# Patient Record
Sex: Female | Born: 1965 | Race: White | Hispanic: No | Smoking: Current every day smoker
Health system: Southern US, Community
[De-identification: ages and names within clinical notes are randomized; demographics above are authoritative.]

## PROBLEM LIST (undated history)

## (undated) DIAGNOSIS — K589 Irritable bowel syndrome without diarrhea: Secondary | ICD-10-CM

## (undated) DIAGNOSIS — J449 Chronic obstructive pulmonary disease, unspecified: Secondary | ICD-10-CM

## (undated) DIAGNOSIS — F32A Depression, unspecified: Secondary | ICD-10-CM

## (undated) DIAGNOSIS — F329 Major depressive disorder, single episode, unspecified: Secondary | ICD-10-CM

## (undated) DIAGNOSIS — B029 Zoster without complications: Secondary | ICD-10-CM

## (undated) DIAGNOSIS — R569 Unspecified convulsions: Secondary | ICD-10-CM

## (undated) DIAGNOSIS — F419 Anxiety disorder, unspecified: Secondary | ICD-10-CM

## (undated) DIAGNOSIS — IMO0002 Reserved for concepts with insufficient information to code with codable children: Secondary | ICD-10-CM

## (undated) DIAGNOSIS — B977 Papillomavirus as the cause of diseases classified elsewhere: Secondary | ICD-10-CM

## (undated) HISTORY — DX: Zoster without complications: B02.9

## (undated) HISTORY — PX: ABDOMINAL HYSTERECTOMY: SHX81

## (undated) HISTORY — DX: Papillomavirus as the cause of diseases classified elsewhere: B97.7

## (undated) HISTORY — DX: Reserved for concepts with insufficient information to code with codable children: IMO0002

## (undated) HISTORY — DX: Unspecified convulsions: R56.9

## (undated) HISTORY — DX: Irritable bowel syndrome, unspecified: K58.9

---

## 1986-04-11 HISTORY — PX: TUBAL LIGATION: SHX77

## 2000-08-20 ENCOUNTER — Encounter: Payer: Self-pay | Admitting: *Deleted

## 2000-08-20 ENCOUNTER — Emergency Department (HOSPITAL_COMMUNITY): Admission: EM | Admit: 2000-08-20 | Discharge: 2000-08-20 | Payer: Self-pay | Admitting: *Deleted

## 2001-03-20 ENCOUNTER — Encounter: Payer: Self-pay | Admitting: Family Medicine

## 2001-03-20 ENCOUNTER — Ambulatory Visit (HOSPITAL_COMMUNITY): Admission: RE | Admit: 2001-03-20 | Discharge: 2001-03-20 | Payer: Self-pay | Admitting: Family Medicine

## 2004-02-10 DIAGNOSIS — B977 Papillomavirus as the cause of diseases classified elsewhere: Secondary | ICD-10-CM

## 2004-02-10 HISTORY — DX: Papillomavirus as the cause of diseases classified elsewhere: B97.7

## 2004-03-10 ENCOUNTER — Ambulatory Visit (HOSPITAL_COMMUNITY): Admission: RE | Admit: 2004-03-10 | Discharge: 2004-03-10 | Payer: Self-pay | Admitting: Family Medicine

## 2004-04-11 HISTORY — PX: PARTIAL HYSTERECTOMY: SHX80

## 2004-06-16 ENCOUNTER — Ambulatory Visit (HOSPITAL_COMMUNITY): Admission: RE | Admit: 2004-06-16 | Discharge: 2004-06-16 | Payer: Self-pay | Admitting: Family Medicine

## 2004-06-30 ENCOUNTER — Ambulatory Visit: Payer: Self-pay | Admitting: Psychology

## 2004-07-06 ENCOUNTER — Other Ambulatory Visit: Admission: RE | Admit: 2004-07-06 | Discharge: 2004-07-06 | Payer: Self-pay | Admitting: Obstetrics and Gynecology

## 2004-08-19 ENCOUNTER — Ambulatory Visit: Payer: Self-pay | Admitting: Psychology

## 2004-08-27 ENCOUNTER — Other Ambulatory Visit: Admission: RE | Admit: 2004-08-27 | Discharge: 2004-08-27 | Payer: Self-pay | Admitting: Obstetrics and Gynecology

## 2004-10-25 ENCOUNTER — Ambulatory Visit: Payer: Self-pay | Admitting: Psychology

## 2004-12-01 ENCOUNTER — Ambulatory Visit: Payer: Self-pay | Admitting: Psychology

## 2004-12-23 ENCOUNTER — Inpatient Hospital Stay (HOSPITAL_COMMUNITY): Admission: RE | Admit: 2004-12-23 | Discharge: 2004-12-24 | Payer: Self-pay | Admitting: Obstetrics and Gynecology

## 2004-12-23 ENCOUNTER — Encounter: Payer: Self-pay | Admitting: Obstetrics and Gynecology

## 2005-02-11 ENCOUNTER — Ambulatory Visit: Payer: Self-pay | Admitting: Psychology

## 2005-03-14 ENCOUNTER — Ambulatory Visit: Payer: Self-pay | Admitting: Psychology

## 2005-05-03 ENCOUNTER — Ambulatory Visit: Payer: Self-pay | Admitting: Psychology

## 2006-03-15 ENCOUNTER — Emergency Department (HOSPITAL_COMMUNITY): Admission: EM | Admit: 2006-03-15 | Discharge: 2006-03-15 | Payer: Self-pay | Admitting: Emergency Medicine

## 2009-08-25 ENCOUNTER — Ambulatory Visit (HOSPITAL_COMMUNITY): Admission: RE | Admit: 2009-08-25 | Discharge: 2009-08-25 | Payer: Self-pay | Admitting: Family Medicine

## 2010-08-02 ENCOUNTER — Other Ambulatory Visit (HOSPITAL_COMMUNITY): Payer: Self-pay | Admitting: Family Medicine

## 2010-08-02 DIAGNOSIS — Z139 Encounter for screening, unspecified: Secondary | ICD-10-CM

## 2010-08-09 ENCOUNTER — Ambulatory Visit (HOSPITAL_COMMUNITY)
Admission: RE | Admit: 2010-08-09 | Discharge: 2010-08-09 | Disposition: A | Discharge status: Home / Self Care | Payer: Self-pay | Source: Ambulatory Visit | Attending: Family Medicine | Admitting: Family Medicine

## 2010-08-09 DIAGNOSIS — Z139 Encounter for screening, unspecified: Secondary | ICD-10-CM

## 2010-08-27 NOTE — Discharge Summary (Signed)
[name], [name]              ACCOUNT NO.:  [id number]   MEDICAL RECORD NO.:  [id number]          PATIENT TYPE:  INP   LOCATION:  A428                          FACILITY:  [facility]   PHYSICIAN:  Tilda Burrow, M.D. DATE OF BIRTH:  [birth year]   DATE OF ADMISSION:  12/23/2004  DATE OF DISCHARGE:  09/15/2006LH                                 DISCHARGE SUMMARY   PREOPERATIVE DIAGNOSIS:  Residual cervical dysplasia, endocervical  involvement.   DISCHARGE DIAGNOSES:  Residual cervical dysplasia, endocervical involvement.   PROCEDURES:  Vaginal hysterectomy.   DISCHARGE MEDICATIONS:  1.  Tylox 1 p.o. q.4 h p.r.n. pain.  2.  Colace 1 p.o. b.i.d. x30 days.   HOSPITAL SUMMARY:  This 45 year old female was admitted for vaginal  hysterectomy due to persistent endocervical dysplasia after LEEP conization  of the cervix.  The patient underwent vaginal hysterectomy with 75 cc blood  loss as described in the operative not of December 23, 2004.  Pathology  report showed low grade dysplasia only of the endocervical sample.  The  patient was afebrile, tolerated regular diet with adequate pain relief to go  home on postoperative day #1.  Routine post surgical instructions included  no driving x2 weeks, no lifting over 15 pounds and follow up period, fever,  temperature, deterioration of condition.      Tilda Burrow, M.D.  Electronically Signed     JVF/MEDQ  D:  12/30/2004  T:  12/31/2004  Job:  [id number]   cc:   Lorin Picket A. Gerda Diss, MD  Fax: [phone]

## 2010-08-27 NOTE — H&P (Signed)
[name], [name]              ACCOUNT NO.:  [id number]   MEDICAL RECORD NO.:  [id number]          PATIENT TYPE:  AMB   LOCATION:  DAY                           FACILITY:  [facility]   PHYSICIAN:  Tilda Burrow, M.D. DATE OF BIRTH:  19-Aug-1965   DATE OF ADMISSION:  DATE OF DISCHARGE:  LH                                HISTORY & PHYSICAL   ADMITTING DIAGNOSIS:  Recurrent cervical dysplasia, endocervical  involvement.   HISTORY OF PRESENT ILLNESS:  This 45 year old female is admitted at this  time for a vaginal hysterectomy due to persistent cervical dysplasia.  The  patient has had a prior conization with evaluation in our office showing  persistent abnormality at this time.  The dysplasia persists and goes up the  endocervical canal, and wound necessitate a deeper excision of the cervix.  She had originally undergone a LEEP conization in the office earlier this  year, but despite a specimen measured at 2 cm x 1.7 cm in diameter, the  endocervical involvement was noted on the specimen.  The patient has been  seen back and confirmed that this dysplasia still exists and was moderate  dysplasia on prior evaluation.  Discussion of options of proceeding to  repeat conization versus hysterectomy with the patient strongly desirous of  hysterectomy as her solution.  She complains of cramps with her periods, not  sufficient to her require missing work.  She has a high apprehension of  cancer due to family history.  Significant other family history is that the  mother died at [facility] during back surgery in 2003 due to an  intraoperative accident with an artificial disk was described as being  inserted too deep and tearing the vena cava.  The patient is naturally  apprehensive about the anesthetic aspects of what we are doing.  I have  drawn diagrams of the anatomy to help her understand the surgery and the  risks and benefits.   PAST MEDICAL HISTORY:  Benign.   SURGICAL HISTORY:   Tubal ligation in 1988.   ALLERGIES:  AMPICILLIN.   MEDICATIONS:  1.  Celexa daily.  2.  Xanax p.r.n.   PHYSICAL EXAMINATION:  VITAL SIGNS:  Height 5 feet, 5 inches, weight 103,  blood pressure 102/60.  GENERAL:  A slim Caucasian female, alert and oriented x3.  Pupils equal,  round and reactive to light.  Extraocular movements intact.  NECK:  Supple.  CHEST:  Clear to auscultation.  ABDOMEN:  Nontender.  PELVIC:  External genitalia multiparous.  Vaginal exam - short vaginal  length.  Cervix easily accessible.  Felt amenable to hysterectomy per  vagina, and the uterus is mobile, normal size, shape, and contour.  Adnexa  negative for masses.   PLAN:  Vaginal hysterectomy without removal of ovaries to be performed  December 23, 2004.   Risks of proceed including infection, bleeding, potential injury to adjacent  organs were all discussed at length with the patient using visual guiders by  [employer].  The patient understands and acknowledges understanding.      Tilda Burrow, M.D.  Electronically Signed  JVF/MEDQ  D:  12/22/2004  T:  12/22/2004  Job:  [id number]   cc:   Lorin Picket A. Gerda Diss, MD  [address], [address]  [place]  [place] [address]  Fax: [phone]

## 2010-08-27 NOTE — Op Note (Signed)
[name], [name]              ACCOUNT NO.:  [id number]   MEDICAL RECORD NO.:  [id number]          PATIENT TYPE:  INP   LOCATION:  A428                          FACILITY:  [facility]   PHYSICIAN:  Tilda Burrow, M.D. DATE OF BIRTH:  Apr 02, 1966   DATE OF PROCEDURE:  12/23/2004  DATE OF DISCHARGE:                                 OPERATIVE REPORT   PREOPERATIVE DIAGNOSIS:  Residual cervical dysplasia, endocervical  involvement.   POSTOPERATIVE DIAGNOSIS:  1.  Residual cervical dysplasia, endocervical involvement.  2.  Left ovarian hemorrhagic cyst, considered benign.   PROCEDURE:  Vaginal hysterectomy.   SURGEON:  Tilda Burrow, M.D.   ASSISTANTMarlinda Mike, RN. ___________, CST.  [place]well, CST.   ANESTHESIA:  General.   SPECIMENS:  Uterus to pathology.   ESTIMATED BLOOD LOSS:  75 cc.   COMPLICATIONS:  None.   FINDINGS:  Excellent hemostasis. Hemorrhagic cyst, left ovary.   DETAILS OF PROCEDURE:  The patient was taken to the operating room, prepped  and draped for vaginal work. Cervix was grasped with a Lahey thyroid  tenaculum. The 3-inch vaginal weighted speculum placed in the vagina and the  cervix circumscribed with Bovie cautery using cutting settings and then the  bladder elevated anteriorly. Cervix was rather elongate, so we were not able  to actually reach the peritoneum anteriorly. Posterior colpotomy incision  was made, again making note of the very long cervix. Entry to the peritoneal  cavity was well placed, and then a curved Heaney clamp placed over the left  uterosacral ligaments with cutting and suture ligating performed and tagging  the specimen for future identification. The lower cardinal ligaments on  either side, we were able to readdress the bladder flap and identify the  lower most portion of the vesicouterine narrowing. We were able to march up  the upper cardinal ligament and broad ligament, taking small bites with  straight Zeppelin clamp, Mayo  scissors transection, and 0 chromic suture  ligature. Once we were into the broad ligament, the cervix was amputated off  and the specimen tagged at 12 o'clock.   The upper body of the uterus was serially clamped, cut, and suture ligated  using 0 chromic after Mayo scissor transection. Once we reached the cornual  area, the ligament pedicle could be clamped, cut and suture ligated on  either side. The specimen was passed off. The pedicles were inspected and  found to be hemostatic except for a small bit of oozing in the area of the  lower cardinal ligament on the left side which we had sutured.   The pelvic structures were visualized through the opening, and no  endometriosis could be identified. There was a small, barely attached,  formally free floating body that had blood in it such as epithelized old  clot.   We then proceeded to inspect the ovaries visually. The right ovary was well  retracted; large, hemorrhagic cyst on its surface approximately 3 cm in  diameter which was opened sufficiently to expel which was old, dark in  character and vac suctioned out. Ovary was decompressed to normal size.  The  possibility that this represents an endometrioma exists, there was no clear  way to know. The absence of uterosacral ligament endometriosis was noted.   The peritoneum was reapproximated front to back on either side, closing the  peritoneal cavity with sponge and needle counts correct. Vaginal cuff was  then closed using running locking 2-0 chromic with good tissue edge  approximation, and patient then was allowed to go to recovery room in good  condition. Foley catheter was inserted prior to leaving the area. A vaginal  pack was not necessary. The patient tolerated the procedure well with  minimal blood loss of 75 cc.      Tilda Burrow, M.D.  Electronically Signed     JVF/MEDQ  D:  12/23/2004  T:  12/23/2004  Job:  [id number]   cc:   [facility]   Scott A. Gerda Diss,  MD  [address], [address]  [place]  [place] [address]  Fax: [phone]

## 2011-01-21 ENCOUNTER — Other Ambulatory Visit: Payer: Self-pay

## 2011-01-21 ENCOUNTER — Encounter: Payer: Self-pay | Admitting: Oncology

## 2011-01-21 ENCOUNTER — Emergency Department (HOSPITAL_COMMUNITY)
Admission: EM | Admit: 2011-01-21 | Discharge: 2011-01-21 | Disposition: A | Discharge status: Home / Self Care | Payer: Self-pay | Attending: Emergency Medicine | Admitting: Emergency Medicine

## 2011-01-21 DIAGNOSIS — W868XXA Exposure to other electric current, initial encounter: Secondary | ICD-10-CM | POA: Insufficient documentation

## 2011-01-21 DIAGNOSIS — T2036XA Burn of third degree of forehead and cheek, initial encounter: Secondary | ICD-10-CM | POA: Insufficient documentation

## 2011-01-21 DIAGNOSIS — T23329A Burn of third degree of unspecified single finger (nail) except thumb, initial encounter: Secondary | ICD-10-CM | POA: Insufficient documentation

## 2011-01-21 DIAGNOSIS — IMO0002 Reserved for concepts with insufficient information to code with codable children: Secondary | ICD-10-CM

## 2011-01-21 DIAGNOSIS — Y92009 Unspecified place in unspecified non-institutional (private) residence as the place of occurrence of the external cause: Secondary | ICD-10-CM | POA: Insufficient documentation

## 2011-01-21 HISTORY — DX: Chronic obstructive pulmonary disease, unspecified: J44.9

## 2011-01-21 HISTORY — DX: Major depressive disorder, single episode, unspecified: F32.9

## 2011-01-21 HISTORY — DX: Depression, unspecified: F32.A

## 2011-01-21 HISTORY — DX: Anxiety disorder, unspecified: F41.9

## 2011-01-21 LAB — DIFFERENTIAL
Basophils Relative: 0 % (ref 0–1)
Eosinophils Absolute: 0.1 10*3/uL (ref 0.0–0.7)
Lymphocytes Relative: 15 % (ref 12–46)
Lymphs Abs: 1.8 10*3/uL (ref 0.7–4.0)
Monocytes Absolute: 1.1 10*3/uL — ABNORMAL HIGH (ref 0.1–1.0)
Monocytes Relative: 9 % (ref 3–12)
Neutro Abs: 9.4 10*3/uL — ABNORMAL HIGH (ref 1.7–7.7)
Neutrophils Relative %: 76 % (ref 43–77)

## 2011-01-21 LAB — CBC
HCT: 35.7 % — ABNORMAL LOW (ref 36.0–46.0)
Hemoglobin: 12.2 g/dL (ref 12.0–15.0)
MCH: 33.5 pg (ref 26.0–34.0)
MCV: 98.1 fL (ref 78.0–100.0)
RBC: 3.64 MIL/uL — ABNORMAL LOW (ref 3.87–5.11)
RDW: 12.3 % (ref 11.5–15.5)
WBC: 12.4 10*3/uL — ABNORMAL HIGH (ref 4.0–10.5)

## 2011-01-21 LAB — BASIC METABOLIC PANEL
BUN: 14 mg/dL (ref 6–23)
Creatinine, Ser: 0.69 mg/dL (ref 0.50–1.10)
Potassium: 3.4 mEq/L — ABNORMAL LOW (ref 3.5–5.1)

## 2011-01-21 MED ORDER — SODIUM CHLORIDE 0.9 % IJ SOLN: 3.0000 mL | INTRAMUSCULAR | Status: DC | PRN

## 2011-01-21 MED ORDER — SODIUM CHLORIDE 0.9 % IV SOLN: Freq: Once | INTRAVENOUS | Status: DC
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
## 2012-01-23 ENCOUNTER — Other Ambulatory Visit: Payer: Self-pay | Admitting: Family Medicine

## 2012-01-23 DIAGNOSIS — R200 Anesthesia of skin: Secondary | ICD-10-CM

## 2012-01-30 ENCOUNTER — Ambulatory Visit (HOSPITAL_COMMUNITY)
Admission: RE | Admit: 2012-01-30 | Discharge: 2012-01-30 | Disposition: A | Discharge status: Home / Self Care | Payer: BC Managed Care – PPO | Source: Ambulatory Visit | Attending: Family Medicine | Admitting: Family Medicine

## 2012-01-30 DIAGNOSIS — R200 Anesthesia of skin: Secondary | ICD-10-CM

## 2012-01-30 DIAGNOSIS — R209 Unspecified disturbances of skin sensation: Secondary | ICD-10-CM | POA: Insufficient documentation

## 2012-05-02 ENCOUNTER — Ambulatory Visit (INDEPENDENT_AMBULATORY_CARE_PROVIDER_SITE_OTHER): Payer: BC Managed Care – PPO | Admitting: Psychology

## 2012-05-02 DIAGNOSIS — F332 Major depressive disorder, recurrent severe without psychotic features: Secondary | ICD-10-CM

## 2012-05-22 ENCOUNTER — Encounter (HOSPITAL_COMMUNITY): Payer: Self-pay | Admitting: Psychology
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
## 2012-05-23 ENCOUNTER — Ambulatory Visit (HOSPITAL_COMMUNITY): Payer: Self-pay | Admitting: Psychology

## 2012-05-30 ENCOUNTER — Other Ambulatory Visit (HOSPITAL_COMMUNITY): Payer: Self-pay

## 2012-05-30 DIAGNOSIS — R0689 Other abnormalities of breathing: Secondary | ICD-10-CM

## 2012-05-31 ENCOUNTER — Ambulatory Visit (HOSPITAL_COMMUNITY): Payer: Self-pay | Admitting: Psychology

## 2012-06-05 ENCOUNTER — Encounter (HOSPITAL_COMMUNITY): Payer: Self-pay | Admitting: Psychology

## 2012-06-05 ENCOUNTER — Ambulatory Visit (INDEPENDENT_AMBULATORY_CARE_PROVIDER_SITE_OTHER): Payer: BC Managed Care – PPO | Admitting: Psychology

## 2012-06-05 DIAGNOSIS — F332 Major depressive disorder, recurrent severe without psychotic features: Secondary | ICD-10-CM
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
## 2012-06-12 ENCOUNTER — Ambulatory Visit (HOSPITAL_COMMUNITY)
Admission: RE | Admit: 2012-06-12 | Discharge: 2012-06-12 | Disposition: A | Discharge status: Home / Self Care | Payer: BC Managed Care – PPO | Source: Ambulatory Visit | Attending: Family Medicine | Admitting: Family Medicine

## 2012-06-12 DIAGNOSIS — R0602 Shortness of breath: Secondary | ICD-10-CM | POA: Insufficient documentation

## 2012-06-12 MED ORDER — ALBUTEROL SULFATE (5 MG/ML) 0.5% IN NEBU
2.5000 mg | INHALATION_SOLUTION | Freq: Once | RESPIRATORY_TRACT | Status: AC
  Administered 2012-06-12: 2.5 mg via RESPIRATORY_TRACT

## 2012-06-19 ENCOUNTER — Ambulatory Visit (HOSPITAL_COMMUNITY): Payer: Self-pay | Admitting: Psychology

## 2012-06-19 LAB — PULMONARY FUNCTION TEST

## 2012-06-20 ENCOUNTER — Ambulatory Visit (INDEPENDENT_AMBULATORY_CARE_PROVIDER_SITE_OTHER): Payer: BC Managed Care – PPO | Admitting: Psychology

## 2012-06-20 DIAGNOSIS — F332 Major depressive disorder, recurrent severe without psychotic features: Secondary | ICD-10-CM
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
## 2012-06-26 ENCOUNTER — Encounter: Payer: Self-pay | Admitting: Family Medicine

## 2012-06-26 ENCOUNTER — Ambulatory Visit (INDEPENDENT_AMBULATORY_CARE_PROVIDER_SITE_OTHER): Payer: BC Managed Care – PPO | Admitting: Family Medicine

## 2012-06-26 VITALS — BP 130/86 | Temp 98.4°F | Wt 99.6 lb

## 2012-06-26 DIAGNOSIS — R42 Dizziness and giddiness: Secondary | ICD-10-CM

## 2012-06-26 DIAGNOSIS — R634 Abnormal weight loss: Secondary | ICD-10-CM

## 2012-06-26 NOTE — Progress Notes (Signed)
  Subjective:    Patient ID: [name], female    DOB: 1965/10/23, 47 y.o.   MRN: [id number]  HPI This patient presents today stating that she's been having some spells of dizziness. She feels like it to wave that comes across to her. Makes her feel uncertain and unsteady. She states her last a few seconds then gets better. A couple times it happened when she was driving she pulled over. She does not lose consciousness no headache with it no unilateral numbness or weakness. No sweats chills fever vomiting. No headaches. Patient is also been under severe stress. She's been very anxious and feeling very depressed upon and worried. She takes care of her dadand I are in a sterile is no a long he remained in it all and on a regular basis. Patient is under the care of Dr. Minda Meo for depression we have her on Cymbalta she only uses the Xanax at nighttime. Patient does have a history of depression does not have any history of stroke. 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   Review of Systems     Objective:   Physical Exam  Neck: Normal range of motion. No thyromegaly present.  Cardiovascular: Normal rate and regular rhythm.   Pulmonary/Chest: Effort normal and breath sounds normal. No respiratory distress. She has no wheezes.  Abdominal: Soft. There is no tenderness.   Finger to nose is normal EOMI. Negative Romberg. No unilateral numbness or weakness. Patient very tearful but she states she's not suicidal.       Assessment & Plan:  Loss of weight - Plan: T4, free, TSH  Dizziness and giddiness  I believe this patient is suffering mainly with severe symptoms related to her depression and her anxiety. I told her that it's very important that she figure out a way for her day had to be more independent or to have home health or a sitter come in and take care of her dad. If she wants her marriage to work she needs to be with her husband and not being her dad's caretaker when it sounds like  her dad could take care of himself better. Let's see the patient back in 4 months. She may followup sooner problems.

## 2012-06-26 NOTE — Patient Instructions (Signed)
Take your medications Do your lab work

## 2012-06-28 LAB — TSH: TSH: 0.742 u[IU]/mL (ref 0.350–4.500)

## 2012-06-28 NOTE — Progress Notes (Signed)
Quick Note:  Discussed with patient. Patient verbalized understanding. ______

## 2012-07-04 ENCOUNTER — Ambulatory Visit (INDEPENDENT_AMBULATORY_CARE_PROVIDER_SITE_OTHER): Payer: BC Managed Care – PPO | Admitting: Psychology

## 2012-07-04 DIAGNOSIS — F332 Major depressive disorder, recurrent severe without psychotic features: Secondary | ICD-10-CM

## 2012-07-09 ENCOUNTER — Encounter (HOSPITAL_COMMUNITY): Payer: Self-pay | Admitting: Psychology
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
## 2012-07-18 ENCOUNTER — Encounter (HOSPITAL_COMMUNITY): Payer: Self-pay | Admitting: Psychology
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
## 2012-07-31 ENCOUNTER — Encounter: Payer: Self-pay | Admitting: *Deleted

## 2012-08-01 ENCOUNTER — Ambulatory Visit: Payer: BC Managed Care – PPO | Admitting: Family Medicine

## 2012-08-20 ENCOUNTER — Ambulatory Visit (INDEPENDENT_AMBULATORY_CARE_PROVIDER_SITE_OTHER): Payer: BC Managed Care – PPO | Admitting: Psychology

## 2012-08-20 DIAGNOSIS — F332 Major depressive disorder, recurrent severe without psychotic features: Secondary | ICD-10-CM

## 2012-09-07 ENCOUNTER — Encounter (HOSPITAL_COMMUNITY): Payer: Self-pay | Admitting: Psychology
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
## 2012-09-17 ENCOUNTER — Ambulatory Visit (HOSPITAL_COMMUNITY): Payer: Self-pay | Admitting: Psychology

## 2012-09-21 ENCOUNTER — Ambulatory Visit (INDEPENDENT_AMBULATORY_CARE_PROVIDER_SITE_OTHER): Payer: BC Managed Care – PPO | Admitting: Psychology

## 2012-09-21 DIAGNOSIS — F332 Major depressive disorder, recurrent severe without psychotic features: Secondary | ICD-10-CM

## 2012-10-15 ENCOUNTER — Other Ambulatory Visit: Payer: Self-pay | Admitting: Family Medicine

## 2012-10-15 NOTE — Telephone Encounter (Signed)
Refill times one, will need further follow up before more

## 2012-10-22 ENCOUNTER — Ambulatory Visit (INDEPENDENT_AMBULATORY_CARE_PROVIDER_SITE_OTHER): Payer: BC Managed Care – PPO | Admitting: Family Medicine

## 2012-10-22 ENCOUNTER — Ambulatory Visit (HOSPITAL_COMMUNITY): Payer: Self-pay | Admitting: Psychology

## 2012-10-22 ENCOUNTER — Encounter: Payer: Self-pay | Admitting: Family Medicine

## 2012-10-22 VITALS — BP 110/70 | HR 80 | Ht 67.0 in | Wt 101.2 lb

## 2012-10-22 DIAGNOSIS — F411 Generalized anxiety disorder: Secondary | ICD-10-CM

## 2012-10-22 DIAGNOSIS — F419 Anxiety disorder, unspecified: Secondary | ICD-10-CM | POA: Insufficient documentation

## 2012-10-22 DIAGNOSIS — R42 Dizziness and giddiness: Secondary | ICD-10-CM | POA: Insufficient documentation

## 2012-10-22 MED ORDER — ALPRAZOLAM 0.5 MG PO TABS: ORAL_TABLET | ORAL | Status: DC

## 2012-10-22 NOTE — Progress Notes (Signed)
  Subjective:    Patient ID: [name], female    DOB: [birth year], 47 y.o.   MRN: [id number]  Anxiety Presents for follow-up visit. Symptoms include dizziness. Patient reports no chest pain or shortness of breath. Symptoms occur occasionally. The severity of symptoms is moderate. The quality of sleep is fair.   Compliance with medications is 76-100%.   patient's intermittent dizziness comes and goes she feels off-balance she denies any unilateral numbness or weakness denies any muscle weakness denies any headaches with it. Does not eat properly. Encouraged eating Still smokes- trying to quit. Gets dizzy at times. She doesn't eat well. Very busy with work and taking care of her dad. She lives with him.  Review of Systems  Constitutional: Negative for fever, chills and fatigue.  HENT: Negative for congestion.   Respiratory: Negative for choking and shortness of breath.   Cardiovascular: Negative for chest pain.  Neurological: Positive for dizziness.       Objective:   Physical Exam  Vitals reviewed. Constitutional: She appears well-developed.  thin  HENT:  Head: Normocephalic and atraumatic.  Neck: No thyromegaly present.  Cardiovascular: Normal rate, regular rhythm and normal heart sounds.   Pulmonary/Chest: Effort normal and breath sounds normal.  Lymphadenopathy:    She has no cervical adenopathy.          Assessment & Plan:  Anxiety- continue meds as is f/u in 5 months Dietary poor, eat better Quit smoking Dizziness-I. think this is a byproduct of not eating properly could be related to her anxiety. There is no findings of any unilateral aspect consistent with a stroke

## 2012-10-22 NOTE — Patient Instructions (Signed)
Smoking Cessation Quitting smoking is important to your health and has many advantages. However, it is not always easy to quit since nicotine is a very addictive drug. Often times, people try 3 times or more before being able to quit. This document explains the best ways for you to prepare to quit smoking. Quitting takes hard work and a lot of effort, but you can do it. ADVANTAGES OF QUITTING SMOKING  You will live longer, feel better, and live better.  Your body will feel the impact of quitting smoking almost immediately.  Within 20 minutes, blood pressure decreases. Your pulse returns to its normal level.  After 8 hours, carbon monoxide levels in the blood return to normal. Your oxygen level increases.  After 24 hours, the chance of having a heart attack starts to decrease. Your breath, hair, and body stop smelling like smoke.  After 48 hours, damaged nerve endings begin to recover. Your sense of taste and smell improve.  After 72 hours, the body is virtually free of nicotine. Your bronchial tubes relax and breathing becomes easier.  After 2 to 12 weeks, lungs can hold more air. Exercise becomes easier and circulation improves.  The risk of having a heart attack, stroke, cancer, or lung disease is greatly reduced.  After 1 year, the risk of coronary heart disease is cut in half.  After 5 years, the risk of stroke falls to the same as a nonsmoker.  After 10 years, the risk of lung cancer is cut in half and the risk of other cancers decreases significantly.  After 15 years, the risk of coronary heart disease drops, usually to the level of a nonsmoker.  If you are pregnant, quitting smoking will improve your chances of having a healthy baby.  The people you live with, especially any children, will be healthier.  You will have extra money to spend on things other than cigarettes. QUESTIONS TO THINK ABOUT BEFORE ATTEMPTING TO QUIT You may want to talk about your answers with your  caregiver.  Why do you want to quit?  If you tried to quit in the past, what helped and what did not?  What will be the most difficult situations for you after you quit? How will you plan to handle them?  Who can help you through the tough times? Your family? Friends? A caregiver?  What pleasures do you get from smoking? What ways can you still get pleasure if you quit? Here are some questions to ask your caregiver:  How can you help me to be successful at quitting?  What medicine do you think would be best for me and how should I take it?  What should I do if I need more help?  What is smoking withdrawal like? How can I get information on withdrawal? GET READY  Set a quit date.  Change your environment by getting rid of all cigarettes, ashtrays, matches, and lighters in your home, car, or work. Do not let people smoke in your home.  Review your past attempts to quit. Think about what worked and what did not. GET SUPPORT AND ENCOURAGEMENT You have a better chance of being successful if you have help. You can get support in many ways.  Tell your family, friends, and co-workers that you are going to quit and need their support. Ask them not to smoke around you.  Get individual, group, or telephone counseling and support. Programs are available at local hospitals and health centers. Call your local health department for   information about programs in your area.  Spiritual beliefs and practices may help some smokers quit.  Download a "quit meter" on your computer to keep track of quit statistics, such as how long you have gone without smoking, cigarettes not smoked, and money saved.  Get a self-help book about quitting smoking and staying off of tobacco. LEARN NEW SKILLS AND BEHAVIORS  Distract yourself from urges to smoke. Talk to someone, go for a walk, or occupy your time with a task.  Change your normal routine. Take a different route to work. Drink tea instead of coffee.  Eat breakfast in a different place.  Reduce your stress. Take a hot bath, exercise, or read a book.  Plan something enjoyable to do every day. Reward yourself for not smoking.  Explore interactive web-based programs that specialize in helping you quit. GET MEDICINE AND USE IT CORRECTLY Medicines can help you stop smoking and decrease the urge to smoke. Combining medicine with the above behavioral methods and support can greatly increase your chances of successfully quitting smoking.  Nicotine replacement therapy helps deliver nicotine to your body without the negative effects and risks of smoking. Nicotine replacement therapy includes nicotine gum, lozenges, inhalers, nasal sprays, and skin patches. Some may be available over-the-counter and others require a prescription.  Antidepressant medicine helps people abstain from smoking, but how this works is unknown. This medicine is available by prescription.  Nicotinic receptor partial agonist medicine simulates the effect of nicotine in your brain. This medicine is available by prescription. Ask your caregiver for advice about which medicines to use and how to use them based on your health history. Your caregiver will tell you what side effects to look out for if you choose to be on a medicine or therapy. Carefully read the information on the package. Do not use any other product containing nicotine while using a nicotine replacement product.  RELAPSE OR DIFFICULT SITUATIONS Most relapses occur within the first 3 months after quitting. Do not be discouraged if you start smoking again. Remember, most people try several times before finally quitting. You may have symptoms of withdrawal because your body is used to nicotine. You may crave cigarettes, be irritable, feel very hungry, cough often, get headaches, or have difficulty concentrating. The withdrawal symptoms are only temporary. They are strongest when you first quit, but they will go away within  10 14 days. To reduce the chances of relapse, try to:  Avoid drinking alcohol. Drinking lowers your chances of successfully quitting.  Reduce the amount of caffeine you consume. Once you quit smoking, the amount of caffeine in your body increases and can give you symptoms, such as a rapid heartbeat, sweating, and anxiety.  Avoid smokers because they can make you want to smoke.  Do not let weight gain distract you. Many smokers will gain weight when they quit, usually less than 10 pounds. Eat a healthy diet and stay active. You can always lose the weight gained after you quit.  Find ways to improve your mood other than smoking. FOR MORE INFORMATION  www.smokefree.gov  Document Released: 03/22/2001 Document Revised: 09/27/2011 Document Reviewed: 07/07/2011 [employer] Patient Information 2014 [employer].  

## 2012-10-29 ENCOUNTER — Other Ambulatory Visit: Payer: Self-pay

## 2012-10-29 ENCOUNTER — Telehealth: Payer: Self-pay | Admitting: Family Medicine

## 2012-10-29 DIAGNOSIS — R42 Dizziness and giddiness: Secondary | ICD-10-CM

## 2012-10-29 NOTE — Telephone Encounter (Signed)
Pt calling stating she is still having dizzy spells, what do you recommend?

## 2012-10-29 NOTE — Telephone Encounter (Signed)
Notified patient the next step in this process would be considering neurology consultation. Patient agreed. Initiated referral in system.

## 2012-10-29 NOTE — Telephone Encounter (Signed)
The next step in this process would be considering neurology consultation. See if the patient is interested if so we can help set this up.

## 2012-11-02 ENCOUNTER — Ambulatory Visit (INDEPENDENT_AMBULATORY_CARE_PROVIDER_SITE_OTHER): Payer: BC Managed Care – PPO | Admitting: Psychology

## 2012-11-02 DIAGNOSIS — F332 Major depressive disorder, recurrent severe without psychotic features: Secondary | ICD-10-CM

## 2012-11-05 ENCOUNTER — Encounter (HOSPITAL_COMMUNITY): Payer: Self-pay | Admitting: Psychology
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
## 2012-11-28 ENCOUNTER — Ambulatory Visit (HOSPITAL_COMMUNITY): Payer: Self-pay | Admitting: Psychology

## 2012-12-19 ENCOUNTER — Encounter: Payer: Self-pay | Admitting: Nurse Practitioner

## 2012-12-19 ENCOUNTER — Encounter (HOSPITAL_COMMUNITY): Payer: Self-pay | Admitting: Psychology

## 2012-12-19 ENCOUNTER — Ambulatory Visit (INDEPENDENT_AMBULATORY_CARE_PROVIDER_SITE_OTHER): Payer: BC Managed Care – PPO | Admitting: Nurse Practitioner

## 2012-12-19 VITALS — BP 112/68 | Ht 67.0 in | Wt 101.6 lb

## 2012-12-19 DIAGNOSIS — Z0000 Encounter for general adult medical examination without abnormal findings: Secondary | ICD-10-CM

## 2012-12-19 DIAGNOSIS — J449 Chronic obstructive pulmonary disease, unspecified: Secondary | ICD-10-CM

## 2012-12-19 DIAGNOSIS — Z79899 Other long term (current) drug therapy: Secondary | ICD-10-CM

## 2012-12-19 DIAGNOSIS — R5381 Other malaise: Secondary | ICD-10-CM

## 2012-12-19 DIAGNOSIS — Z01419 Encounter for gynecological examination (general) (routine) without abnormal findings: Secondary | ICD-10-CM
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
## 2012-12-19 NOTE — Progress Notes (Signed)
  Subjective:    Patient ID: [name], female    DOB: [birth year], 47 y.o.   MRN: [id number]  HPI presents for wellness checkup. Married, same sexual partner. No vaginal discharge. No pelvic pain. Has been diagnosed with COPD. Does not feel she can stop smoking at this time due to her level of anxiety. Regular dental care. Regular vision care. Has had increased shortness of breath over time. No chest pain or ischemic type pain. Her father has had an MI, several paternal uncles with history of stroke.    Review of Systems  Constitutional: Positive for fatigue. Negative for fever, activity change and appetite change.  HENT: Negative for hearing loss, ear pain, congestion, sore throat, rhinorrhea and dental problem.   Respiratory: Positive for cough and shortness of breath. Negative for chest tightness and wheezing.   Cardiovascular: Negative for chest pain.  Gastrointestinal: Positive for diarrhea and constipation. Negative for nausea, vomiting, abdominal pain and blood in stool.  Genitourinary: Negative for dysuria, urgency, frequency, vaginal discharge, difficulty urinating and pelvic pain.  Neurological: Negative for headaches.  Psychiatric/Behavioral: Positive for dysphoric mood. Negative for sleep disturbance. The patient is nervous/anxious.        Objective:   Physical Exam  Vitals reviewed. Constitutional: She is oriented to person, place, and time. She appears well-developed. No distress.  HENT:  Right Ear: External ear normal.  Left Ear: External ear normal.  Mouth/Throat: Oropharynx is clear and moist.  Neck: Normal range of motion. Neck supple. No tracheal deviation present. No thyromegaly present.  Cardiovascular: Normal rate, regular rhythm and normal heart sounds.  Exam reveals no gallop.   No murmur heard. Pulmonary/Chest: Effort normal and breath sounds normal. No respiratory distress. She has no wheezes.  Abdominal: Soft. She exhibits no distension and no mass. There  is no tenderness.  Genitourinary: Vagina normal. No vaginal discharge found.  Musculoskeletal: She exhibits no edema.  Lymphadenopathy:    She has no cervical adenopathy.  Neurological: She is alert and oriented to person, place, and time.  Skin: Skin is warm and dry. No rash noted.  Psychiatric: She has a normal mood and affect. Her behavior is normal.   Breast exam dense tissue, no dominant masses. Axilla no adenopathy. Lungs sounds clear but mildly diminished. Normal color. No tachypnea. External GU normal, vagina no discharge. Ovaries no masses or tenderness noted.        Assessment & Plan:  Well woman exam  Encounter for long-term (current) use of other medications - Plan: Hepatic function panel, Basic metabolic panel  Routine general medical examination at a health care facility - Plan: MM Digital Screening, Lipid panel  Other malaise and fatigue - Plan: TSH  COPD (chronic obstructive pulmonary disease)  Meds ordered this encounter  Medications  . tiotropium (SPIRIVA) 18 MCG inhalation capsule    Sig: Place 1 capsule (18 mcg total) into inhaler and inhale daily.    Dispense:  30 capsule    Refill:  12    Order Specific Question:  Supervising Provider    Answer:  Merlyn Albert [[id number]]   Discussed importance of smoking cessation although patient will have a difficult time quitting at this point. Recommend vitamin D and calcium daily. Next physical in one year.

## 2012-12-21 ENCOUNTER — Telehealth: Payer: Self-pay | Admitting: Family Medicine

## 2012-12-21 NOTE — Telephone Encounter (Signed)
Patient says that she was supposed to have some medication called in for her from her visit on Wednesday with Eber Jones, but the pharmacy has not yet received anything. She would like someone to call her when this is complete.   [facility]

## 2012-12-23 ENCOUNTER — Encounter: Payer: Self-pay | Admitting: Nurse Practitioner

## 2012-12-23 DIAGNOSIS — J449 Chronic obstructive pulmonary disease, unspecified: Secondary | ICD-10-CM | POA: Insufficient documentation

## 2012-12-23 MED ORDER — TIOTROPIUM BROMIDE MONOHYDRATE 18 MCG IN CAPS: 18.0000 ug | ORAL_CAPSULE | Freq: Every day | RESPIRATORY_TRACT | Status: DC

## 2012-12-23 NOTE — Assessment & Plan Note (Signed)
Discussed importance of smoking cessation, patient feels she cannot quit at this time due to her level of anxiety. Started on the Spiriva daily as directed.

## 2012-12-23 NOTE — Telephone Encounter (Signed)
Spiriva sent in to [facility].  Use daily as directed.  Call back in a few weeks if no improvement in her breathing.  Otherwise, follow-up in 6 months.

## 2012-12-24 NOTE — Telephone Encounter (Signed)
Patient notified

## 2012-12-25 LAB — HEPATIC FUNCTION PANEL
ALT: 8 U/L (ref 0–35)
AST: 14 U/L (ref 0–37)
Alkaline Phosphatase: 36 U/L — ABNORMAL LOW (ref 39–117)
Total Protein: 6.5 g/dL (ref 6.0–8.3)

## 2012-12-25 LAB — BASIC METABOLIC PANEL
CO2: 30 mEq/L (ref 19–32)
Calcium: 9.3 mg/dL (ref 8.4–10.5)
Chloride: 103 mEq/L (ref 96–112)
Potassium: 4.2 mEq/L (ref 3.5–5.3)

## 2012-12-25 LAB — LIPID PANEL
Triglycerides: 109 mg/dL (ref <150)
VLDL: 22 mg/dL (ref 0–40)

## 2012-12-26 ENCOUNTER — Encounter: Payer: Self-pay | Admitting: Nurse Practitioner

## 2012-12-26 LAB — TSH: TSH: 1.758 u[IU]/mL (ref 0.350–4.500)

## 2013-01-07 ENCOUNTER — Ambulatory Visit (HOSPITAL_COMMUNITY): Payer: Self-pay

## 2013-01-15 ENCOUNTER — Ambulatory Visit (HOSPITAL_COMMUNITY): Payer: Self-pay | Admitting: Psychology

## 2013-01-17 ENCOUNTER — Encounter (HOSPITAL_COMMUNITY): Payer: Self-pay | Admitting: Psychology

## 2013-01-21 ENCOUNTER — Ambulatory Visit (INDEPENDENT_AMBULATORY_CARE_PROVIDER_SITE_OTHER): Payer: BC Managed Care – PPO | Admitting: Nurse Practitioner

## 2013-01-21 ENCOUNTER — Encounter: Payer: Self-pay | Admitting: Nurse Practitioner

## 2013-01-21 VITALS — BP 112/78 | HR 80 | Ht 67.0 in | Wt 100.4 lb

## 2013-01-21 DIAGNOSIS — N949 Unspecified condition associated with female genital organs and menstrual cycle: Secondary | ICD-10-CM

## 2013-01-21 DIAGNOSIS — R102 Pelvic and perineal pain: Secondary | ICD-10-CM

## 2013-01-21 DIAGNOSIS — R300 Dysuria: Secondary | ICD-10-CM

## 2013-01-21 LAB — POCT URINALYSIS DIPSTICK
Spec Grav, UA: 1.015
Urobilinogen, UA: 0.2

## 2013-01-22 LAB — GC/CHLAMYDIA PROBE AMP
CT Probe RNA: NEGATIVE
GC Probe RNA: NEGATIVE

## 2013-01-23 ENCOUNTER — Encounter: Payer: Self-pay | Admitting: Nurse Practitioner

## 2013-01-23 LAB — POCT WET PREP WITH KOH
Epithelial Wet Prep HPF POC: NORMAL
KOH Prep POC: NEGATIVE
RBC Wet Prep HPF POC: NEGATIVE
Yeast Wet Prep HPF POC: NEGATIVE
pH: 4.5

## 2013-01-23 LAB — POCT UA - MICROSCOPIC ONLY
Bacteria, U Microscopic: 0
WBC, Ur, HPF, POC: 0

## 2013-01-23 LAB — URINE CULTURE: Organism ID, Bacteria: NO GROWTH

## 2013-01-23 NOTE — Progress Notes (Signed)
Subjective:  Presents complaints of irritation and burning in the vaginal area for the past few days. No fever. Some left lower abdominal pain. Some constipation, no diarrhea. No dysuria. No urgency or frequency. 2 days ago had some slight discharge, describes it as "orange in color". Has had one encounter with a new partner, unprotected sex. Has had a hysterectomy. No nausea vomiting. Mild low back pain at times.  Objective:   BP 112/78  Pulse 80  Ht 5\' 7"  (1.702 m)  Wt 100 lb 6.4 oz (45.541 kg)  BMI 15.72 kg/m2 NAD. Alert, oriented. Lungs clear. No CVA area tenderness. Heart regular rate rhythm. Abdomen soft nondistended with mild tenderness in the left lower quadrant. No tenderness noted in the area of the left ovary. External GU mild irritation, no lesions noted. Vagina a small amount of white mucoid discharge. Vagina no irritation. Ovaries nontender, no masses. Again tenderness more towards the left lower quadrant near the sigmoid colon. Wet prep pH 4.5 with epithelial cells noted, otherwise normal. KOH test negative. Urine dipstick trace blood with moderate leukocytes.  Assessment:Pelvic pain - Plan: POCT urinalysis dipstick, GC/Chlamydia Probe Amp, Urine culture, POCT UA - Microscopic Only, POCT Wet Prep with KOH  Dysuria - Plan: Urine culture, POCT UA - Microscopic Only  Plan: OTC vaginal anti-yeast yeast cream as directed. Further followup based on test results, patient to call back sooner if symptoms worsen.

## 2013-02-20 ENCOUNTER — Other Ambulatory Visit: Payer: Self-pay | Admitting: *Deleted

## 2013-02-20 ENCOUNTER — Telehealth: Payer: Self-pay | Admitting: Family Medicine

## 2013-02-20 MED ORDER — FLUCONAZOLE 150 MG PO TABS: 150.0000 mg | ORAL_TABLET | Freq: Once | ORAL | Status: DC

## 2013-02-20 NOTE — Telephone Encounter (Signed)
Patient wants something called in for yeast infection to [facility].

## 2013-02-20 NOTE — Telephone Encounter (Signed)
Left message stating I put in the order for Diflucan 150 mg , 1 po times one, 1 refill, and to f/u if ongoing.

## 2013-02-20 NOTE — Telephone Encounter (Signed)
Diflucan 150 mg , 1 po times one, 1 refill , f/u if ongoing

## 2013-02-25 ENCOUNTER — Ambulatory Visit (HOSPITAL_COMMUNITY): Payer: Self-pay | Admitting: Psychology

## 2013-03-18 ENCOUNTER — Ambulatory Visit (INDEPENDENT_AMBULATORY_CARE_PROVIDER_SITE_OTHER): Payer: BC Managed Care – PPO | Admitting: Psychology

## 2013-03-18 DIAGNOSIS — F332 Major depressive disorder, recurrent severe without psychotic features: Secondary | ICD-10-CM

## 2013-03-20 ENCOUNTER — Ambulatory Visit: Payer: BC Managed Care – PPO | Admitting: Obstetrics and Gynecology

## 2013-03-21 ENCOUNTER — Encounter (HOSPITAL_COMMUNITY): Payer: Self-pay | Admitting: Psychiatry

## 2013-03-21 ENCOUNTER — Ambulatory Visit (INDEPENDENT_AMBULATORY_CARE_PROVIDER_SITE_OTHER): Payer: BC Managed Care – PPO | Admitting: Psychiatry

## 2013-03-21 VITALS — BP 108/72 | Ht 67.0 in | Wt 98.0 lb

## 2013-03-21 DIAGNOSIS — F329 Major depressive disorder, single episode, unspecified: Secondary | ICD-10-CM

## 2013-03-21 DIAGNOSIS — F332 Major depressive disorder, recurrent severe without psychotic features: Secondary | ICD-10-CM

## 2013-03-21 DIAGNOSIS — F411 Generalized anxiety disorder: Secondary | ICD-10-CM

## 2013-03-21 MED ORDER — MIRTAZAPINE 15 MG PO TABS: 15.0000 mg | ORAL_TABLET | Freq: Every day | ORAL | Status: DC
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
## 2013-03-22 ENCOUNTER — Telehealth (HOSPITAL_COMMUNITY): Payer: Self-pay | Admitting: Psychiatry

## 2013-03-22 NOTE — Telephone Encounter (Signed)
She meant mirtazapine and it is ok to take with other meds

## 2013-03-25 ENCOUNTER — Encounter: Payer: Self-pay | Admitting: Obstetrics and Gynecology

## 2013-03-25 ENCOUNTER — Ambulatory Visit (INDEPENDENT_AMBULATORY_CARE_PROVIDER_SITE_OTHER): Payer: BC Managed Care – PPO | Admitting: Obstetrics and Gynecology

## 2013-03-25 VITALS — BP 110/68 | Ht 67.0 in | Wt 97.8 lb

## 2013-03-25 DIAGNOSIS — N760 Acute vaginitis: Secondary | ICD-10-CM

## 2013-03-25 DIAGNOSIS — Z113 Encounter for screening for infections with a predominantly sexual mode of transmission: Secondary | ICD-10-CM

## 2013-03-25 DIAGNOSIS — A5901 Trichomonal vulvovaginitis: Secondary | ICD-10-CM

## 2013-03-25 DIAGNOSIS — B9689 Other specified bacterial agents as the cause of diseases classified elsewhere: Secondary | ICD-10-CM

## 2013-03-25 DIAGNOSIS — A499 Bacterial infection, unspecified: Secondary | ICD-10-CM

## 2013-03-25 LAB — POCT WET PREP WITH KOH

## 2013-03-25 MED ORDER — METRONIDAZOLE 500 MG PO TABS: 500.0000 mg | ORAL_TABLET | Freq: Two times a day (BID) | ORAL | Status: DC

## 2013-03-25 NOTE — Progress Notes (Signed)
Subjective:     Patient ID: [name], female   DOB: [birth year], 47 y.o.   MRN: [id number]  Vaginal Discharge The patient's primary symptoms include a vaginal discharge.   47 yr female s/p hyst with 2 mo hx of heavy dischg and malodor.   Review of Systems  Genitourinary: Positive for vaginal discharge.   seen at [facility] for same     Objective:   Physical Exam  Genitourinary: Vaginal discharge found.   koh + whiff test Wet prep + trich +clue cells    Assessment:     BV Trichomonas     Plan:     Rx metronidazole 500 bid x 7 d pt and partner

## 2013-03-25 NOTE — Patient Instructions (Signed)
Trichomoniasis Trichomoniasis is an infection, caused by the Trichomonas organism, that affects both women and men. In women, the outer female genitalia and the vagina are affected. In men, the penis is mainly affected, but the prostate and other reproductive organs can also be involved. Trichomoniasis is a sexually transmitted disease (STD) and is most often passed to another person through sexual contact. The majority of people who get trichomoniasis do so from a sexual encounter and are also at risk for other STDs. CAUSES   Sexual intercourse with an infected partner.  It can be present in swimming pools or hot tubs. SYMPTOMS   Abnormal gray-green frothy vaginal discharge in women.  Vaginal itching and irritation in women.  Itching and irritation of the area outside the vagina in women.  Penile discharge with or without pain in males.  Inflammation of the urethra (urethritis), causing painful urination.  Bleeding after sexual intercourse. RELATED COMPLICATIONS  Pelvic inflammatory disease.  Infection of the uterus (endometritis).  Infertility.  Tubal (ectopic) pregnancy.  It can be associated with other STDs, including gonorrhea and chlamydia, hepatitis B, and HIV. COMPLICATIONS DURING PREGNANCY  Early (premature) delivery.  Premature rupture of the membranes (PROM).  Low birth weight. DIAGNOSIS   Visualization of Trichomonas under the microscope from the vagina discharge.  Ph of the vagina greater than 4.5, tested with a test tape.  Trich Rapid Test.  Culture of the organism, but this is not usually needed.  It may be found on a Pap test.  Having a "strawberry cervix,"which means the cervix looks very red like a strawberry. TREATMENT   You may be given medication to fight the infection. Inform your caregiver if you could be or are pregnant. Some medications used to treat the infection should not be taken during pregnancy.  Over-the-counter medications or  creams to decrease itching or irritation may be recommended.  Your sexual partner will need to be treated if infected. HOME CARE INSTRUCTIONS   Take all medication prescribed by your caregiver.  Take over-the-counter medication for itching or irritation as directed by your caregiver.  Do not have sexual intercourse while you have the infection.  Do not douche or wear tampons.  Discuss your infection with your partner, as your partner may have acquired the infection from you. Or, your partner may have been the person who transmitted the infection to you.  Have your sex partner examined and treated if necessary.  Practice safe, informed, and protected sex.  See your caregiver for other STD testing. SEEK MEDICAL CARE IF:   You still have symptoms after you finish the medication.  You have an oral temperature above 102 F (38.9 C).  You develop belly (abdominal) pain.  You have pain when you urinate.  You have bleeding after sexual intercourse.  You develop a rash.  The medication makes you sick or makes you throw up (vomit). Document Released: 09/21/2000 Document Revised: 06/20/2011 Document Reviewed: 10/17/2008 [facility] Patient Information 2014 [place], [place].    Bacterial Vaginosis Bacterial vaginosis (BV) is a vaginal infection where the normal balance of bacteria in the vagina is disrupted. The normal balance is then replaced by an overgrowth of certain bacteria. There are several different kinds of bacteria that can cause BV. BV is the most common vaginal infection in women of childbearing age. CAUSES   The cause of BV is not fully understood. BV develops when there is an increase or imbalance of harmful bacteria.  Some activities or behaviors can upset the normal balance  of bacteria in the vagina and put women at increased risk including:  Having a new sex partner or multiple sex partners.  Douching.  Using an intrauterine device (IUD) for contraception.  It  is not clear what role sexual activity plays in the development of BV. However, women that have never had sexual intercourse are rarely infected with BV. Women do not get BV from toilet seats, bedding, swimming pools or from touching objects around them.  SYMPTOMS   Grey vaginal discharge.  A fish-like odor with discharge, especially after sexual intercourse.  Itching or burning of the vagina and vulva.  Burning or pain with urination.  Some women have no signs or symptoms at all. DIAGNOSIS  Your caregiver must examine the vagina for signs of BV. Your caregiver will perform lab tests and look at the sample of vaginal fluid through a microscope. They will look for bacteria and abnormal cells (clue cells), a pH test higher than 4.5, and a positive amine test all associated with BV.  RISKS AND COMPLICATIONS   Pelvic inflammatory disease (PID).  Infections following gynecology surgery.  Developing HIV.  Developing herpes virus. TREATMENT  Sometimes BV will clear up without treatment. However, all women with symptoms of BV should be treated to avoid complications, especially if gynecology surgery is planned. Female partners generally do not need to be treated. However, BV may spread between female sex partners so treatment is helpful in preventing a recurrence of BV.   BV may be treated with antibiotics. The antibiotics come in either pill or vaginal cream forms. Either can be used with nonpregnant or pregnant women, but the recommended dosages differ. These antibiotics are not harmful to the baby.  BV can recur after treatment. If this happens, a second round of antibiotics will often be prescribed.  Treatment is important for pregnant women. If not treated, BV can cause a premature delivery, especially for a pregnant woman who had a premature birth in the past. All pregnant women who have symptoms of BV should be checked and treated.  For chronic reoccurrence of BV, treatment with a type  of prescribed gel vaginally twice a week is helpful. HOME CARE INSTRUCTIONS   Finish all medication as directed by your caregiver.  Do not have sex until treatment is completed.  Tell your sexual partner that you have a vaginal infection. They should see their caregiver and be treated if they have problems, such as a mild rash or itching.  Practice safe sex. Use condoms. Only have 1 sex partner. PREVENTION  Basic prevention steps can help reduce the risk of upsetting the natural balance of bacteria in the vagina and developing BV:  Do not have sexual intercourse (be abstinent).  Do not douche.  Use all of the medicine prescribed for treatment of BV, even if the signs and symptoms go away.  Tell your sex partner if you have BV. That way, they can be treated, if needed, to prevent reoccurrence. SEEK MEDICAL CARE IF:   Your symptoms are not improving after 3 days of treatment.  You have increased discharge, pain, or fever. MAKE SURE YOU:   Understand these instructions.  Will watch your condition.  Will get help right away if you are not doing well or get worse. FOR MORE INFORMATION  Division of STD Prevention (DSTDP), Centers for Disease Control and Prevention: [employer] American Social Health Association (ASHA): www.ashastd.org  Document Released: 03/28/2005 Document Revised: 06/20/2011 Document Reviewed: 11/07/2012 [facility] Patient Information 2014 [place], [place].

## 2013-03-26 LAB — GC/CHLAMYDIA PROBE AMP
CT Probe RNA: NEGATIVE
GC Probe RNA: NEGATIVE

## 2013-04-18 ENCOUNTER — Ambulatory Visit (HOSPITAL_COMMUNITY): Payer: Self-pay | Admitting: Psychology

## 2013-04-19 ENCOUNTER — Encounter (HOSPITAL_COMMUNITY): Payer: Self-pay | Admitting: Psychology
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
## 2013-04-22 ENCOUNTER — Ambulatory Visit (HOSPITAL_COMMUNITY): Payer: Self-pay | Admitting: Psychiatry

## 2013-04-23 ENCOUNTER — Other Ambulatory Visit: Payer: Self-pay | Admitting: Family Medicine

## 2013-04-23 NOTE — Telephone Encounter (Signed)
Last office visit 01/21/13 (sick)

## 2013-04-23 NOTE — Telephone Encounter (Signed)
May have this refilled plus one additional refill. Will need an office visit in approximately 30-60 days for a followup regarding anxiety

## 2013-04-24 ENCOUNTER — Ambulatory Visit (INDEPENDENT_AMBULATORY_CARE_PROVIDER_SITE_OTHER): Payer: BC Managed Care – PPO | Admitting: Psychiatry

## 2013-04-24 ENCOUNTER — Encounter (HOSPITAL_COMMUNITY): Payer: Self-pay | Admitting: Psychiatry

## 2013-04-24 ENCOUNTER — Telehealth: Payer: Self-pay | Admitting: Family Medicine

## 2013-04-24 ENCOUNTER — Other Ambulatory Visit: Payer: BC Managed Care – PPO | Admitting: Obstetrics and Gynecology

## 2013-04-24 ENCOUNTER — Other Ambulatory Visit: Payer: Self-pay | Admitting: *Deleted

## 2013-04-24 VITALS — BP 110/78 | Ht 67.0 in | Wt 98.8 lb

## 2013-04-24 DIAGNOSIS — F411 Generalized anxiety disorder: Secondary | ICD-10-CM

## 2013-04-24 DIAGNOSIS — F329 Major depressive disorder, single episode, unspecified: Secondary | ICD-10-CM

## 2013-04-24 DIAGNOSIS — F332 Major depressive disorder, recurrent severe without psychotic features: Secondary | ICD-10-CM

## 2013-04-24 MED ORDER — QUETIAPINE FUMARATE 25 MG PO TABS: ORAL_TABLET | ORAL | Status: DC
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
## 2013-04-24 NOTE — Telephone Encounter (Signed)
Med faxed [facility]. Pt notified.

## 2013-04-24 NOTE — Telephone Encounter (Signed)
Patient needs [facility] sent to [facility]. Pharmacy says they have not received it.

## 2013-04-25 ENCOUNTER — Other Ambulatory Visit: Payer: Self-pay | Admitting: Obstetrics and Gynecology

## 2013-04-25 NOTE — Telephone Encounter (Signed)
retreatment for Trich and BV called in

## 2013-05-03 ENCOUNTER — Ambulatory Visit (HOSPITAL_COMMUNITY): Payer: Self-pay | Admitting: Psychology

## 2013-05-08 ENCOUNTER — Other Ambulatory Visit: Payer: BC Managed Care – PPO | Admitting: Obstetrics and Gynecology

## 2013-05-20 ENCOUNTER — Ambulatory Visit (HOSPITAL_COMMUNITY): Payer: Self-pay | Admitting: Psychiatry

## 2013-06-03 ENCOUNTER — Encounter: Payer: Self-pay | Admitting: Family Medicine

## 2013-06-03 ENCOUNTER — Ambulatory Visit (INDEPENDENT_AMBULATORY_CARE_PROVIDER_SITE_OTHER): Payer: BC Managed Care – PPO | Admitting: Family Medicine

## 2013-06-03 VITALS — BP 100/78 | Ht 67.0 in | Wt 102.6 lb

## 2013-06-03 DIAGNOSIS — R634 Abnormal weight loss: Secondary | ICD-10-CM

## 2013-06-03 DIAGNOSIS — R32 Unspecified urinary incontinence: Secondary | ICD-10-CM

## 2013-06-03 LAB — CBC WITH DIFFERENTIAL/PLATELET
BASOS ABS: 0 10*3/uL (ref 0.0–0.1)
Basophils Relative: 0 % (ref 0–1)
Eosinophils Absolute: 0.1 10*3/uL (ref 0.0–0.7)
Eosinophils Relative: 2 % (ref 0–5)
HEMATOCRIT: 38 % (ref 36.0–46.0)
Hemoglobin: 13 g/dL (ref 12.0–15.0)
LYMPHS PCT: 29 % (ref 12–46)
Lymphs Abs: 2.1 10*3/uL (ref 0.7–4.0)
MCH: 33.4 pg (ref 26.0–34.0)
MCHC: 34.2 g/dL (ref 30.0–36.0)
MCV: 97.7 fL (ref 78.0–100.0)
MONO ABS: 1 10*3/uL (ref 0.1–1.0)
Monocytes Relative: 13 % — ABNORMAL HIGH (ref 3–12)
NEUTROS ABS: 4.1 10*3/uL (ref 1.7–7.7)
NEUTROS PCT: 56 % (ref 43–77)
Platelets: 216 10*3/uL (ref 150–400)
RBC: 3.89 MIL/uL (ref 3.87–5.11)
RDW: 12.7 % (ref 11.5–15.5)
WBC: 7.4 10*3/uL (ref 4.0–10.5)

## 2013-06-03 LAB — BASIC METABOLIC PANEL
BUN: 14 mg/dL (ref 6–23)
CALCIUM: 9.4 mg/dL (ref 8.4–10.5)
CO2: 30 meq/L (ref 19–32)
Chloride: 100 mEq/L (ref 96–112)
Creat: 0.76 mg/dL (ref 0.50–1.10)
GLUCOSE: 94 mg/dL (ref 70–99)
Potassium: 4.1 mEq/L (ref 3.5–5.3)
SODIUM: 134 meq/L — AB (ref 135–145)

## 2013-06-03 LAB — HEPATIC FUNCTION PANEL
ALT: 15 U/L (ref 0–35)
AST: 17 U/L (ref 0–37)
Albumin: 4.6 g/dL (ref 3.5–5.2)
Alkaline Phosphatase: 36 U/L — ABNORMAL LOW (ref 39–117)
BILIRUBIN DIRECT: 0.1 mg/dL (ref 0.0–0.3)
BILIRUBIN INDIRECT: 0.2 mg/dL (ref 0.2–1.2)
BILIRUBIN TOTAL: 0.3 mg/dL (ref 0.2–1.2)
Total Protein: 6.6 g/dL (ref 6.0–8.3)

## 2013-06-03 LAB — T4, FREE: Free T4: 0.88 ng/dL (ref 0.80–1.80)

## 2013-06-03 LAB — TSH: TSH: 1.341 u[IU]/mL (ref 0.350–4.500)

## 2013-06-03 NOTE — Patient Instructions (Signed)
Food diary- turn into us

## 2013-06-03 NOTE — Progress Notes (Addendum)
Subjective:    Patient ID: [name], female    DOB: [birth year], 48 y.o.   MRN: [id number]  HPI  Patient arrives to discuss problems with continued weight loss. Patient relates that she's been losing weight when I look back over the chart her weight fluctuates between 99 and 103. She states that our scales are wrong. She states that her weight at home is 99. I tried to tell her that our scales are calibrated and that the important part is we monitor the weight on the same scales to see if there is a trend. I also told her that going back all the way to in 1999 her weight has fluctuated anywhere but to wean 100 pounds in the 108 pounds but the vast majority at times it was between 99 and 103. She does state that she has family members that are slender. She does not feel her weight issue has a genetic issue. She feels that there's something wrong.  Patient also states that she was here in October of this year seen by her nurse practitioner diagnosed at that time is having a normal exam she stated 2 months later she was having vaginal discharge soft gynecologist and they diagnosed her with trichomoniasis. She feels that the diagnosis was missed in October and caused her to have this longer. She was treated successfully with medicine and suffers no ongoing consequences. Overview of the neck in October since that urinalysis was negative urine testing for GC and Chlamydia was negative. I sympathize with her point of view but also told her that if in the future if she has a problem that doesn't get better she ought to consider following up here or seen the specialist sooner.  Incontinence- stool noted with wiping, used witch hazel she states that she will have involuntary small amounts of incontinence multiple times a day she denies being constipated denies diarrhea denies hematochezia she denies abdominal pain  Dizzy spells in Sept but these have resolved  Seeing psy/psychiatry regarding her anxiety  issues. They have tried several different medicines in hopes that it would help her anxiety and stimulate her appetite but she states they haven't worked she hopes to go back there soon as her finances improve  Hot flashes past 6 months she states she will followup with gynecology regarding these  Skin cancer concerns-she has 2 spots on her left arm and a spot on her back that concerned her. She states none of vomiting itching or bleeding or draining. She states there is one spot on her back the dermatologist told her it could be removed she deferred on at that time   Review of Systems  Constitutional: Negative for activity change, appetite change and fatigue.  HENT: Negative for congestion, ear discharge and rhinorrhea.   Eyes: Negative for discharge.  Respiratory: Negative for cough, chest tightness and wheezing.   Cardiovascular: Negative for chest pain.  Gastrointestinal: Negative for vomiting and abdominal pain.  Genitourinary: Negative for frequency and difficulty urinating.  Musculoskeletal: Negative for neck pain.  Allergic/Immunologic: Negative for environmental allergies and food allergies.  Neurological: Negative for weakness and headaches.  Psychiatric/Behavioral: Negative for behavioral problems and agitation.       Objective:   Physical Exam  Constitutional: She is oriented to person, place, and time. She appears well-developed and well-nourished.  HENT:  Head: Normocephalic.  Eyes: Pupils are equal, round, and reactive to light.  Neck: No thyromegaly present.  Cardiovascular: Normal rate, regular rhythm, normal heart sounds  and intact distal pulses.   No murmur heard. Pulmonary/Chest: Effort normal and breath sounds normal. No respiratory distress. She has no wheezes.  Abdominal: Soft. Bowel sounds are normal. She exhibits no distension and no mass. There is no tenderness.  Musculoskeletal: Normal range of motion. She exhibits no edema and no tenderness.    Lymphadenopathy:    She has no cervical adenopathy.  Neurological: She is alert and oriented to person, place, and time. She exhibits normal muscle tone.  Skin: Skin is warm and dry.  Psychiatric: She has a normal mood and affect. Her behavior is normal.   On visual exam she has 2 normal moles on the left forearm she has a darkened mole on her upper back I told her we could set her up with dermatology to have this removed she does not want to have it removed in defers on the referral. I told her that this area could turn into melanoma which is a bad knees diagnosis that could kill her she stated she did not want to see dermatologist at this time         Assessment & Plan:  Weight issue-I. encourage her to eat richer foods higher calories she is to bring a food diary for us to look over we will be doing some lab testing to rule out any other metabolic problems. But I feel that this patient is genetically thin and this contributes to her weight being as it  incontinence-involuntary referral to GI is recommended hopefully they can help get things under better control. They may end up having her do a colonoscopy since she is so close to being 50 No need for other intervention currently  Skin problem - pt defers dermatology at this time, she was warned that these moles could turn into skin cancer  25-30 minutes spent with patient

## 2013-06-04 ENCOUNTER — Encounter: Payer: Self-pay | Admitting: Family Medicine

## 2013-06-04 LAB — TISSUE TRANSGLUTAMINASE, IGA: Tissue Transglutaminase Ab, IgA: 3.4 U/mL (ref <20)

## 2013-06-05 ENCOUNTER — Encounter: Payer: Self-pay | Admitting: Gastroenterology

## 2013-06-07 ENCOUNTER — Other Ambulatory Visit: Payer: Self-pay | Admitting: Nurse Practitioner

## 2013-06-26 ENCOUNTER — Encounter: Payer: Self-pay | Admitting: Gastroenterology

## 2013-06-26 ENCOUNTER — Ambulatory Visit (INDEPENDENT_AMBULATORY_CARE_PROVIDER_SITE_OTHER): Payer: BC Managed Care – PPO | Admitting: Gastroenterology

## 2013-06-26 ENCOUNTER — Encounter (HOSPITAL_COMMUNITY): Payer: Self-pay | Admitting: Pharmacy Technician

## 2013-06-26 VITALS — BP 100/66 | HR 53 | Temp 97.2°F | Wt 104.4 lb

## 2013-06-26 DIAGNOSIS — R159 Full incontinence of feces: Secondary | ICD-10-CM | POA: Insufficient documentation

## 2013-06-26 NOTE — Progress Notes (Signed)
cc'd to pcp 

## 2013-06-26 NOTE — Patient Instructions (Signed)
Start taking supplemental fiber daily such as metamucil or benefiber.   Avoid straining.   We have scheduled you for a colonoscopy with Dr. Darrick PennaFields in the near future!

## 2013-06-26 NOTE — Progress Notes (Signed)
Referring Provider: Babs Sciara, MD Primary Care Physician:  Lilyan Punt, MD Primary Gastroenterologist:  Dr. Darrick Penna   Chief Complaint  Patient presents with  . Encopresis    HPI:   [name] presents today at the request of Dr. Gerda Diss secondary to intermittent fecal seepage. Been stressed lately, not eating right. Doesn't feel like food is digesting all the way from looking at her stool. Sees undigested food in stool. Notes intermittent fecal seepage. Will have a BM then a few hours later notes burning/itching, then notes seepage. Denies significant constipation, diarrhea. Symptoms 6-8 months worsening and has noted over the past year. Has noted paper hematochezia with wiping after fecal seepage. Sometimes lower abdomen sharp pain that lasts 15 seconds but very rare. No associated symptoms. States since eating better has had less leakage. No N/V. No prior colonoscopy. No FH of colon cancer.   Past Medical History  Diagnosis Date  . Depression   . Anxiety   . COPD (chronic obstructive pulmonary disease)   . 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 Tubal ligation    . Partial hysterectomy      Current Outpatient Prescriptions  Medication Sig Dispense Refill  . albuterol (PROVENTIL HFA;VENTOLIN HFA) 108 (90 BASE) MCG/ACT inhaler Inhale 2 puffs into the lungs every 4 (four) hours as needed for wheezing.      Marland Kitchen ALPRAZolam (XANAX) 0.5 MG tablet TAKE (1) TABLET BY MOUTH THREE TIMES DAILY AS NEEDED FOR ANXIETY. MONTHLY REFILL.  75 tablet  1  . DULoxetine (CYMBALTA) 60 MG capsule TAKE 1 CAPSULE BY MOUTH ONCE DAILY  30 capsule  0  . tiotropium (SPIRIVA) 18 MCG inhalation capsule Place 1 capsule (18 mcg total) into inhaler and inhale daily.  30 capsule  12   No current facility-administered medications for this visit.    Allergies as of 06/26/2013 - Review Complete 06/26/2013  Allergen Reaction Noted  . Ampicillin   01/21/2011  . Effexor [venlafaxine hcl]  07/31/2012  . Penicillins  07/31/2012  . Neomycin Rash 06/26/2012    Family History  Problem Relation Age of Onset  . Hypertension Sister   . Depression Sister   . Drug abuse Sister   . Hypertension Mother   . Osteoporosis Mother   . Diabetes Mother   . Alcohol abuse Mother   . Depression Mother   . Heart disease Father     MI  . Depression Father   . Depression Sister   . Colon cancer Neg Hx     History   Social History  . Marital Status: Married    Spouse Name: N/A    Number of Children: N/A  . Years of Education: N/A   Occupational History  . Not on file.   Social History Main Topics  . Smoking status: Current Every Day Smoker -- 1.00 packs/day for 30 years    Types: Cigarettes  . Smokeless tobacco: Never Used  . Alcohol Use: Yes     Comment: occasionally  . Drug Use: No  . Sexual Activity: Not Currently    Birth Control/ Protection: Surgical   Other Topics Concern  . Not on file   Social History Narrative  . No narrative on file    Review of Systems: Gen: see HPI CV: Dizzy spells Resp: occasional SOB, DOE GI: see HPI GU : Denies urinary burning, urinary frequency, urinary incontinence.  MS: Denies joint pain, muscle  weakness, cramps, limited movement Derm: Denies rash, itching, dry skin Psych: baseline for anxiety/depression Heme: Denies bruising, bleeding, and enlarged lymph nodes.  Physical Exam: BP 100/66  Pulse 53  Temp(Src) 97.2 F (36.2 C) (Oral)  Wt 104 lb 6.4 oz (47.356 kg) General:   Alert and oriented. Well-developed, well-nourished, pleasant and cooperative. Head:  Normocephalic and atraumatic. Eyes:  Conjunctiva pink, sclera clear, no icterus.   Conjunctiva pink. Ears:  Normal auditory acuity. Nose:  No deformity, discharge,  or lesions. Mouth:  No deformity or lesions, mucosa pink and moist.  Lungs:  Clear to auscultation bilaterally, without wheezing, rales, or rhonchi.  Heart:  S1,  S2 present without murmurs noted.  Abdomen:  +BS, soft, non-tender and non-distended. Without mass or HSM. No rebound or guarding. No hernias noted. Rectal:  Deferred until colonoscopy Msk:  Symmetrical without gross deformities. Normal posture. Pulses:  Normal pulses noted. Extremities:  Without clubbing or edema. Neurologic:  Alert and  oriented x4;  grossly normal neurologically. Skin:  Intact, warm and dry without significant lesions or rashes Psych:  Alert and cooperative. Normal mood and affect.

## 2013-06-26 NOTE — Assessment & Plan Note (Signed)
48 year old female with intermittent fecal seepage, occasional rectal discomfort, low-volume hematochezia noted over past year but more noticeable past 6-8 months; with better dietary habits, she has noted less fecal seepage. No FH of colon cancer. I have asked her to add supplemental fiber daily, and we will proceed with a colonoscopy in near future due to change in bowel habits. Query internal hemorrhoids, lax sphincter tone as possible culprit.   Proceed with colonoscopy with Dr. Darrick PennaFields in the near future. The risks, benefits, and alternatives have been discussed in detail with the patient. They state understanding and desire to proceed.

## 2013-06-27 ENCOUNTER — Telehealth: Payer: Self-pay | Admitting: Gastroenterology

## 2013-06-27 NOTE — Telephone Encounter (Signed)
Patient called this morning and has cancelled her TCS and wants to postpone it for about a month

## 2013-06-27 NOTE — Telephone Encounter (Signed)
Noted  

## 2013-06-28 ENCOUNTER — Ambulatory Visit (HOSPITAL_COMMUNITY)
Admission: RE | Admit: 2013-06-28 | Payer: BC Managed Care – PPO | Source: Ambulatory Visit | Admitting: Gastroenterology

## 2013-06-28 ENCOUNTER — Encounter (HOSPITAL_COMMUNITY): Admission: RE | Payer: Self-pay | Source: Ambulatory Visit

## 2013-06-28 SURGERY — COLONOSCOPY
Anesthesia: Moderate Sedation

## 2013-07-01 ENCOUNTER — Other Ambulatory Visit: Payer: Self-pay | Admitting: Family Medicine

## 2013-07-02 NOTE — Telephone Encounter (Signed)
May refill x4 

## 2013-07-07 ENCOUNTER — Other Ambulatory Visit: Payer: Self-pay | Admitting: Family Medicine

## 2013-07-08 ENCOUNTER — Other Ambulatory Visit: Payer: Self-pay | Admitting: Family Medicine

## 2013-08-08 ENCOUNTER — Other Ambulatory Visit: Payer: Self-pay | Admitting: Nurse Practitioner

## 2013-12-30 ENCOUNTER — Other Ambulatory Visit: Payer: Self-pay | Admitting: Family Medicine

## 2013-12-30 NOTE — Telephone Encounter (Signed)
May have a prescription for 25 tablets. Needs office visit before further medication

## 2014-01-08 ENCOUNTER — Ambulatory Visit (INDEPENDENT_AMBULATORY_CARE_PROVIDER_SITE_OTHER): Payer: BC Managed Care – PPO | Admitting: Nurse Practitioner

## 2014-01-08 ENCOUNTER — Encounter: Payer: Self-pay | Admitting: Nurse Practitioner

## 2014-01-08 VITALS — BP 108/78 | Ht 67.0 in | Wt 99.0 lb

## 2014-01-08 DIAGNOSIS — F338 Other recurrent depressive disorders: Secondary | ICD-10-CM

## 2014-01-08 DIAGNOSIS — B3731 Acute candidiasis of vulva and vagina: Secondary | ICD-10-CM

## 2014-01-08 DIAGNOSIS — F39 Unspecified mood [affective] disorder: Secondary | ICD-10-CM

## 2014-01-08 DIAGNOSIS — N949 Unspecified condition associated with female genital organs and menstrual cycle: Secondary | ICD-10-CM

## 2014-01-08 DIAGNOSIS — B373 Candidiasis of vulva and vagina: Secondary | ICD-10-CM

## 2014-01-08 DIAGNOSIS — R102 Pelvic and perineal pain: Secondary | ICD-10-CM

## 2014-01-08 MED ORDER — ALPRAZOLAM 0.5 MG PO TABS: ORAL_TABLET | ORAL | Status: DC

## 2014-01-08 MED ORDER — BUPROPION HCL ER (XL) 150 MG PO TB24: 150.0000 mg | ORAL_TABLET | Freq: Every day | ORAL | Status: DC

## 2014-01-08 MED ORDER — FLUCONAZOLE 150 MG PO TABS: ORAL_TABLET | ORAL | Status: DC

## 2014-01-08 MED ORDER — DULOXETINE HCL 60 MG PO CPEP: ORAL_CAPSULE | ORAL | Status: DC

## 2014-01-08 NOTE — Patient Instructions (Signed)
B vitamin complex with selenium Exposure to sunlight or bright lights

## 2014-01-09 LAB — GC/CHLAMYDIA PROBE AMP
CT Probe RNA: NEGATIVE
GC Probe RNA: NEGATIVE

## 2014-01-10 ENCOUNTER — Encounter: Payer: Self-pay | Admitting: Nurse Practitioner

## 2014-01-10 NOTE — Progress Notes (Signed)
Subjective:  Presents complaints of vaginal odor that began last week. Minimal vaginal discharge, no color. Is separated from her husband for a while, she has not had any new partners unsure about her husband. No fever. Some mild left pelvic pain. Has had a hysterectomy but still has her ovaries. Has noticed this discomfort will come about once a month for few days. Resolves on its own. No nausea vomiting. No urinary symptoms. Also has increase in her depression symptoms lately, has noticed an exacerbation particularly during the winter months.   Objective:   BP 108/78  Ht 5\' 7"  (1.702 m)  Wt 99 lb (44.906 kg)  BMI 15.50 kg/m2 NAD. Alert, oriented. Dressed appropriate. Thoughts logical coherent and relevant. Lungs clear. Heart regular rhythm. Abdomen soft nondistended with minimal left pelvic area tenderness. Vagina a small amount of clumpy white discharge noted, no odor. No irritation. Bimanual exam mild tenderness on the left side, no obvious masses. No rebound or guarding. Wet prep pH 4.5 with some yeast noted.  Assessment:  Problem List Items Addressed This Visit     Other   Seasonal affective disorder   Relevant Medications      buPROPion (WELLBUTRIN XL) 24 hr tablet      DULoxetine (CYMBALTA) DR capsule      ALPRAZolam Prudy Feeler(XANAX) tablet    Other Visit Diagnoses   Pelvic pain in female    -  Primary    Relevant Orders       GC/Chlamydia Probe Amp (Completed)    Vaginal candidiasis        Relevant Medications       fluconazole (DIFLUCAN) tablet 150 mg        Plan:  Meds ordered this encounter  Medications  . buPROPion (WELLBUTRIN XL) 150 MG 24 hr tablet    Sig: Take 1 tablet (150 mg total) by mouth daily.    Dispense:  30 tablet    Refill:  5    Order Specific Question:  Supervising Provider    Answer:  Merlyn AlbertLUKING, WILLIAM S [[id number]]  . DULoxetine (CYMBALTA) 60 MG capsule    Sig: TAKE 1 CAPSULE BY MOUTH EVERY DAY    Dispense:  90 capsule    Refill:  1    Order Specific Question:   Supervising Provider    Answer:  Merlyn AlbertLUKING, WILLIAM S [[id number]]  . ALPRAZolam (XANAX) 0.5 MG tablet    Sig: TAKE (1) TABLET BY MOUTH THREE TIMES DAILY AS NEEDED FOR ANXIETY. MONTHLY REFILL.    Dispense:  75 tablet    Refill:  5    Order Specific Question:  Supervising Provider    Answer:  Merlyn AlbertLUKING, WILLIAM S [[id number]]  . fluconazole (DIFLUCAN) 150 MG tablet    Sig: One po qd prn yeast infection; may repeat in 3-4 days if needed    Dispense:  2 tablet    Refill:  0    Order Specific Question:  Supervising Provider    Answer:  Merlyn AlbertLUKING, WILLIAM S [[id number]]   Trial of Wellbutrin added to regimen to help with energy and depression. DC med and call if any adverse effects. Patient states she's taken this in the past without difficulty. Discussed safe sex issues. Reminded patient about yearly preventive health physical. Call back if any further problems. Return in about 6 months (around 07/09/2014).

## 2014-07-17 ENCOUNTER — Emergency Department (HOSPITAL_COMMUNITY): Payer: BLUE CROSS/BLUE SHIELD | Admitting: Anesthesiology

## 2014-07-17 ENCOUNTER — Ambulatory Visit (HOSPITAL_COMMUNITY)
Admission: EM | Admit: 2014-07-17 | Discharge: 2014-07-18 | Disposition: A | Discharge status: Home / Self Care | Payer: BLUE CROSS/BLUE SHIELD | Attending: Emergency Medicine | Admitting: Emergency Medicine

## 2014-07-17 ENCOUNTER — Encounter (HOSPITAL_COMMUNITY)
Admission: EM | Disposition: A | Discharge status: Home / Self Care | Payer: Self-pay | Source: Home / Self Care | Attending: Emergency Medicine

## 2014-07-17 ENCOUNTER — Emergency Department (HOSPITAL_COMMUNITY): Payer: BLUE CROSS/BLUE SHIELD

## 2014-07-17 ENCOUNTER — Encounter (HOSPITAL_COMMUNITY): Payer: Self-pay

## 2014-07-17 DIAGNOSIS — W458XXA Other foreign body or object entering through skin, initial encounter: Secondary | ICD-10-CM | POA: Insufficient documentation

## 2014-07-17 DIAGNOSIS — W278XXA Contact with other nonpowered hand tool, initial encounter: Secondary | ICD-10-CM | POA: Diagnosis not present

## 2014-07-17 DIAGNOSIS — J449 Chronic obstructive pulmonary disease, unspecified: Secondary | ICD-10-CM | POA: Diagnosis not present

## 2014-07-17 DIAGNOSIS — S61219A Laceration without foreign body of unspecified finger without damage to nail, initial encounter: Secondary | ICD-10-CM | POA: Diagnosis present

## 2014-07-17 DIAGNOSIS — S66121A Laceration of flexor muscle, fascia and tendon of left index finger at wrist and hand level, initial encounter: Secondary | ICD-10-CM | POA: Diagnosis not present

## 2014-07-17 DIAGNOSIS — F329 Major depressive disorder, single episode, unspecified: Secondary | ICD-10-CM | POA: Insufficient documentation

## 2014-07-17 DIAGNOSIS — F17210 Nicotine dependence, cigarettes, uncomplicated: Secondary | ICD-10-CM | POA: Diagnosis not present

## 2014-07-17 DIAGNOSIS — S65511A Laceration of blood vessel of left index finger, initial encounter: Secondary | ICD-10-CM | POA: Insufficient documentation

## 2014-07-17 DIAGNOSIS — S62621B Displaced fracture of medial phalanx of left index finger, initial encounter for open fracture: Secondary | ICD-10-CM | POA: Diagnosis not present

## 2014-07-17 DIAGNOSIS — S64491A Injury of digital nerve of left index finger, initial encounter: Secondary | ICD-10-CM | POA: Diagnosis not present

## 2014-07-17 DIAGNOSIS — F419 Anxiety disorder, unspecified: Secondary | ICD-10-CM | POA: Diagnosis not present

## 2014-07-17 HISTORY — PX: NERVE, TENDON AND ARTERY REPAIR: SHX5695

## 2014-07-17 HISTORY — PX: I & D EXTREMITY: SHX5045

## 2014-07-17 HISTORY — PX: I&D EXTREMITY: SHX5045

## 2014-07-17 LAB — BASIC METABOLIC PANEL
ANION GAP: 11 (ref 5–15)
BUN: 9 mg/dL (ref 6–23)
CO2: 22 mmol/L (ref 19–32)
Calcium: 9.3 mg/dL (ref 8.4–10.5)
Chloride: 106 mmol/L (ref 96–112)
Creatinine, Ser: 0.75 mg/dL (ref 0.50–1.10)
Glucose, Bld: 96 mg/dL (ref 70–99)
POTASSIUM: 4.1 mmol/L (ref 3.5–5.1)
Sodium: 139 mmol/L (ref 135–145)

## 2014-07-17 LAB — CBC
HEMATOCRIT: 35.7 % — AB (ref 36.0–46.0)
Hemoglobin: 11.8 g/dL — ABNORMAL LOW (ref 12.0–15.0)
MCH: 32.6 pg (ref 26.0–34.0)
MCHC: 33.1 g/dL (ref 30.0–36.0)
MCV: 98.6 fL (ref 78.0–100.0)
PLATELETS: 211 10*3/uL (ref 150–400)
RBC: 3.62 MIL/uL — AB (ref 3.87–5.11)
RDW: 12.4 % (ref 11.5–15.5)
WBC: 11.2 10*3/uL — ABNORMAL HIGH (ref 4.0–10.5)

## 2014-07-17 SURGERY — IRRIGATION AND DEBRIDEMENT EXTREMITY
Anesthesia: General | Site: Finger | Laterality: Left

## 2014-07-17 MED ORDER — LIDOCAINE HCL (CARDIAC) 20 MG/ML IV SOLN
INTRAVENOUS | Status: AC
  Filled 2014-07-17: qty 5

## 2014-07-17 MED ORDER — LIDOCAINE HCL (PF) 1 % IJ SOLN
INTRAMUSCULAR | Status: AC
  Filled 2014-07-17: qty 30

## 2014-07-17 MED ORDER — FENTANYL CITRATE 0.05 MG/ML IJ SOLN
INTRAMUSCULAR | Status: DC | PRN
  Administered 2014-07-17: 75 ug via INTRAVENOUS
  Administered 2014-07-17 – 2014-07-18 (×3): 25 ug via INTRAVENOUS

## 2014-07-17 MED ORDER — TETANUS-DIPHTH-ACELL PERTUSSIS 5-2.5-18.5 LF-MCG/0.5 IM SUSP
0.5000 mL | Freq: Once | INTRAMUSCULAR | Status: AC
  Administered 2014-07-17: 0.5 mL via INTRAMUSCULAR
  Filled 2014-07-17: qty 0.5

## 2014-07-17 MED ORDER — BUPIVACAINE HCL (PF) 0.5 % IJ SOLN
10.0000 mL | Freq: Once | INTRAMUSCULAR | Status: AC
  Administered 2014-07-17: 10 mL
  Filled 2014-07-17: qty 30

## 2014-07-17 MED ORDER — LACTATED RINGERS IV SOLN
INTRAVENOUS | Status: DC | PRN
  Administered 2014-07-17: 23:00:00 via INTRAVENOUS

## 2014-07-17 MED ORDER — PROPOFOL 10 MG/ML IV BOLUS
INTRAVENOUS | Status: AC
  Filled 2014-07-17: qty 20

## 2014-07-17 MED ORDER — PROPOFOL 10 MG/ML IV BOLUS
INTRAVENOUS | Status: DC | PRN
  Administered 2014-07-17: 170 mg via INTRAVENOUS

## 2014-07-17 MED ORDER — CEFAZOLIN SODIUM 1-5 GM-% IV SOLN
1.0000 g | Freq: Once | INTRAVENOUS | Status: AC
  Administered 2014-07-17: 1 g via INTRAVENOUS
  Filled 2014-07-17: qty 50

## 2014-07-17 MED ORDER — SODIUM CHLORIDE 0.9 % IJ SOLN
INTRAMUSCULAR | Status: AC
  Filled 2014-07-17: qty 10

## 2014-07-17 MED ORDER — EPHEDRINE SULFATE 50 MG/ML IJ SOLN
INTRAMUSCULAR | Status: AC
  Filled 2014-07-17: qty 1

## 2014-07-17 MED ORDER — LIDOCAINE HCL (CARDIAC) 20 MG/ML IV SOLN
INTRAVENOUS | Status: DC | PRN
  Administered 2014-07-17: 80 mg via INTRAVENOUS

## 2014-07-17 MED ORDER — SODIUM CHLORIDE 0.9 % IV SOLN
INTRAVENOUS | Status: DC
  Administered 2014-07-17: 19:00:00 via INTRAVENOUS

## 2014-07-17 MED ORDER — EPHEDRINE SULFATE 50 MG/ML IJ SOLN
INTRAMUSCULAR | Status: DC | PRN
  Administered 2014-07-17 (×3): 10 mg via INTRAVENOUS

## 2014-07-17 MED ORDER — SODIUM CHLORIDE 0.9 % IR SOLN
Status: DC | PRN
  Administered 2014-07-17: 1000 mL

## 2014-07-17 MED ORDER — CEFAZOLIN SODIUM-DEXTROSE 2-3 GM-% IV SOLR
INTRAVENOUS | Status: DC | PRN
  Administered 2014-07-17: 2 g via INTRAVENOUS

## 2014-07-17 MED ORDER — FENTANYL CITRATE 0.05 MG/ML IJ SOLN
100.0000 ug | Freq: Once | INTRAMUSCULAR | Status: AC
  Administered 2014-07-17: 100 ug via INTRAVENOUS
  Filled 2014-07-17: qty 2

## 2014-07-17 MED ORDER — MIDAZOLAM HCL 2 MG/2ML IJ SOLN
INTRAMUSCULAR | Status: AC
  Filled 2014-07-17: qty 2

## 2014-07-17 MED ORDER — BUPIVACAINE HCL (PF) 0.25 % IJ SOLN
INTRAMUSCULAR | Status: AC
  Filled 2014-07-17: qty 30

## 2014-07-17 MED ORDER — MIDAZOLAM HCL 2 MG/2ML IJ SOLN
INTRAMUSCULAR | Status: DC | PRN
  Administered 2014-07-17: 2 mg via INTRAVENOUS

## 2014-07-17 MED ORDER — ONDANSETRON HCL 4 MG/2ML IJ SOLN
4.0000 mg | Freq: Once | INTRAMUSCULAR | Status: AC
  Administered 2014-07-17: 4 mg via INTRAVENOUS
  Filled 2014-07-17: qty 2

## 2014-07-17 MED ORDER — FENTANYL CITRATE 0.05 MG/ML IJ SOLN
INTRAMUSCULAR | Status: AC
  Filled 2014-07-17: qty 5

## 2014-07-17 SURGICAL SUPPLY — 87 items
BAG DECANTER FOR FLEXI CONT (MISCELLANEOUS) IMPLANT
BANDAGE COBAN STERILE 2 (GAUZE/BANDAGES/DRESSINGS) IMPLANT
BANDAGE ELASTIC 3 VELCRO ST LF (GAUZE/BANDAGES/DRESSINGS) ×2 IMPLANT
BANDAGE ELASTIC 4 VELCRO ST LF (GAUZE/BANDAGES/DRESSINGS) ×2 IMPLANT
BLADE MINI RND TIP GREEN BEAV (BLADE) IMPLANT
BLADE SURG 15 STRL LF DISP TIS (BLADE) ×2 IMPLANT
BLADE SURG 15 STRL SS (BLADE) ×4
BNDG CMPR 9X4 STRL LF SNTH (GAUZE/BANDAGES/DRESSINGS) ×1
BNDG COHESIVE 1X5 TAN STRL LF (GAUZE/BANDAGES/DRESSINGS) IMPLANT
BNDG CONFORM 2 STRL LF (GAUZE/BANDAGES/DRESSINGS) IMPLANT
BNDG ESMARK 4X9 LF (GAUZE/BANDAGES/DRESSINGS) ×1 IMPLANT
BNDG GAUZE ELAST 4 BULKY (GAUZE/BANDAGES/DRESSINGS) ×2 IMPLANT
CHLORAPREP W/TINT 26ML (MISCELLANEOUS) ×2 IMPLANT
CORDS BIPOLAR (ELECTRODE) ×2 IMPLANT
COVER MAYO STAND STRL (DRAPES) ×2 IMPLANT
COVER SURGICAL LIGHT HANDLE (MISCELLANEOUS) ×2 IMPLANT
COVER TABLE BACK 60X90 (DRAPES) ×2 IMPLANT
CUFF TOURNIQUET SINGLE 18IN (TOURNIQUET CUFF) IMPLANT
DECANTER SPIKE VIAL GLASS SM (MISCELLANEOUS) ×2 IMPLANT
DRAIN PENROSE 1/4X12 LTX STRL (WOUND CARE) IMPLANT
DRAPE EXTREMITY T 121X128X90 (DRAPE) ×2 IMPLANT
DRAPE MICROSCOPE LEICA (MISCELLANEOUS) ×1 IMPLANT
DRAPE SURG 17X23 STRL (DRAPES) ×2 IMPLANT
DRSG ADAPTIC 3X8 NADH LF (GAUZE/BANDAGES/DRESSINGS) IMPLANT
DRSG EMULSION OIL 3X3 NADH (GAUZE/BANDAGES/DRESSINGS) ×1 IMPLANT
DRSG PAD ABDOMINAL 8X10 ST (GAUZE/BANDAGES/DRESSINGS) ×4 IMPLANT
GAUZE SPONGE 4X4 12PLY STRL (GAUZE/BANDAGES/DRESSINGS) ×2 IMPLANT
GAUZE XEROFORM 1X8 LF (GAUZE/BANDAGES/DRESSINGS) ×2 IMPLANT
GLOVE BIO SURGEON STRL SZ7.5 (GLOVE) ×3 IMPLANT
GLOVE BIOGEL PI IND STRL 8 (GLOVE) ×1 IMPLANT
GLOVE BIOGEL PI INDICATOR 8 (GLOVE) ×1
GOWN STRL REUS W/ TWL LRG LVL3 (GOWN DISPOSABLE) ×1 IMPLANT
GOWN STRL REUS W/ TWL XL LVL3 (GOWN DISPOSABLE) ×1 IMPLANT
GOWN STRL REUS W/TWL LRG LVL3 (GOWN DISPOSABLE) ×2
GOWN STRL REUS W/TWL XL LVL3 (GOWN DISPOSABLE) ×2
HANDPIECE INTERPULSE COAX TIP (DISPOSABLE)
KIT BASIN OR (CUSTOM PROCEDURE TRAY) ×2 IMPLANT
KIT ROOM TURNOVER OR (KITS) ×2 IMPLANT
LOOP VESSEL MAXI BLUE (MISCELLANEOUS) IMPLANT
LOOP VESSEL MINI RED (MISCELLANEOUS) IMPLANT
MANIFOLD NEPTUNE II (INSTRUMENTS) ×2 IMPLANT
NDL HYPO 25X1 1.5 SAFETY (NEEDLE) IMPLANT
NDL SAFETY ECLIPSE 18X1.5 (NEEDLE) IMPLANT
NEEDLE HYPO 18GX1.5 SHARP (NEEDLE)
NEEDLE HYPO 25X1 1.5 SAFETY (NEEDLE) ×2 IMPLANT
NS IRRIG 1000ML POUR BTL (IV SOLUTION) ×2 IMPLANT
PACK ORTHO EXTREMITY (CUSTOM PROCEDURE TRAY) ×2 IMPLANT
PAD ARMBOARD 7.5X6 YLW CONV (MISCELLANEOUS) ×4 IMPLANT
PAD CAST 3X4 CTTN HI CHSV (CAST SUPPLIES) ×1 IMPLANT
PAD CAST 4YDX4 CTTN HI CHSV (CAST SUPPLIES) IMPLANT
PADDING CAST ABS 4INX4YD NS (CAST SUPPLIES) ×1
PADDING CAST ABS COTTON 4X4 ST (CAST SUPPLIES) ×1 IMPLANT
PADDING CAST COTTON 3X4 STRL (CAST SUPPLIES) ×2
PADDING CAST COTTON 4X4 STRL (CAST SUPPLIES) ×2
SCRUB BETADINE 4OZ XXX (MISCELLANEOUS) ×2 IMPLANT
SET HNDPC FAN SPRY TIP SCT (DISPOSABLE) IMPLANT
SOLUTION BETADINE 4OZ (MISCELLANEOUS) ×2 IMPLANT
SPEAR EYE SURG WECK-CEL (MISCELLANEOUS) ×3 IMPLANT
SPLINT PLASTER CAST XFAST 3X15 (CAST SUPPLIES) IMPLANT
SPLINT PLASTER XTRA FASTSET 3X (CAST SUPPLIES) ×1
SPONGE LAP 18X18 X RAY DECT (DISPOSABLE) ×2 IMPLANT
SPONGE LAP 4X18 X RAY DECT (DISPOSABLE) ×2 IMPLANT
STOCKINETTE 4X48 STRL (DRAPES) ×2 IMPLANT
SUCTION FRAZIER TIP 10 FR DISP (SUCTIONS) ×2 IMPLANT
SUT ETHIBOND 3-0 V-5 (SUTURE) IMPLANT
SUT ETHILON 4 0 PS 2 18 (SUTURE) ×4 IMPLANT
SUT ETHILON 9 0 BV130 4 (SUTURE) ×1 IMPLANT
SUT FIBERWIRE 4-0 18 TAPR NDL (SUTURE) ×2
SUT MERSILENE 6 0 P 1 (SUTURE) IMPLANT
SUT MON AB 5-0 P3 18 (SUTURE) IMPLANT
SUT NYLON 9 0 VRM6 (SUTURE) IMPLANT
SUT PROLENE 6 0 P 1 18 (SUTURE) ×1 IMPLANT
SUT SILK 2 0 SH (SUTURE) ×1 IMPLANT
SUT SILK 4 0 PS 2 (SUTURE) IMPLANT
SUT VICRYL 4-0 PS2 18IN ABS (SUTURE) IMPLANT
SUTURE FIBERWR 4-0 18 TAPR NDL (SUTURE) IMPLANT
SYR 20CC LL (SYRINGE) ×1 IMPLANT
SYR BULB 3OZ (MISCELLANEOUS) ×1 IMPLANT
SYR CONTROL 10ML LL (SYRINGE) IMPLANT
TOWEL OR 17X24 6PK STRL BLUE (TOWEL DISPOSABLE) ×4 IMPLANT
TOWEL OR 17X26 10 PK STRL BLUE (TOWEL DISPOSABLE) ×2 IMPLANT
TUBE ANAEROBIC SPECIMEN COL (MISCELLANEOUS) IMPLANT
TUBE CONNECTING 12X1/4 (SUCTIONS) ×2 IMPLANT
TUBE FEEDING 5FR 15 INCH (TUBING) IMPLANT
UNDERPAD 30X30 INCONTINENT (UNDERPADS AND DIAPERS) ×2 IMPLANT
WATER STERILE IRR 1000ML POUR (IV SOLUTION) ×2 IMPLANT
YANKAUER SUCT BULB TIP NO VENT (SUCTIONS) ×2 IMPLANT

## 2014-07-17 NOTE — ED Notes (Signed)
Patient being taken to Short Stay.

## 2014-07-17 NOTE — ED Notes (Signed)
Patient arrived in room.  

## 2014-07-17 NOTE — ED Notes (Signed)
PT's left hand cleaned with saline and wet saline dressing applied to left hand index finger.

## 2014-07-17 NOTE — ED Notes (Signed)
Unknown last tetanus

## 2014-07-17 NOTE — ED Notes (Signed)
Obtained Consent. Patient placed in gown.

## 2014-07-17 NOTE — ED Notes (Signed)
Paged MD Judd Lienelo and MD Merlyn LotKuzma of patient's arrival.

## 2014-07-17 NOTE — Anesthesia Procedure Notes (Signed)
Procedure Name: LMA Insertion Date/Time: 07/17/2014 10:40 PM Performed by: Molli HazardGORDON, Marika Mahaffy M Pre-anesthesia Checklist: Patient identified, Emergency Drugs available, Suction available and Patient being monitored Patient Re-evaluated:Patient Re-evaluated prior to inductionOxygen Delivery Method: Circle system utilized Preoxygenation: Pre-oxygenation with 100% oxygen Intubation Type: IV induction Ventilation: Mask ventilation without difficulty LMA: LMA inserted LMA Size: 4.0 Number of attempts: 2 Tube secured with: Tape Dental Injury: Teeth and Oropharynx as per pre-operative assessment

## 2014-07-17 NOTE — H&P (Signed)
[name] is an 49 y.o. female.   Chief Complaint: left index finger saw injury HPI: 49 yo rhd female states she injured left index finger on table saw ~ 5 PM today.  Seen at [facility] and transferred for further care.  Reports no previous injury to left index and no other injury at this time.  Has had digital block by [facility].  Past Medical History  Diagnosis Date  . Depression   . Anxiety   . COPD (chronic obstructive pulmonary disease)   . 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 Tubal ligation    . Partial hysterectomy      Family History  Problem Relation Age of Onset  . Hypertension Sister   . Depression Sister   . Drug abuse Sister   . Hypertension Mother   . Osteoporosis Mother   . Diabetes Mother   . Alcohol abuse Mother   . Depression Mother   . Heart disease Father     MI  . Depression Father   . Depression Sister   . Colon cancer Neg Hx    Social History:  reports that she has been smoking Cigarettes.  She has a 30 pack-year smoking history. She has never used smokeless tobacco. She reports that she drinks alcohol. She reports that she does not use illicit drugs.  Allergies:  Allergies  Allergen Reactions  . Effexor [Venlafaxine Hcl] Other (See Comments)    Dizziness, pain behind eye  . Ampicillin Rash  . Neomycin Rash  . Penicillins Rash     (Not in a hospital admission)  No results found for this or any previous visit (from the past 48 hour(s)).  Dg Finger Index Left  07/17/2014   CLINICAL DATA:  Cut finger with table saw, large deep at laceration to LEFT index finger  EXAM: LEFT INDEX FINGER 2+V  COMPARISON:  None  FINDINGS: Dressing artifacts.  Osseous mineralization normal.  Joint spaces preserved.  Large bony defect identified at the radial margin at the base of the middle phalanx LEFT index finger consistent with fracture.  No definite articular extension.  Associated soft tissue deformity, gas and  swelling.  No additional fracture dislocation.  IMPRESSION: Fracture with bony defect at the radial margin, base of middle phalanx LEFT index finger.   Electronically Signed   By: Ulyses SouthwardMark  Boles M.D.   On: 07/17/2014 18:06     A comprehensive review of systems was negative.  Blood pressure 108/67, pulse 57, temperature 98.1 F (36.7 C), temperature source Oral, resp. rate 16, height 5\' 6"  (1.676 m), weight 45.36 kg (100 lb), SpO2 100 %.  General appearance: alert, cooperative and appears stated age Head: Normocephalic, without obvious abnormality, atraumatic Neck: supple, symmetrical, trachea midline Resp: clear to auscultation bilaterally Cardio: regular rate and rhythm GI: non tender Extremities: intact sensation and capillary refill all digits except left index.  +epl/fpl/io.  right ue: no wounds or ttp.  left ue: decreased sensation in index fingertip.  brisk capillary refill in index fingertip.  laceration at volar middle phalanx.  radial side coursing to ulnar side in u shape.  no gross contamination.  unable to flex dip.  can flex pip.   Pulses: 2+ and symmetric Skin: Skin color, texture, turgor normal. No rashes or lesions Neurologic: Grossly normal except as above Incision/Wound: none  Assessment/Plan Left index finger saw injury.  Recommend OR for exploration, irrigation and debridement and repair tendon/artery/nerve as  necessary.  Risks, benefits, and alternatives of surgery were discussed and the patient agrees with the plan of care.   Erleen Egner R 07/17/2014, 10:05 PM

## 2014-07-17 NOTE — ED Notes (Signed)
MD Kuzma at the bedside.

## 2014-07-17 NOTE — ED Notes (Signed)
Patient presents with large/deep laceration to left index finger, cut with a saw. Moderate amount of bleeding patient in acute pain, tearful.

## 2014-07-17 NOTE — ED Notes (Signed)
Patient coming from [facility]. Patient alert and oriented x4. Patient is being transported to see Dr. Merlyn LotKuzma. Patient was using a table saw when she came back with her left hand and the second digit came in contact with unprotected blade. Digit is still in place, and [facility] was completed [facility]. Patient has spontaneous cap refill to the nail and patient reports pain with tingling in first three digits. Patient reports pain 2/10.

## 2014-07-17 NOTE — ED Provider Notes (Signed)
CSN: [id number]     Arrival date & time 07/17/14  1652 History   First MD Initiated Contact with Patient 07/17/14 1708     Chief Complaint  Patient presents with  . Laceration     (Consider location/radiation/quality/duration/timing/severity/associated sxs/prior Treatment) HPI   [name] is a 49 y.o. female who was working with a table saw when she cut her left index finger. No other injuries. She was brought in ambulatory by her husband. She has severe pain in her left index finger. Last oral intake was fluids at 3 PM. She did not eat anything at all yet today. There are no other no modifying factors.   Past Medical History  Diagnosis Date  . Depression   . Anxiety   . COPD (chronic obstructive pulmonary disease)   . 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 Tubal ligation    . Partial hysterectomy     Family History  Problem Relation Age of Onset  . Hypertension Sister   . Depression Sister   . Drug abuse Sister   . Hypertension Mother   . Osteoporosis Mother   . Diabetes Mother   . Alcohol abuse Mother   . Depression Mother   . Heart disease Father     MI  . Depression Father   . Depression Sister   . Colon cancer Neg Hx    History  Substance Use Topics  . Smoking status: Current Every Day Smoker -- 1.00 packs/day for 30 years    Types: Cigarettes  . Smokeless tobacco: Never Used  . Alcohol Use: Yes     Comment: occasionally   OB History    No data available     Review of Systems  All other systems reviewed and are negative.     Allergies  Effexor; Ampicillin; Neomycin; and Penicillins  Home Medications   Prior to Admission medications   Medication Sig Start Date End Date Taking? Authorizing Provider  albuterol (PROVENTIL HFA;VENTOLIN HFA) 108 (90 BASE) MCG/ACT inhaler Inhale 2 puffs into the lungs every 4 (four) hours as needed for wheezing.   Yes Historical Provider, MD  ALPRAZolam (XANAX)  0.5 MG tablet TAKE (1) TABLET BY MOUTH THREE TIMES DAILY AS NEEDED FOR ANXIETY. MONTHLY REFILL. 01/08/14  Yes Campbell Richesarolyn C Hoskins, NP  DULoxetine (CYMBALTA) 60 MG capsule TAKE 1 CAPSULE BY MOUTH EVERY DAY Patient taking differently: Take 60 mg by mouth at bedtime. TAKE 1 CAPSULE BY MOUTH EVERY DAY 01/08/14  Yes Campbell Richesarolyn C Hoskins, NP  ENSURE (ENSURE) Take 237 mLs by mouth 2 (two) times daily.   Yes Historical Provider, MD  buPROPion (WELLBUTRIN XL) 150 MG 24 hr tablet Take 1 tablet (150 mg total) by mouth daily. Patient not taking: Reported on 07/17/2014 01/08/14   Campbell Richesarolyn C Hoskins, NP   BP 108/67 mmHg  Pulse 57  Temp(Src) 98.1 F (36.7 C) (Oral)  Resp 16  Ht 5\' 6"  (1.676 m)  Wt 100 lb (45.36 kg)  BMI 16.15 kg/m2  SpO2 100% Physical Exam  Constitutional: She is oriented to person, place, and time. She appears well-developed and well-nourished. Distressed: She is in severe pain.  HENT:  Head: Normocephalic and atraumatic.  Right Ear: External ear normal.  Left Ear: External ear normal.  Eyes: Conjunctivae and EOM are normal. Pupils are equal, round, and reactive to light.  Neck: Normal range of motion and phonation normal. Neck supple.  Cardiovascular: Normal rate.  Pulmonary/Chest: Effort normal. She exhibits no bony tenderness.  Musculoskeletal:  Deep, full or laceration mid index finger, left hand. Laceration is primarily horizontal. The distal finger appears somewhat dusky, and she is unable to flex the finger at all. The patient is unable to sense any touch to the distal left finger.  Neurological: She is alert and oriented to person, place, and time. No cranial nerve deficit or sensory deficit. She exhibits normal muscle tone. Coordination normal.  Skin: Skin is warm, dry and intact.  Psychiatric: She has a normal mood and affect. Her behavior is normal. Judgment and thought content normal.  Nursing note and vitals reviewed.   ED Course  Procedures (including critical care  time)  Medications  0.9 %  sodium chloride infusion ( Intravenous New Bag/Given 07/17/14 1900)  bupivacaine (MARCAINE) 0.5 % injection 10 mL (10 mLs Infiltration Given by Other 07/17/14 1714)  fentaNYL (SUBLIMAZE) injection 100 mcg (100 mcg Intravenous Given 07/17/14 1900)  ondansetron (ZOFRAN) injection 4 mg (4 mg Intravenous Given 07/17/14 1900)  ceFAZolin (ANCEF) IVPB 1 g/50 mL premix (0 g Intravenous Stopped 07/17/14 1942)  Tdap (BOOSTRIX) injection 0.5 mL (0.5 mLs Intramuscular Given 07/17/14 2032)    Patient Vitals for the past 24 hrs:  BP Temp Temp src Pulse Resp SpO2 Height Weight  07/17/14 2145 108/67 mmHg - - (!) 57 - 100 % - -  07/17/14 2121 96/58 mmHg - - (!) 55 16 98 % - -  07/17/14 2036 111/68 mmHg - - 67 16 97 % - -  07/17/14 2000 116/71 mmHg - - (!) 49 - 96 % - -  07/17/14 1930 121/78 mmHg - - (!) 57 - 96 % - -  07/17/14 1924 105/65 mmHg 98.1 F (36.7 C) Oral 64 20 93 % - -  07/17/14 1900 117/62 mmHg - - 62 - 96 % - -  07/17/14 1854 - - - (!) 59 - 99 % - -  07/17/14 1853 111/78 mmHg - - - - - - -  07/17/14 1703 (!) 148/107 mmHg 98 F (36.7 C) Oral 99 20 100 %  (1.676 m) 100 lb (45.36 kg)    17:15- Sensorcaine digital block, index finger, left hand  18:24- initiated page to hand surgery for transfer, to get definitive treatment. Dr. Merlyn Lot agrees to evaluate the patient, at [facility] ED  6:52 PM Reevaluation with update and discussion. After initial assessment and treatment, an updated evaluation reveals she is much more comfortable. She complains of a "throbbing" sensation in her left finger. I'm able to passively flex the affected finger without pain. Capillary refill is less than 2 seconds in the tip of the left distal finger, bilaterally. Family and patient updated on findings, and plan. [name]    Labs Review Labs Reviewed  CBC  BASIC METABOLIC PANEL    Imaging Review Dg Finger Index Left  07/17/2014   CLINICAL DATA:  Cut finger with table saw, large  deep at laceration to LEFT index finger  EXAM: LEFT INDEX FINGER 2+V  COMPARISON:  None  FINDINGS: Dressing artifacts.  Osseous mineralization normal.  Joint spaces preserved.  Large bony defect identified at the radial margin at the base of the middle phalanx LEFT index finger consistent with fracture.  No definite articular extension.  Associated soft tissue deformity, gas and swelling.  No additional fracture dislocation.  IMPRESSION: Fracture with bony defect at the radial margin, base of middle phalanx LEFT index finger.   Electronically Signed  By: Ulyses Southward M.D.   On: 07/17/2014 18:06     EKG Interpretation None      MDM   Final diagnoses:  Complicated laceration of finger, initial encounter    Complicated finger injury with laceration, tendon injury, and nerve injury. Probable vascular injury as well with preserved distal blood flow.  Plan: Transferred to [facility] emergency department  Mancel Bale, MD 07/17/14 2206

## 2014-07-17 NOTE — Anesthesia Preprocedure Evaluation (Addendum)
Anesthesia Evaluation  Patient identified by MRN, date of birth, ID band Patient awake    Reviewed: Allergy & Precautions, NPO status , Patient's Chart, lab work & pertinent test results  Airway Mallampati: II  TM Distance: >3 FB Neck ROM: Full    Dental  (+) Teeth Intact, Partial Upper, Caps, Dental Advisory Given   Pulmonary COPDCurrent Smoker,  breath sounds clear to auscultation        Cardiovascular Rhythm:Regular Rate:Normal     Neuro/Psych Anxiety Depression    GI/Hepatic   Endo/Other    Renal/GU      Musculoskeletal   Abdominal   Peds  Hematology   Anesthesia Other Findings   Reproductive/Obstetrics                           Anesthesia Physical Anesthesia Plan  ASA: II and emergent  Anesthesia Plan: General   Post-op Pain Management:    Induction: Intravenous  Airway Management Planned: LMA  Additional Equipment:   Intra-op Plan:   Post-operative Plan: Extubation in OR  Informed Consent:   Plan Discussed with: Anesthesiologist, Surgeon and CRNA  Anesthesia Plan Comments:        Anesthesia Quick Evaluation

## 2014-07-18 ENCOUNTER — Encounter (HOSPITAL_COMMUNITY): Payer: Self-pay | Admitting: Orthopedic Surgery

## 2014-07-18 MED ORDER — OXYCODONE HCL 5 MG PO TABS: 5.0000 mg | ORAL_TABLET | Freq: Once | ORAL | Status: DC | PRN

## 2014-07-18 MED ORDER — ONDANSETRON HCL 4 MG/2ML IJ SOLN
INTRAMUSCULAR | Status: AC
  Filled 2014-07-18: qty 2

## 2014-07-18 MED ORDER — SULFAMETHOXAZOLE-TRIMETHOPRIM 800-160 MG PO TABS: 1.0000 | ORAL_TABLET | Freq: Two times a day (BID) | ORAL | Status: DC

## 2014-07-18 MED ORDER — PROMETHAZINE HCL 25 MG/ML IJ SOLN: 6.2500 mg | INTRAMUSCULAR | Status: DC | PRN

## 2014-07-18 MED ORDER — ASPIRIN EC 325 MG PO TBEC: 325.0000 mg | DELAYED_RELEASE_TABLET | Freq: Every day | ORAL | Status: DC

## 2014-07-18 MED ORDER — BUPIVACAINE HCL (PF) 0.25 % IJ SOLN
INTRAMUSCULAR | Status: DC | PRN
  Administered 2014-07-18: 7 mL

## 2014-07-18 MED ORDER — OXYCODONE-ACETAMINOPHEN 5-325 MG PO TABS: ORAL_TABLET | ORAL | Status: DC

## 2014-07-18 MED ORDER — ONDANSETRON HCL 4 MG/2ML IJ SOLN
INTRAMUSCULAR | Status: DC | PRN
  Administered 2014-07-18: 4 mg via INTRAVENOUS

## 2014-07-18 MED ORDER — HYDROMORPHONE HCL 1 MG/ML IJ SOLN: 0.2500 mg | INTRAMUSCULAR | Status: DC | PRN

## 2014-07-18 MED ORDER — OXYCODONE HCL 5 MG/5ML PO SOLN: 5.0000 mg | Freq: Once | ORAL | Status: DC | PRN

## 2014-07-18 NOTE — Op Note (Signed)
[id number] 

## 2014-07-18 NOTE — Anesthesia Postprocedure Evaluation (Signed)
  Anesthesia Post-op Note  Patient: Susan DavenportPaige H Goetze  Procedure(s) Performed: Procedure(s): IRRIGATION AND DEBRIDEMENT left index finger (Left) NERVE, TENDON AND ARTERY REPAIR LEFT INDEX FINGER (Left)  Patient Location: PACU  Anesthesia Type:General  Level of Consciousness: awake, alert  and sedated  Airway and Oxygen Therapy: Patient Spontanous Breathing  Post-op Pain: mild  Post-op Assessment: Post-op Vital signs reviewed  Post-op Vital Signs: stable  Last Vitals:  Filed Vitals:   07/18/14 0135  BP:   Pulse: 71  Temp: 36.6 C  Resp: 11    Complications: No apparent anesthesia complications

## 2014-07-18 NOTE — Discharge Instructions (Signed)

## 2014-07-18 NOTE — Transfer of Care (Signed)
Immediate Anesthesia Transfer of Care Note  Patient: [name]  Procedure(s) Performed: Procedure(s): IRRIGATION AND DEBRIDEMENT left index finger (Left) NERVE, TENDON AND ARTERY REPAIR LEFT INDEX FINGER (Left)  Patient Location: PACU  Anesthesia Type:General  Level of Consciousness: awake, sedated and patient cooperative  Airway & Oxygen Therapy: Patient connected to nasal cannula oxygen  Post-op Assessment: Report given to RN, Post -op Vital signs reviewed and stable and Patient moving all extremities X 4  Post vital signs: Reviewed and stable  Last Vitals:  Filed Vitals:   07/17/14 2145  BP: 108/67  Pulse: 57  Temp:   Resp:     Complications: No apparent anesthesia complications

## 2014-07-18 NOTE — Op Note (Signed)
NAMConni Pacheco:  Pacheco, [name]                 ACCOUNT NO.:  [id number]  MEDICAL RECORD NO.:  [id number]  LOCATION:  [facility]                         FACILITY:  [facility]  PHYSICIAN:  Susan LoaKevin Nesanel Aguila, MD        DATE OF BIRTH:  May 21, 1965  DATE OF PROCEDURE:  07/17/2014 DATE OF DISCHARGE:  07/18/2014                              OPERATIVE REPORT   PREOPERATIVE DIAGNOSIS:  Left index finger tendon or nerve laceration.  POSTOPERATIVE DIAGNOSIS:  Left index finger FDP and FDS zone 2 lacerations radial, digital nerve and artery laceration, ulnar digital nerve laceration.  Open middle phalanx fracture.  PROCEDURE:   1. Irrigation and debridement of left index finger open middle phalanx fracture.   2. Repair of left index finger zone 2 FDP laceration. 3. Repair of left index finger zone 2 FDS laceration.   4. Repair of left index finger radial digital artery under microscope.   5. Repair of left index finger radial digital nerve under microscope. 6. Repair of left index finger ulnar digital nerve under microscope.  SURGEON:  Susan LoaKevin Yohannes Waibel, MD.  ASSISTANT:  None.  ANESTHESIA:  General.  IV FLUIDS:  Per anesthesia flow sheet.  ESTIMATED BLOOD LOSS:  Minimal.  COMPLICATIONS:  None.  SPECIMENS:  None.  TOURNIQUET TIME:  111 minutes total.  DISPOSITION:  Stable to PACU.  INDICATIONS:  Ms. [name] is a 49 year old right-hand-dominant female who lacerated her left index finger on a table saw on the evening of July 17, 2014.  She presented to [facility] where she was evaluated.  I was consulted for management of the injury.  [facility] and management.  On examination, she had decreased sensation in the tip of the left index finger.  She had brisk capillary refill.  There is laceration to volar aspect of the index finger over the middle phalanx.  Radiographs showed a loss of bone at the radial side of the middle phalanx.  I recommended going to  the operating room for irrigation and debridement of the wound and open fracture, repair of tendon, artery, and nerve as necessary. Risks, benefits, and alternatives of surgery were discussed including risk of blood loss, infection, damage to nerves, vessels, tendons, ligaments, bone; failure of surgery; need for additional surgery, complications with wound healing, continued pain, and stiffness.  She voiced understanding of these risks and elected to proceed.  OPERATIVE COURSE:  After being identified preoperatively by myself, the patient and I agreed upon procedure and site of procedure.  Surgical site was marked.  The risks, benefits, and alternatives of surgery were reviewed and she wished to proceed.  Surgical consent had been signed. Her tetanus had been updated by the emergency department.  She was given IV Ancef as preoperative antibiotic coverage.  She was transported to the operating room and placed on the operating room table in a supine position with left upper extremity on arm board.  General anesthesia was induced by anesthesiologist.  Left upper extremity was prepped and draped in normal sterile orthopedic fashion.  Surgical pause was performed between surgeons, anesthesia, and operating room staff, and all were in agreement as  to the patient, procedure, and site of procedure. Tourniquet at the proximal aspect of the extremity was inflated to 250 mmHg after exsanguination of the limb with an Esmarch bandage.  The wound was explored.  There was laceration of the ulnar digital nerve. The ulnar digital artery was intact.  The radial digital nerve and artery had been lacerated.  The FDP was 100% lacerated.  The FDS was 100% lacerated on the radial side and 50% lacerated on the ulnar side. The wound coursed down to the middle phalanx, and there was some bone loss at the radial side.  The middle phalanx was not unstable.  The wound was copiously irrigated including the open  fracture with 1000 mL of sterile saline by bulb syringe.  One piece of what appeared to be wood was removed.  The FDS radial and ulnar arms were repaired with a 4- 0 FiberWire suture in a figure-of-eight fashion.  This apposed the tendon edges well.  The FDP tendon was able to be retrieved from proximally.  It was repaired using the 4-0 FiberWire suture in a modified Kessler technique.  A four-strand repair was achieved.  This was augmented with a running Epitendinous 6-0 Prolene suture.  The very proximal aspect of the A4 pulley was vented to allow better motion of the tendon underneath.  The repair was just proximal to the A4 pulley. The microscope was brought in.  The radial digital nerve and artery were addressed first.  The arterial ends were found and cleared of soft tissue. The tourniquet had to be deflated for approximately 1 minute to locate the distal ends.  The ends were freshened and cleared of interposition. A 9-0 nylon suture was used in an interrupted circumferential fashion to repair the arterial ends.  Good apposition was obtained.  The digital nerve was then repaired again in an interrupted circumferential fashion. Good apposition of the nerve ends was achieved.  Attention was turned to the ulnar digital nerve.  Again, the 9-0 nylon suture was used in an interrupted circumferential fashion to repair the nerve ends.  The wound was then copiously irrigated with sterile saline.  It was closed with 4- 0 nylon in a horizontal mattress fashion.  A digital block was performed with 7 mL of 0.25% plain Marcaine to aid in postoperative analgesia. The wound was dressed with sterile Xeroform, 4x4s, and wrapped with a Kerlix bandage.  The tourniquet was deflated at a total of 111 minutes. The fingertips were all pink with brisk capillary refill after deflation of the tourniquet.  Dorsal blocking splint was placed with the wrist flexed approximately 30-40 degrees, the MPs flexed, and  the IPs extended.  This was wrapped with Kerlix and Ace bandage.  The operative drapes were broken down, and the patient was awoken from anesthesia safely.  She was transferred back to stretcher and taken to PACU in stable condition.  I will see her back in the office in 1 week for postoperative followup.  I will give Percocet 5/325, one to two p.o. q.6 hours p.r.n. pain, dispensed #40, and Bactrim DS 1 p.o. b.i.d. x7 days.     Susan Loa, MD     KK/MEDQ  D:  07/18/2014  T:  07/18/2014  Job:  [id number]

## 2014-07-18 NOTE — Brief Op Note (Signed)
07/17/2014 - 07/18/2014  12:52 AM  PATIENT:  [name]  49 y.o. female  PRE-OPERATIVE DIAGNOSIS:  left index finger laceration  POST-OPERATIVE DIAGNOSIS:  left index finger laceration  PROCEDURE:  Procedure(s): IRRIGATION AND DEBRIDEMENT left index finger (Left) NERVE, TENDON AND ARTERY REPAIR LEFT INDEX FINGER (Left)  SURGEON:  Surgeon(s) and Role:    * Betha LoaKevin Creed Kail, MD - Primary  PHYSICIAN ASSISTANT:   ASSISTANTS: none   ANESTHESIA:   general  EBL:     BLOOD ADMINISTERED:none  DRAINS: none   LOCAL MEDICATIONS USED:  MARCAINE     SPECIMEN:  No Specimen  DISPOSITION OF SPECIMEN:  N/A  COUNTS:  YES  TOURNIQUET:   Total Tourniquet Time Documented: Upper Arm (Left) - 66 minutes Upper Arm (Left) - 45 minutes Total: Upper Arm (Left) - 111 minutes   DICTATION: .Other Dictation: Dictation Number [id number]  PLAN OF CARE: Discharge to home after PACU  PATIENT DISPOSITION:  PACU - hemodynamically stable.

## 2014-07-21 ENCOUNTER — Encounter: Payer: Self-pay | Admitting: Nurse Practitioner

## 2014-07-21 ENCOUNTER — Ambulatory Visit (INDEPENDENT_AMBULATORY_CARE_PROVIDER_SITE_OTHER): Payer: BLUE CROSS/BLUE SHIELD | Admitting: Nurse Practitioner

## 2014-07-21 VITALS — BP 118/80 | Ht 67.0 in | Wt 100.0 lb

## 2014-07-21 DIAGNOSIS — F329 Major depressive disorder, single episode, unspecified: Secondary | ICD-10-CM

## 2014-07-21 DIAGNOSIS — F419 Anxiety disorder, unspecified: Secondary | ICD-10-CM

## 2014-07-21 DIAGNOSIS — F32A Depression, unspecified: Secondary | ICD-10-CM | POA: Insufficient documentation

## 2014-07-21 MED ORDER — ALPRAZOLAM 0.5 MG PO TABS: ORAL_TABLET | ORAL | Status: DC

## 2014-07-21 MED ORDER — DULOXETINE HCL 60 MG PO CPEP: ORAL_CAPSULE | ORAL | Status: DC

## 2014-07-26 ENCOUNTER — Encounter: Payer: Self-pay | Admitting: Nurse Practitioner

## 2014-07-26 NOTE — Progress Notes (Signed)
Subjective:  Presents for routine follow up. Uses Xanax mainly for sleep. Some during the day with extreme anxiety. Overall doing well with Cymbalta. Had surgery a few days ago for hand laceration from a table saw.   Objective:   BP 118/80 mmHg  Ht 5\' 7"  (1.702 m)  Wt 100 lb (45.36 kg)  BMI 15.66 kg/m2 NAD. Alert, oriented. Lungs clear. Heart RRR. Weight stable.   Assessment:  Problem List Items Addressed This Visit      Other   Anxiety - Primary   Relevant Medications   DULoxetine (CYMBALTA) 60 MG capsule   ALPRAZolam (XANAX) 0.5 MG tablet   Depression   Relevant Medications   DULoxetine (CYMBALTA) 60 MG capsule   ALPRAZolam (XANAX) 0.5 MG tablet     Plan: Meds ordered this encounter  Medications  . DULoxetine (CYMBALTA) 60 MG capsule    Sig: TAKE 1 CAPSULE BY MOUTH EVERY DAY    Dispense:  90 capsule    Refill:  1    Order Specific Question:  Supervising Provider    Answer:  Merlyn AlbertLUKING, WILLIAM S [[id number]]  . ALPRAZolam (XANAX) 0.5 MG tablet    Sig: TAKE (1) TABLET BY MOUTH THREE TIMES DAILY AS NEEDED FOR ANXIETY. MONTHLY REFILL.    Dispense:  75 tablet    Refill:  5    Order Specific Question:  Supervising Provider    Answer:  Riccardo DubinLUKING, WILLIAM S [[id number]]   Strongly recommend preventive health physical. Return in about 6 months (around 01/20/2015).

## 2014-10-23 ENCOUNTER — Emergency Department (HOSPITAL_COMMUNITY): Payer: BLUE CROSS/BLUE SHIELD

## 2014-10-23 ENCOUNTER — Encounter (HOSPITAL_COMMUNITY): Payer: Self-pay | Admitting: *Deleted

## 2014-10-23 ENCOUNTER — Observation Stay (HOSPITAL_COMMUNITY)
Admission: EM | Admit: 2014-10-23 | Discharge: 2014-10-24 | Disposition: A | Discharge status: Home / Self Care | Payer: BLUE CROSS/BLUE SHIELD | Attending: Internal Medicine | Admitting: Internal Medicine

## 2014-10-23 ENCOUNTER — Observation Stay (HOSPITAL_COMMUNITY): Payer: BLUE CROSS/BLUE SHIELD

## 2014-10-23 DIAGNOSIS — F329 Major depressive disorder, single episode, unspecified: Secondary | ICD-10-CM | POA: Insufficient documentation

## 2014-10-23 DIAGNOSIS — W1839XA Other fall on same level, initial encounter: Secondary | ICD-10-CM | POA: Insufficient documentation

## 2014-10-23 DIAGNOSIS — R55 Syncope and collapse: Secondary | ICD-10-CM | POA: Diagnosis present

## 2014-10-23 DIAGNOSIS — Z720 Tobacco use: Secondary | ICD-10-CM | POA: Diagnosis not present

## 2014-10-23 DIAGNOSIS — R569 Unspecified convulsions: Principal | ICD-10-CM | POA: Insufficient documentation

## 2014-10-23 DIAGNOSIS — Z79899 Other long term (current) drug therapy: Secondary | ICD-10-CM | POA: Diagnosis not present

## 2014-10-23 DIAGNOSIS — Y93G1 Activity, food preparation and clean up: Secondary | ICD-10-CM | POA: Insufficient documentation

## 2014-10-23 DIAGNOSIS — Z880 Allergy status to penicillin: Secondary | ICD-10-CM | POA: Insufficient documentation

## 2014-10-23 DIAGNOSIS — Y9289 Other specified places as the place of occurrence of the external cause: Secondary | ICD-10-CM | POA: Diagnosis not present

## 2014-10-23 DIAGNOSIS — F419 Anxiety disorder, unspecified: Secondary | ICD-10-CM | POA: Diagnosis not present

## 2014-10-23 DIAGNOSIS — J449 Chronic obstructive pulmonary disease, unspecified: Secondary | ICD-10-CM | POA: Insufficient documentation

## 2014-10-23 DIAGNOSIS — S00511A Abrasion of lip, initial encounter: Secondary | ICD-10-CM | POA: Insufficient documentation

## 2014-10-23 DIAGNOSIS — Z8619 Personal history of other infectious and parasitic diseases: Secondary | ICD-10-CM | POA: Diagnosis not present

## 2014-10-23 DIAGNOSIS — Y998 Other external cause status: Secondary | ICD-10-CM | POA: Insufficient documentation

## 2014-10-23 DIAGNOSIS — Z7982 Long term (current) use of aspirin: Secondary | ICD-10-CM | POA: Diagnosis not present

## 2014-10-23 HISTORY — DX: Unspecified convulsions: R56.9

## 2014-10-23 LAB — CBC
HCT: 36.7 % (ref 36.0–46.0)
Hemoglobin: 12.2 g/dL (ref 12.0–15.0)
MCH: 32.8 pg (ref 26.0–34.0)
MCHC: 33.2 g/dL (ref 30.0–36.0)
MCV: 98.7 fL (ref 78.0–100.0)
PLATELETS: 202 10*3/uL (ref 150–400)
RBC: 3.72 MIL/uL — AB (ref 3.87–5.11)
RDW: 12.7 % (ref 11.5–15.5)
WBC: 7.5 10*3/uL (ref 4.0–10.5)

## 2014-10-23 LAB — BASIC METABOLIC PANEL
Anion gap: 9 (ref 5–15)
BUN: 13 mg/dL (ref 6–20)
CALCIUM: 9.2 mg/dL (ref 8.9–10.3)
CO2: 27 mmol/L (ref 22–32)
Chloride: 103 mmol/L (ref 101–111)
Creatinine, Ser: 0.95 mg/dL (ref 0.44–1.00)
GFR calc Af Amer: >60 mL/min (ref >60)
GFR calc non Af Amer: >60 mL/min (ref >60)
Glucose, Bld: 116 mg/dL — ABNORMAL HIGH (ref 65–99)
Potassium: 4.4 mmol/L (ref 3.5–5.1)
SODIUM: 139 mmol/L (ref 135–145)

## 2014-10-23 LAB — CBG MONITORING, ED: GLUCOSE-CAPILLARY: 108 mg/dL — AB (ref 65–99)

## 2014-10-23 LAB — TSH: TSH: 1.391 u[IU]/mL (ref 0.350–4.500)

## 2014-10-23 MED ORDER — LEVETIRACETAM 250 MG PO TABS
250.0000 mg | ORAL_TABLET | Freq: Two times a day (BID) | ORAL | Status: DC
  Administered 2014-10-24 (×2): 250 mg via ORAL
  Filled 2014-10-23 (×2): qty 1

## 2014-10-23 MED ORDER — SODIUM CHLORIDE 0.9 % IJ SOLN
3.0000 mL | Freq: Two times a day (BID) | INTRAMUSCULAR | Status: DC
  Administered 2014-10-24: 3 mL via INTRAVENOUS

## 2014-10-23 MED ORDER — LORAZEPAM 2 MG/ML IJ SOLN
INTRAMUSCULAR | Status: AC
  Administered 2014-10-23: 2 mg
  Filled 2014-10-23: qty 1

## 2014-10-23 MED ORDER — PROCHLORPERAZINE EDISYLATE 5 MG/ML IJ SOLN
10.0000 mg | Freq: Once | INTRAMUSCULAR | Status: AC
  Administered 2014-10-23: 10 mg via INTRAVENOUS
  Filled 2014-10-23: qty 2

## 2014-10-23 MED ORDER — ENOXAPARIN SODIUM 40 MG/0.4ML ~~LOC~~ SOLN: 40.0000 mg | SUBCUTANEOUS | Status: DC

## 2014-10-23 MED ORDER — ONDANSETRON HCL 4 MG/2ML IJ SOLN: 4.0000 mg | Freq: Four times a day (QID) | INTRAMUSCULAR | Status: DC | PRN

## 2014-10-23 MED ORDER — LEVETIRACETAM IN NACL 1000 MG/100ML IV SOLN
1000.0000 mg | Freq: Once | INTRAVENOUS | Status: AC
  Administered 2014-10-23: 1000 mg via INTRAVENOUS
  Filled 2014-10-23: qty 100

## 2014-10-23 MED ORDER — LORAZEPAM 2 MG/ML IJ SOLN
INTRAMUSCULAR | Status: AC
  Filled 2014-10-23: qty 1

## 2014-10-23 MED ORDER — DIPHENHYDRAMINE HCL 50 MG/ML IJ SOLN
25.0000 mg | Freq: Once | INTRAMUSCULAR | Status: AC
  Administered 2014-10-23: 25 mg via INTRAVENOUS
  Filled 2014-10-23: qty 1

## 2014-10-23 MED ORDER — SODIUM CHLORIDE 0.9 % IV SOLN
INTRAVENOUS | Status: DC
  Administered 2014-10-23 – 2014-10-24 (×2): via INTRAVENOUS

## 2014-10-23 MED ORDER — ONDANSETRON HCL 4 MG PO TABS: 4.0000 mg | ORAL_TABLET | Freq: Four times a day (QID) | ORAL | Status: DC | PRN

## 2014-10-23 MED ORDER — LORAZEPAM 2 MG/ML IJ SOLN
1.0000 mg | INTRAMUSCULAR | Status: DC | PRN
  Filled 2014-10-23: qty 1

## 2014-10-23 NOTE — ED Notes (Signed)
Patient states she thinks she was vacuuming, but cannot recall what happened. Combative with EMS upon their arrival. Patient alert, tearful. Able to answer questions. IV established by EMS. Patient states headache.

## 2014-10-23 NOTE — ED Notes (Signed)
Registration called RNs to room due change in mental status. Registration reports patient was talking to them and then suddenly stopped talking. [facility], [facility] and myself, into room. Patient alert, gazing around room. Non-verbal and did not acknowledge conversation. Patient then had witnessed seizure activity, focal to right side. Activity lasted approximately 40 seconds. IV ativan 2 mg given. Patient combative with staff upon awakening.

## 2014-10-23 NOTE — ED Notes (Signed)
Admitting MD at bedside.

## 2014-10-23 NOTE — ED Notes (Signed)
Pt was found in fetal position while she was cleaning. Pt has bruise to her left eye. Pt is not on any blood thinners. EMS states that pt was combative in route.  Last thing pt remembers is vacuuming. Pt is alert but tearful at this time, calm and cooperative. NAD noted.

## 2014-10-23 NOTE — ED Provider Notes (Signed)
CSN: [id number]     Arrival date & time 10/23/14  1147 History   First MD Initiated Contact with Patient 10/23/14 1212     Chief Complaint  Patient presents with  . Loss of Consciousness     (Consider location/radiation/quality/duration/timing/severity/associated sxs/prior Treatment) Patient is a 49 y.o. female presenting with syncope.  Loss of Consciousness Episode history:  Single Most recent episode:  Today Duration: unknown. 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 Witnessed: no (unknown, man called EMS after he found her down)   Relieved by:  Nothing Worsened by:  Nothing tried Ineffective treatments:  None tried Associated symptoms: dizziness (light headedness) and recent fall (presumed fall with syncope)   Associated symptoms: no anxiety, no chest pain, no diaphoresis, no fever, no focal weakness, no headaches, no nausea, no recent injury, no seizures (unknown, no hx of, did bite lip, (had seizure like activity while in ED)), no shortness of breath, no vomiting and no weakness   Risk factors: no congenital heart disease, no coronary artery disease and no vascular disease     Past Medical History  Diagnosis Date  . Depression   . Anxiety   . COPD (chronic obstructive pulmonary disease)   . 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 Tubal ligation    . Partial hysterectomy    . I&d extremity Left 07/17/2014    Procedure: IRRIGATION AND DEBRIDEMENT left index finger;  Surgeon: Betha Loa, MD;  Location: [facility] OR;  Service: Orthopedics;  Laterality: Left;  . Nerve, tendon and artery repair Left 07/17/2014    Procedure: NERVE, TENDON AND ARTERY REPAIR LEFT INDEX FINGER;  Surgeon: Betha Loa, MD;  Location: [facility] OR;  Service: Orthopedics;  Laterality: Left;   Family History  Problem Relation Age of Onset  . Hypertension Sister   .  Depression Sister   . Drug abuse Sister   . Hypertension Mother   . Osteoporosis Mother   . Diabetes Mother   . Alcohol abuse Mother   . Depression Mother   . Heart disease Father     MI  . Depression Father   . Depression Sister   . Colon cancer Neg Hx    History  Substance Use Topics  . Smoking status: Current Every Day Smoker -- 1.00 packs/day for 30 years    Types: Cigarettes  . Smokeless tobacco: Never Used  . Alcohol Use: Yes     Comment: occasionally   OB History    No data available     Review of Systems  Constitutional: Negative for fever and diaphoresis.  HENT: Negative for sore throat.   Eyes: Negative for visual disturbance.  Respiratory: Negative for cough and shortness of breath.   Cardiovascular: Positive for syncope. Negative for chest pain.  Gastrointestinal: Negative for nausea, vomiting and abdominal pain.  Genitourinary: Negative for difficulty urinating.  Musculoskeletal: Negative for back pain and neck pain.  Skin: Negative for rash.  Neurological: Positive for dizziness (light headedness). Negative for focal weakness, seizures (unknown, no hx of, did bite lip, (had seizure like activity while in ED)), syncope, weakness and headaches.      Allergies  Effexor; Ampicillin; Neomycin; and Penicillins  Home Medications   Prior to Admission medications   Medication Sig Start Date End Date Taking? Authorizing Provider  ALPRAZolam (XANAX) 0.5 MG tablet TAKE (1) TABLET BY MOUTH  THREE TIMES DAILY AS NEEDED FOR ANXIETY. MONTHLY REFILL. 07/21/14  Yes Campbell Riches, NP  DULoxetine (CYMBALTA) 60 MG capsule TAKE 1 CAPSULE BY MOUTH EVERY DAY 07/21/14  Yes Campbell Riches, NP  ENSURE (ENSURE) Take 237 mLs by mouth 2 (two) times daily.   Yes Historical Provider, MD  aspirin EC 325 MG tablet Take 1 tablet (325 mg total) by mouth daily. Patient not taking: Reported on 07/21/2014 07/18/14   Betha Loa, MD  sulfamethoxazole-trimethoprim (BACTRIM DS) 800-160 MG  per tablet Take 1 tablet by mouth 2 (two) times daily. Patient not taking: Reported on 10/23/2014 07/18/14   Betha Loa, MD   BP 116/79 mmHg  Pulse 73  Temp(Src) 97.8 F (36.6 C) (Oral)  Resp 14  SpO2 100% Physical Exam  Constitutional: She is oriented to person, place, and time. She appears well-developed and well-nourished. No distress.  HENT:  Head: Normocephalic.  Mouth/Throat: Oropharynx is clear and moist.  Lower Lip abrasion/contusion  Eyes: Conjunctivae and EOM are normal. Pupils are equal, round, and reactive to light.  Neck: Normal range of motion.  Cardiovascular: Normal rate, regular rhythm, normal heart sounds and intact distal pulses.  Exam reveals no gallop and no friction rub.   No murmur heard. Pulmonary/Chest: Effort normal and breath sounds normal. No respiratory distress. She has no wheezes. She has no rales.  Abdominal: Soft. She exhibits no distension. There is no tenderness. There is no guarding.  Musculoskeletal: She exhibits no edema or tenderness.  Neurological: She is alert and oriented to person, place, and time. She has normal strength. She displays no tremor. No cranial nerve deficit or sensory deficit. Coordination normal. GCS eye subscore is 4. GCS verbal subscore is 5. GCS motor subscore is 6.  Skin: Skin is warm and dry. No rash noted. She is not diaphoretic. No erythema.  Nursing note and vitals reviewed.   ED Course  Procedures (including critical care time) Labs Review Labs Reviewed  BASIC METABOLIC PANEL - Abnormal; Notable for the following:    Glucose, Bld 116 (*)    All other components within normal limits  CBC - Abnormal; Notable for the following:    RBC 3.72 (*)    All other components within normal limits  CBG MONITORING, ED - Abnormal; Notable for the following:    Glucose-Capillary 108 (*)    All other components within normal limits  URINALYSIS, ROUTINE W REFLEX MICROSCOPIC (NOT AT [facility])    Imaging Review No results found.    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  On arrival on EMS arrival patient was confused and combative. On arrival to the emergency department patient is alert and oriented and upset by the episode.   EKG evaluated by me and shows sinus rhythm with no sign of prolonged QTc, no brugada, no sign of HOCM, no ST abnormalities. No history of chest pain or shortness of breath and have low suspicion for ACS  or pulmonary embolus.  Patient reports lightheadedness prior to fall, and decreased po intake including not eating today and episode may represent dehydration, however pt with seizure-like activity after arriving to ED and feel seizure is likely cause of LOC.  Pt given ativan 2mg  and 1g of Keppra.  Patient denies possibility of alcohol or Xanax withdrawal and denies history of seizures.  CT head done shows no acute abnormality.  Basic labs show no acute abnormalities, normal poct glucose.  Discussed with Neurology, Dr. Gerilyn Pilgrimoonquah, and will admit to hospitalist for further care of 2 episodes of new seizures. MR brain ordered at time of admission shows signs of Chiari malformation.   CRITICAL CARE: Seizures Performed by: Rhae LernerSchlossman, Eureka Valdes Elizabeth   Total critical care time: 20min  Critical care time was exclusive of separately billable procedures and treating other patients.  Critical care was necessary to treat or prevent imminent or life-threatening deterioration.  Critical  care was time spent personally by me on the following activities: development of treatment plan with patient and/or surrogate as well as nursing, discussions with consultants, evaluation of patient's response to treatment, examination of patient, obtaining history from patient or surrogate, ordering and performing treatments and interventions, ordering and review of laboratory studies, ordering and review of radiographic studies, pulse oximetry and re-evaluation of patient's condition.   Alvira MondayErin Jaiyon Wander, MD 10/24/14 [phone]

## 2014-10-23 NOTE — Consult Note (Signed)
[place] A. Merlene Laughter, MD     [email or web address]          [name] is an 49 y.o. female.   ASSESSMENT/PLAN: 1. New-onset unprovoked seizures of unclear etiology. 2. Encephalopathy most multifactorial from medication effect and the post ictal state. 3. Arnold-Chiari malformation type I. This appears to be asymptomatic.  RECOMMENDATION: Agree with the loading dose of the Keppra. I will go ahead and start the patient on a low maintenance dose of 250 mg twice a day. EEG. Seizure precaution particularly no driving or operating machinery is discussed at length with the husband. There are a lot of concerns regarding this because of her job but this is for her safety and the safety of others.  The patient apparently had an episode of syncope earlier today. She was found by EMS to be confused and disoriented. She was taken to the emergency room where she had another spell in the emergency room witnessed by the staff there as a generalized tonoclonic seizure. The patient was loaded with Keppra and given Ativan. Patient is encephalopathic now and cannot provide any history. I did speak to her husband who reports that she told him today that she had another spell of syncope/blackout in the past. He also tells me that she has been having dizzy spells. This first spell today was preceded by dizziness. The husband tells me that there is no immediate family members who have seizures. There is no history of head injury, seizures in the past, encephalitis, meningitis, stroke, prematurity or developmental delay. The patient is on moderate doses of alprazolam but he tells me that she has been taking this and has not run out of the medication. No new medications are reported.  GENERAL: She is in no acute distress but drowsy.  HEENT: Supple. Atraumatic normocephalic.   ABDOMEN: soft  EXTREMITIES: No edema   BACK: Normal.  SKIN: Normal by inspection.    MENTAL STATUS: She is  sleeping in the bed and the awakenings with light sternal rub. She does follow commands but requires stimulation. She is oriented to hospital. Speech is normal.  CRANIAL NERVES: Pupils are equal, round and reactive to light and accommodation; extra ocular movements are full, there is no significant nystagmus; visual fields are full; upper and lower facial muscles are normal in strength and symmetric, there is no flattening of the nasolabial folds; tongue is midline; uvula is midline; shoulder elevation is normal.  MOTOR: Normal tone, bulk and strength; no pronator drift.  COORDINATION: Left finger to nose is normal, right finger to nose is normal, No rest tremor; no intention tremor; no postural tremor; no bradykinesia.  REFLEXES: Deep tendon reflexes are symmetrical and normal. Babinski reflexes are extensor bilaterally.   SENSATION: Normal to pain bilaterally.    The MRI is reviewed in person and shows no acute lesions on diffusion imaging. There is a low lying cerebellar tonsil noted. I do not appreciate to [facility] temporal sclerosis. No lesions are seen on T1 or FLAIR imaging.   Blood pressure 94/57, pulse 58, temperature 99.8 F (37.7 C), temperature source Oral, resp. rate 16, height 5' 7"  (1.702 m), weight 45.36 kg (100 lb), SpO2 95 %.  Past Medical History  Diagnosis Date  . Depression   . Anxiety   . COPD (chronic obstructive pulmonary disease)   . 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 Tubal ligation    . Partial hysterectomy    .  I&d extremity Left 07/17/2014    Procedure: IRRIGATION AND DEBRIDEMENT left index finger;  Surgeon: Leanora Cover, MD;  Location: [place];  Service: Orthopedics;  Laterality: Left;  . Nerve, tendon and artery repair Left 07/17/2014    Procedure: NERVE, TENDON AND ARTERY REPAIR LEFT INDEX FINGER;  Surgeon: Leanora Cover, MD;  Location: [place];  Service: Orthopedics;  Laterality: Left;    Family History    Problem Relation Age of Onset  . Hypertension Sister   . Depression Sister   . Drug abuse Sister   . Hypertension Mother   . Osteoporosis Mother   . Diabetes Mother   . Alcohol abuse Mother   . Depression Mother   . Heart disease Father     MI  . Depression Father   . Depression Sister   . Colon cancer Neg Hx     Social History:  reports that she has been smoking Cigarettes.  She has a 30 pack-year smoking history. She has never used smokeless tobacco. She reports that she drinks alcohol. She reports that she does not use illicit drugs.  Allergies:  Allergies  Allergen Reactions  . Effexor [Venlafaxine Hcl] Other (See Comments)    Dizziness, pain behind eye  . Ampicillin Rash  . Neomycin Rash  . Penicillins Rash    Medications: Prior to Admission medications   Medication Sig Start Date End Date Taking? Authorizing Provider  ALPRAZolam (XANAX) 0.5 MG tablet TAKE (1) TABLET BY MOUTH THREE TIMES DAILY AS NEEDED FOR ANXIETY. MONTHLY REFILL. 07/21/14  Yes Nilda Simmer, NP  DULoxetine (CYMBALTA) 60 MG capsule TAKE 1 CAPSULE BY MOUTH EVERY DAY 07/21/14  Yes Nilda Simmer, NP  ENSURE (ENSURE) Take 237 mLs by mouth 2 (two) times daily.   Yes Historical Provider, MD  aspirin EC 325 MG tablet Take 1 tablet (325 mg total) by mouth daily. Patient not taking: Reported on 07/21/2014 07/18/14   Leanora Cover, MD  sulfamethoxazole-trimethoprim (BACTRIM DS) 800-160 MG per tablet Take 1 tablet by mouth 2 (two) times daily. Patient not taking: Reported on 10/23/2014 07/18/14   Leanora Cover, MD    Scheduled Meds: . enoxaparin (LOVENOX) injection  40 mg Subcutaneous Q24H  . sodium chloride  3 mL Intravenous Q12H   Continuous Infusions: . sodium chloride 75 mL/hr at 10/23/14 1835   PRN Meds:.LORazepam, ondansetron **OR** ondansetron (ZOFRAN) IV     Results for orders placed or performed during the hospital encounter of 10/23/14 (from the past 48 hour(s))  Basic metabolic panel      Status: Abnormal   Collection Time: 10/23/14 12:00 PM  Result Value Ref Range   Sodium 139 135 - 145 mmol/L   Potassium 4.4 3.5 - 5.1 mmol/L   Chloride 103 101 - 111 mmol/L   CO2 27 22 - 32 mmol/L   Glucose, Bld 116 (H) 65 - 99 mg/dL   BUN 13 6 - 20 mg/dL   Creatinine, Ser 0.95 0.44 - 1.00 mg/dL   Calcium 9.2 8.9 - 10.3 mg/dL   GFR calc non Af Amer >60 >60 mL/min   GFR calc Af Amer >60 >60 mL/min    Comment: (NOTE) The eGFR has been calculated using the CKD EPI equation. This calculation has not been validated in all clinical situations. eGFR's persistently <60 mL/min signify possible Chronic Kidney Disease.    Anion gap 9 5 - 15  CBC     Status: Abnormal   Collection Time: 10/23/14 12:00 PM  Result Value Ref Range  WBC 7.5 4.0 - 10.5 K/uL   RBC 3.72 (L) 3.87 - 5.11 MIL/uL   Hemoglobin 12.2 12.0 - 15.0 g/dL   HCT 36.7 36.0 - 46.0 %   MCV 98.7 78.0 - 100.0 fL   MCH 32.8 26.0 - 34.0 pg   MCHC 33.2 30.0 - 36.0 g/dL   RDW 12.7 11.5 - 15.5 %   Platelets 202 150 - 400 K/uL  CBG monitoring, ED     Status: Abnormal   Collection Time: 10/23/14 12:00 PM  Result Value Ref Range   Glucose-Capillary 108 (H) 65 - 99 mg/dL  TSH     Status: None   Collection Time: 10/23/14 12:00 PM  Result Value Ref Range   TSH 1.391 0.350 - 4.500 uIU/mL    Studies/Results:  IMPRESSION: 1. Chiari 1 malformation. The cerebellar tonsils extend 7 mm below the foramen magnum in the midline. 2. Otherwise normal MRI of the brain.    Manan Olmo A. Merlene Laughter, M.D.  Diplomate, Tax adviser of Psychiatry and Neurology ( Neurology). 10/23/2014, 6:39 PM

## 2014-10-23 NOTE — ED Notes (Signed)
Report given to Taylor, RN unit 300. Ready to receive patient. 

## 2014-10-23 NOTE — ED Notes (Signed)
Patient to MRI and then will be transported to room 321 for admission

## 2014-10-23 NOTE — H&P (Addendum)
[facility] History and Physical  Susan Davenportaige H Thalman ZOX:[id number] DOB: [birth year] DOA: 10/23/2014  Referring physician: ER PCP: Lilyan PuntScott Luking, MD   Chief Complaint: Seizure  HPI: [name] is a 49 y.o. female  This is a 49 year old lady who presents with syncopal episode which initially was thought to be seizure and then actual seizure episode in the emergency room. 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 She felt lightheaded and dizzy prior to the fall apparently. When she arrived in the emergency room, she was seen to have a tonic-clonic seizure. The patient is now drowsy and likely postictal. She cannot give me any history. Her husband is at bedside to is not present when these episodes occurred and therefore cannot also give me any clear history. The husband says that she does have a history of anxiety and takes an Xanax and the patient has been taking Xanax as scheduled. She has not missed any doses recently. She is now being admitted for further investigation.   Review of Systems:  Unable to obtain review of systems from the patient because of altered mental status/postictal state.  Past Medical History  Diagnosis Date  . Depression   . Anxiety   . COPD (chronic obstructive pulmonary disease)   . 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 Tubal ligation    . Partial hysterectomy    . I&d extremity Left 07/17/2014    Procedure: IRRIGATION AND DEBRIDEMENT left index finger;  Surgeon: Betha LoaKevin Kuzma, MD;  [facility];  Service: Orthopedics;  Laterality: Left;  . Nerve, tendon and artery repair Left 07/17/2014    Procedure: NERVE, TENDON AND ARTERY REPAIR LEFT INDEX FINGER;  Surgeon: Betha LoaKevin Kuzma, MD;  Location: [facility] OR;  Service: Orthopedics;  Laterality: Left;   Social History:  reports that she has been smoking Cigarettes.  She has a 30 pack-year smoking history. She has never used smokeless tobacco. She  reports that she drinks alcohol. She reports that she does not use illicit drugs.  Allergies  Allergen Reactions  . Effexor [Venlafaxine Hcl] Other (See Comments)    Dizziness, pain behind eye  . Ampicillin Rash  . Neomycin Rash  . Penicillins Rash    Family History  Problem Relation Age of Onset  . Hypertension Sister   . Depression Sister   . Drug abuse Sister   . Hypertension Mother   . Osteoporosis Mother   . Diabetes Mother   . Alcohol abuse Mother   . Depression Mother   . Heart disease Father     MI  . Depression Father   . Depression Sister   . Colon cancer Neg Hx     Prior to Admission medications   Medication Sig Start Date End Date Taking? Authorizing Provider  ALPRAZolam (XANAX) 0.5 MG tablet TAKE (1) TABLET BY MOUTH THREE TIMES DAILY AS NEEDED FOR ANXIETY. MONTHLY REFILL. 07/21/14  Yes Campbell Richesarolyn C Hoskins, NP  DULoxetine (CYMBALTA) 60 MG capsule TAKE 1 CAPSULE BY MOUTH EVERY DAY 07/21/14  Yes Campbell Richesarolyn C Hoskins, NP  ENSURE (ENSURE) Take 237 mLs by mouth 2 (two) times daily.   Yes Historical Provider, MD  aspirin EC 325 MG tablet Take 1 tablet (325 mg total) by mouth daily. Patient not taking: Reported on 07/21/2014 07/18/14   Betha LoaKevin Kuzma, MD  sulfamethoxazole-trimethoprim (BACTRIM DS) 800-160 MG per tablet Take 1 tablet by mouth 2 (two) times daily. Patient not taking: Reported on 10/23/2014  07/18/14   Betha Loa, MD   Physical Exam: Filed Vitals:   10/23/14 1300 10/23/14 1330 10/23/14 1430 10/23/14 1500  BP: 105/74 128/64 117/83 110/82  Pulse: 72 70 65 62  Temp:      TempSrc:      Resp:   14 18  SpO2: 100% 95% 100% 100%    Wt Readings from Last 3 Encounters:  07/21/14 45.36 kg (100 lb)  07/17/14 45.36 kg (100 lb)  01/08/14 44.906 kg (99 lb)    General:  Appears drowsy but able to respond to her voice. She does know that she is in the hospital in the emergency room. She then goes back to sleep. Eyes: PERRL, normal lids, irises & conjunctiva ENT: grossly  normal hearing, lips & tongue Neck: no LAD, masses or thyromegaly Cardiovascular: RRR, no m/r/g. No LE edema. Telemetry: SR, no arrhythmias  Respiratory: CTA bilaterally, no w/r/r. Normal respiratory effort. Abdomen: soft, ntnd Skin: no rash or induration seen on limited exam Musculoskeletal: grossly normal tone BUE/BLE Psychiatric: Not examined. Neurologic: grossly non-focal.          Labs on Admission:  Basic Metabolic Panel:  Recent Labs Lab 10/23/14 1200  NA 139  K 4.4  CL 103  CO2 27  GLUCOSE 116*  BUN 13  CREATININE 0.95  CALCIUM 9.2   Liver Function Tests: No results for input(s): AST, ALT, ALKPHOS, BILITOT, PROT, ALBUMIN in the last 168 hours. No results for input(s): LIPASE, AMYLASE in the last 168 hours. No results for input(s): AMMONIA in the last 168 hours. CBC:  Recent Labs Lab 10/23/14 1200  WBC 7.5  HGB 12.2  HCT 36.7  MCV 98.7  PLT 202   Cardiac Enzymes: No results for input(s): CKTOTAL, CKMB, CKMBINDEX, TROPONINI in the last 168 hours.  BNP (last 3 results) No results for input(s): BNP in the last 8760 hours.  ProBNP (last 3 results) No results for input(s): PROBNP in the last 8760 hours.  CBG:  Recent Labs Lab 10/23/14 1200  GLUCAP 108*    Radiological Exams on Admission: Ct Head Wo Contrast  10/23/2014   CLINICAL DATA:  Seizure earlier today.  Questionable fall  EXAM: CT HEAD WITHOUT CONTRAST  TECHNIQUE: Contiguous axial images were obtained from the base of the skull through the vertex without intravenous contrast.  COMPARISON:  Brain MRI January 30, 2012  FINDINGS: The ventricles are normal in size and configuration. There is no intracranial mass, hemorrhage, extra-axial fluid collection, or midline shift. The gray-white compartments are normal. There is no demonstrable acute infarct. The bony calvarium appears intact. The mastoid air cells are clear. There is a stable sebaceous cyst in the scalp of the right parietal region.   IMPRESSION: Right parietal regions sebaceous cyst. No intracranial mass, hemorrhage, or focal gray - white compartment lesion. No acute infarct apparent.   Electronically Signed   By: Bretta Bang III M.D.   On: 10/23/2014 13:45      Assessment/Plan   1. Seizure. There does not appear to be any clear etiology. MRI brain scan will be obtained in view of the possibility of a new onset seizure. Since she was reportedly taking her Xanax as usual, this is unlikely a benzodiazepine withdrawal seizure. I will obtain a urine drug screen. Neurology consultation. EEG will be ordered. 2. COPD. Stable.  Further recommendations will depend on patient's hospital progress.  Code Status: Full code.   DVT Prophylaxis: Lovenox.  Family Communication: I discussed the plan with the patient's  husband at the bedside.   Disposition Plan: Home when medically stable.  Time spent: 60 minutes.  Wilson Singer [facility] Pager [phone].

## 2014-10-24 ENCOUNTER — Observation Stay (HOSPITAL_COMMUNITY)
Admit: 2014-10-24 | Discharge: 2014-10-24 | Disposition: A | Discharge status: Home / Self Care | Payer: BLUE CROSS/BLUE SHIELD | Attending: Neurology | Admitting: Neurology

## 2014-10-24 DIAGNOSIS — J449 Chronic obstructive pulmonary disease, unspecified: Secondary | ICD-10-CM | POA: Diagnosis not present

## 2014-10-24 DIAGNOSIS — R569 Unspecified convulsions: Secondary | ICD-10-CM

## 2014-10-24 DIAGNOSIS — F419 Anxiety disorder, unspecified: Secondary | ICD-10-CM

## 2014-10-24 LAB — COMPREHENSIVE METABOLIC PANEL
ALK PHOS: 35 U/L — AB (ref 38–126)
ALT: 19 U/L (ref 14–54)
ANION GAP: 6 (ref 5–15)
AST: 23 U/L (ref 15–41)
Albumin: 3.8 g/dL (ref 3.5–5.0)
BILIRUBIN TOTAL: 1 mg/dL (ref 0.3–1.2)
BUN: 14 mg/dL (ref 6–20)
CO2: 26 mmol/L (ref 22–32)
CREATININE: 0.83 mg/dL (ref 0.44–1.00)
Calcium: 8.7 mg/dL — ABNORMAL LOW (ref 8.9–10.3)
Chloride: 106 mmol/L (ref 101–111)
GFR calc non Af Amer: >60 mL/min (ref >60)
GLUCOSE: 89 mg/dL (ref 65–99)
POTASSIUM: 3.7 mmol/L (ref 3.5–5.1)
SODIUM: 138 mmol/L (ref 135–145)
TOTAL PROTEIN: 5.8 g/dL — AB (ref 6.5–8.1)

## 2014-10-24 LAB — URINALYSIS, ROUTINE W REFLEX MICROSCOPIC
Glucose, UA: NEGATIVE mg/dL
Hgb urine dipstick: NEGATIVE
Ketones, ur: 40 mg/dL — AB
Leukocytes, UA: NEGATIVE
Nitrite: NEGATIVE
PROTEIN: NEGATIVE mg/dL
Urobilinogen, UA: 0.2 mg/dL (ref 0.0–1.0)
pH: 6 (ref 5.0–8.0)

## 2014-10-24 LAB — RAPID URINE DRUG SCREEN, HOSP PERFORMED
Amphetamines: NOT DETECTED
Barbiturates: NOT DETECTED
Benzodiazepines: POSITIVE — AB
COCAINE: NOT DETECTED
Opiates: NOT DETECTED
TETRAHYDROCANNABINOL: POSITIVE — AB

## 2014-10-24 LAB — CBC
HCT: 33.9 % — ABNORMAL LOW (ref 36.0–46.0)
Hemoglobin: 11.1 g/dL — ABNORMAL LOW (ref 12.0–15.0)
MCH: 32.4 pg (ref 26.0–34.0)
MCHC: 32.7 g/dL (ref 30.0–36.0)
MCV: 98.8 fL (ref 78.0–100.0)
Platelets: 189 10*3/uL (ref 150–400)
RBC: 3.43 MIL/uL — ABNORMAL LOW (ref 3.87–5.11)
RDW: 12.9 % (ref 11.5–15.5)
WBC: 11.7 10*3/uL — ABNORMAL HIGH (ref 4.0–10.5)

## 2014-10-24 MED ORDER — LEVETIRACETAM 250 MG PO TABS: 250.0000 mg | ORAL_TABLET | Freq: Two times a day (BID) | ORAL | Status: DC

## 2014-10-24 NOTE — Care Management Note (Signed)
Case Management Note  Patient Details  Name: [name] H Gutterman MRN: [id number] Date of Birth: 12/08/1965  Subjective/Objective:                  Pt admitted from home with seizures. Pt lives with her husband and will return home at discharge. Pt is independent with ADL's.  Action/Plan: No Cm needs noted.  Expected Discharge Date:   10/25/14               Expected Discharge Plan:  Home/Self Care  In-House Referral:  NA  Discharge planning Services  CM Consult  Post Acute Care Choice:  NA Choice offered to:  NA  DME Arranged:    DME Agency:     HH Arranged:    HH Agency:     Status of Service:  Completed, signed off  Medicare Important Message Given:    Date Medicare IM Given:    Medicare IM give by:    Date Additional Medicare IM Given:    Additional Medicare Important Message give by:     If discussed at Long Length of Stay Meetings, dates discussed:    Additional Comments:  Cheryl FlashBlackwell, Karimah Winquist Crowder, RN 10/24/2014, 10:54 AM

## 2014-10-24 NOTE — Discharge Planning (Signed)
Pt is being discharged home with husband, vitals stable, no c/o pain, belongings sent with patient, all discharge paperwork sent with patient no questions or concerns

## 2014-10-24 NOTE — Discharge Summary (Signed)
Physician Discharge Summary  [name] ZOX:[id number] DOB: 01-27-1966 DOA: 10/23/2014  PCP: Lilyan PuntScott Luking, MD  Admit date: 10/23/2014 Discharge date: 10/24/2014  Time spent: 40 minutes  Recommendations for Outpatient Follow-up:  1. Patient will follow-up with neurology in the next 2 weeks for EEG results. She was advised that she cannot drive/operate heavy machinery until cleared by neurology.  Discharge Diagnoses:  Active Problems:   Anxiety   COPD (chronic obstructive pulmonary disease)   Seizure   Discharge Condition: Improved  Diet recommendation: Regular diet  Filed Weights   10/23/14 1757  Weight: 45.36 kg (100 lb)    History of present illness:  This patient was admitted to the hospital after syncopal episode. She was brought to the emergency room for evaluation where she had a witnessed tonic-clonic seizure. She was admitted for further treatments.  Hospital Course:  Patient was monitored in the hospital did not have any further seizures. 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 EEG was done with report currently pending. She was loaded with intravenous Keppra. She was seen by neurology who recommended continuing oral Keppra as an outpatient. Patient is otherwise stable at this time. She does not have any evidence of infection and is very insistent on being discharged home. She chronically takes Xanax for anxiety and reports that she did not miss any of this medication. Urine drug screen was positive for benzodiazepine and cannabis. Patient is otherwise stable for discharge and will follow-up with neurology for results of EEG. This was discussed with neurology who agrees with plan.  Procedures:  EEG, report pending  Consultations:  Neurology  Discharge Exam: Filed Vitals:   10/24/14 0525  BP: 84/56  Pulse: 56  Temp: 99.4 F (37.4 C)  Resp: 16    General:  NAD Cardiovascular: s1 s2 rrr Respiratory: CTA B  Discharge Instructions   Discharge Instructions    Diet - low sodium heart healthy    Complete by:  As directed      Increase activity slowly    Complete by:  As directed           Discharge Medication List as of 10/24/2014  3:23 PM    START taking these medications   Details  levETIRAcetam (KEPPRA) 250 MG tablet Take 1 tablet (250 mg total) by mouth 2 (two) times daily., Starting 10/24/2014, Until Discontinued, Print      CONTINUE these medications which have NOT CHANGED   Details  ALPRAZolam (XANAX) 0.5 MG tablet TAKE (1) TABLET BY MOUTH THREE TIMES DAILY AS NEEDED FOR ANXIETY. MONTHLY REFILL., Print    DULoxetine (CYMBALTA) 60 MG capsule TAKE 1 CAPSULE BY MOUTH EVERY DAY, Normal    ENSURE (ENSURE) Take 237 mLs by mouth 2 (two) times daily., Until Discontinued, Historical Med      STOP taking these medications     aspirin EC 325 MG tablet      sulfamethoxazole-trimethoprim (BACTRIM DS) 800-160 MG per tablet        Allergies  Allergen Reactions  . Effexor [Venlafaxine Hcl] Other (See Comments)    Dizziness, pain behind eye  . Ampicillin Rash  . Neomycin Rash  . Penicillins Rash   Follow-up Information    [facility] Schedule an appointment as soon as possible for a visit in 2 weeks.   Specialty:  Neurology   Why:  follow up EEG results  Contact information:   [address] [place] [address] [phone]        The results of significant diagnostics from this hospitalization (including imaging, microbiology, ancillary and laboratory) are listed below for reference.    Significant Diagnostic Studies: Ct Head Wo Contrast  10/23/2014   CLINICAL DATA:  Seizure earlier today.  Questionable fall  EXAM: CT HEAD WITHOUT CONTRAST  TECHNIQUE: Contiguous axial images were obtained from the base of the skull through the vertex without intravenous contrast.  COMPARISON:  Brain MRI  January 30, 2012  FINDINGS: The ventricles are normal in size and configuration. There is no intracranial mass, hemorrhage, extra-axial fluid collection, or midline shift. The gray-white compartments are normal. There is no demonstrable acute infarct. The bony calvarium appears intact. The mastoid air cells are clear. There is a stable sebaceous cyst in the scalp of the right parietal region.  IMPRESSION: Right parietal regions sebaceous cyst. No intracranial mass, hemorrhage, or focal gray - white compartment lesion. No acute infarct apparent.   Electronically Signed   By: Bretta Bang III M.D.   On: 10/23/2014 13:45   Mr Brain Wo Contrast  10/23/2014   CLINICAL DATA:  New onset seizures.  EXAM: MRI HEAD WITHOUT CONTRAST  TECHNIQUE: Multiplanar, multiecho pulse sequences of the brain and surrounding structures were obtained without intravenous contrast.  COMPARISON:  CT head without contrast from the same day.  FINDINGS: Study is mildly degraded by patient motion. The technologist notes this may be due to seizure activity.  The diffusion-weighted images demonstrate no evidence for acute or subacute infarction. The cerebellar tonsils extend 7 mm below the foramen magnum in the midline. No other midline lesions are evident.  No acute infarct, hemorrhage, or mass lesion is present. The ventricles are of normal size. No significant extraaxial fluid collection is present.  No significant white matter disease is present. Flow is present in the major intracranial arteries. The globes and orbits are intact. The paranasal sinuses and mastoid air cells are clear.  The skullbase is unremarkable.  IMPRESSION: 1. Chiari 1 malformation. The cerebellar tonsils extend 7 mm below the foramen magnum in the midline. 2. Otherwise normal MRI of the brain.   Electronically Signed   By: Marin Roberts M.D.   On: 10/23/2014 17:55    Microbiology: No results found for this or any previous visit (from the past 240  hour(s)).   Labs: Basic Metabolic Panel:  Recent Labs Lab 10/23/14 1200 10/24/14 0606  NA 139 138  K 4.4 3.7  CL 103 106  CO2 27 26  GLUCOSE 116* 89  BUN 13 14  CREATININE 0.95 0.83  CALCIUM 9.2 8.7*   Liver Function Tests:  Recent Labs Lab 10/24/14 0606  AST 23  ALT 19  ALKPHOS 35*  BILITOT 1.0  PROT 5.8*  ALBUMIN 3.8   No results for input(s): LIPASE, AMYLASE in the last 168 hours. No results for input(s): AMMONIA in the last 168 hours. CBC:  Recent Labs Lab 10/23/14 1200 10/24/14 0606  WBC 7.5 11.7*  HGB 12.2 11.1*  HCT 36.7 33.9*  MCV 98.7 98.8  PLT 202 189   Cardiac Enzymes: No results for input(s): CKTOTAL, CKMB, CKMBINDEX, TROPONINI in the last 168 hours. BNP: BNP (last 3 results) No results for input(s): BNP in the last 8760 hours.  ProBNP (last 3 results) No results for input(s): PROBNP in the last 8760 hours.  CBG:  Recent Labs Lab 10/23/14 1200  GLUCAP 108*  Signed:  MEMON,JEHANZEB  [facility] 10/24/2014, 6:56 PM

## 2014-10-24 NOTE — Progress Notes (Signed)
EEG Completed; Results Pending  

## 2014-10-24 NOTE — Progress Notes (Signed)
Notified MD of patient's low blood pressure.  Patient is alert and oriented x 4 and mentation has been at baseline since 0030.  Patient is able to walk to the bathroom and answers questions appropriately and is not symptomatic at this time.  Will increase fluid, per md order and will continue to monitor patient.

## 2014-10-24 NOTE — Procedures (Signed)
  [facility] Kofi A. Gerilyn Pilgrimoonquah, MD     [email or web address]           HISTORY: The patient is a 49 year old female who presents with recurrent seizures.  MEDICATIONS: Scheduled Meds: Continuous Infusions: PRN Meds:.  Prior to Admission medications   Medication Sig Start Date End Date Taking? Authorizing Tabbetha Kutscher  ALPRAZolam (XANAX) 0.5 MG tablet TAKE (1) TABLET BY MOUTH THREE TIMES DAILY AS NEEDED FOR ANXIETY. MONTHLY REFILL. 07/21/14   Campbell Richesarolyn C Hoskins, NP  DULoxetine (CYMBALTA) 60 MG capsule TAKE 1 CAPSULE BY MOUTH EVERY DAY 07/21/14   Campbell Richesarolyn C Hoskins, NP  ENSURE (ENSURE) Take 237 mLs by mouth 2 (two) times daily.    Historical Robel Wuertz, MD  levETIRAcetam (KEPPRA) 250 MG tablet Take 1 tablet (250 mg total) by mouth 2 (two) times daily. 10/24/14   Erick BlinksJehanzeb Memon, MD      ANALYSIS: A 16 channel recording using standard 10 20 measurements is conducted for 20 minutes. There is a well-formed posterior dominant rhythm of 10 Hz which attenuates with eye opening. There is beta activity observed in the frontal areas. Awake and drowsy activities are observed. Photic stimulation is carried out without abnormal changes in the background activity. There is no focal or lateral slowing. There is no epileptiform activity is observed.   IMPRESSION: 1. This a normal recording of the awake and drowsy states.      Kofi A. Gerilyn Pilgrimoonquah, M.D.  Diplomate, Biomedical engineerAmerican Board of Psychiatry and Neurology ( Neurology).

## 2014-11-06 ENCOUNTER — Ambulatory Visit (INDEPENDENT_AMBULATORY_CARE_PROVIDER_SITE_OTHER): Payer: BLUE CROSS/BLUE SHIELD | Admitting: Nurse Practitioner

## 2014-11-06 ENCOUNTER — Encounter: Payer: Self-pay | Admitting: Nurse Practitioner

## 2014-11-06 VITALS — BP 100/68 | Wt 102.0 lb

## 2014-11-06 DIAGNOSIS — F419 Anxiety disorder, unspecified: Secondary | ICD-10-CM

## 2014-11-06 DIAGNOSIS — R569 Unspecified convulsions: Secondary | ICD-10-CM | POA: Diagnosis not present

## 2014-11-06 DIAGNOSIS — G935 Compression of brain: Secondary | ICD-10-CM | POA: Diagnosis not present

## 2014-11-06 DIAGNOSIS — F329 Major depressive disorder, single episode, unspecified: Secondary | ICD-10-CM

## 2014-11-06 DIAGNOSIS — F32A Depression, unspecified: Secondary | ICD-10-CM

## 2014-11-06 DIAGNOSIS — G4700 Insomnia, unspecified: Secondary | ICD-10-CM | POA: Diagnosis not present

## 2014-11-06 MED ORDER — TRAZODONE HCL 50 MG PO TABS: ORAL_TABLET | ORAL | Status: DC
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
## 2014-11-06 NOTE — Patient Instructions (Signed)
Chiari Malformation Chiari malformation (CM) causes brain tissue to settle into the spinal canal. There are four types of CM.  Type 1 is most common. It can go unnoticed until problems start, usually with headaches in young adults.  Type 2 is present at birth and always involves a form of spina bifida. Part of the spinal cord pushes through the spine and is exposed. Spina bifida usually causes paralysis of the legs.  Type 3 is more severe because it involves more brain tissue.  Type 4 is the most severe because the brain does not develop correctly. Adults and adolescents who are unaware they have Type 1 CM may have headaches that are located in the back of the head and get worse with coughing or straining. If more brain tissue is involved, problems may include dizziness, trouble with balance, and vision issues. Diagnosis is made by an MRI. TREATMENT  Babies may need surgery to repair spina bifida. Medications may be used to control pain. Some adults with CM may benefit from surgery for which the goal is to keep the malformation from getting worse. Document Released: 03/18/2002 Document Revised: 08/12/2013 Document Reviewed: 03/25/2008 Exit[employer] Patient Information 2015 [employer]. This information is not intended to replace advice given to you by your health care provider. Make sure you discuss any questions you have with your health care provider.  

## 2014-11-09 NOTE — Progress Notes (Signed)
REVIEWED-NO ADDITIONAL RECOMMENDATIONS. 

## 2014-11-11 ENCOUNTER — Encounter: Payer: Self-pay | Admitting: Nurse Practitioner

## 2014-11-11 ENCOUNTER — Ambulatory Visit (INDEPENDENT_AMBULATORY_CARE_PROVIDER_SITE_OTHER): Payer: BLUE CROSS/BLUE SHIELD | Admitting: Nurse Practitioner

## 2014-11-11 ENCOUNTER — Ambulatory Visit: Payer: BLUE CROSS/BLUE SHIELD | Admitting: Family Medicine

## 2014-11-11 ENCOUNTER — Other Ambulatory Visit: Payer: Self-pay | Admitting: Nurse Practitioner

## 2014-11-11 VITALS — BP 110/80 | Temp 98.6°F | Ht 67.0 in | Wt 95.4 lb

## 2014-11-11 DIAGNOSIS — B0230 Zoster ocular disease, unspecified: Secondary | ICD-10-CM | POA: Diagnosis not present

## 2014-11-11 DIAGNOSIS — B029 Zoster without complications: Secondary | ICD-10-CM | POA: Diagnosis not present

## 2014-11-11 DIAGNOSIS — B86 Scabies: Secondary | ICD-10-CM

## 2014-11-11 MED ORDER — HYDROCODONE-ACETAMINOPHEN 5-325 MG PO TABS: 1.0000 | ORAL_TABLET | ORAL | Status: DC | PRN

## 2014-11-11 MED ORDER — TRIAMCINOLONE ACETONIDE 0.1 % EX CREA: 1.0000 "application " | TOPICAL_CREAM | Freq: Two times a day (BID) | CUTANEOUS | Status: DC

## 2014-11-11 MED ORDER — VALACYCLOVIR HCL 1 G PO TABS: 1000.0000 mg | ORAL_TABLET | Freq: Three times a day (TID) | ORAL | Status: DC

## 2014-11-11 MED ORDER — PERMETHRIN 5 % EX CREA: 1.0000 "application " | TOPICAL_CREAM | Freq: Once | CUTANEOUS | Status: DC

## 2014-11-11 MED ORDER — PROMETHAZINE HCL 25 MG PO TABS: 25.0000 mg | ORAL_TABLET | ORAL | Status: DC | PRN

## 2014-11-11 NOTE — Telephone Encounter (Signed)
Called to clarify what pt wants. She told carolyn at office visit that she could not take pain med because it made her sick. Pt states she changed her mind and now does want some

## 2014-11-11 NOTE — Telephone Encounter (Signed)
rx ready for pickup. Pt notified 

## 2014-11-11 NOTE — Telephone Encounter (Signed)
Pt was seen today by Eber Jones, states she was supposed to get a pain med  Filled today while she was here but did not get one.   Can we fill this an call pt when its ready

## 2014-11-12 DIAGNOSIS — B029 Zoster without complications: Secondary | ICD-10-CM

## 2014-11-12 HISTORY — DX: Zoster without complications: B02.9

## 2014-11-14 ENCOUNTER — Ambulatory Visit (INDEPENDENT_AMBULATORY_CARE_PROVIDER_SITE_OTHER): Payer: BLUE CROSS/BLUE SHIELD | Admitting: Family Medicine

## 2014-11-14 ENCOUNTER — Encounter: Payer: Self-pay | Admitting: Nurse Practitioner

## 2014-11-14 VITALS — BP 110/78 | Temp 98.7°F | Ht 67.0 in | Wt 98.0 lb

## 2014-11-14 DIAGNOSIS — B029 Zoster without complications: Secondary | ICD-10-CM | POA: Diagnosis not present

## 2014-11-14 DIAGNOSIS — L03211 Cellulitis of face: Secondary | ICD-10-CM

## 2014-11-14 MED ORDER — DOXYCYCLINE HYCLATE 100 MG PO CAPS: 100.0000 mg | ORAL_CAPSULE | Freq: Two times a day (BID) | ORAL | Status: AC

## 2014-11-14 MED ORDER — HYDROCODONE-ACETAMINOPHEN 5-325 MG PO TABS: 1.0000 | ORAL_TABLET | ORAL | Status: DC | PRN

## 2014-11-14 NOTE — Progress Notes (Signed)
Subjective:  Presents for complaints of pain and pressure in the facial area on the left side with pressure and throbbing in the left eye. Rash in this area for the past 2 days. Has had nausea. Some vomiting today. Very anxious. No fever. Has not felt well. Also a rash on her legs for the past 2 weeks. Pruritic. States her sister has been treated for scabies. Noticed it after visiting her sister and working out in the yard.  Objective:   BP 110/80 mmHg  Temp(Src) 98.6 F (37 C) (Oral)  Ht 5\' 7"  (1.702 m)  Wt 95 lb 6 oz (43.262 kg)  BMI 14.93 kg/m2 NAD. Alert, oriented. TMs clear effusion, no erythema. Pharynx clear. Neck supple with mild adenopathy. Several clusters of slightly raised pink papular/vesicular lesions noted in the left temporal area going up into the scalp following a dermatomal line. Both conjunctiva are mildly injected. Minimal edema of the left upper eyelid. Area of rash tender to palpation. Positive left preauricular lymph node noted. Lungs clear. Heart regular rhythm. Multiple discrete resolving lesions with excoriation noted on the lower extremities bilaterally.  Assessment:  Problem List Items Addressed This Visit      Other   Herpes zoster - Primary   Relevant Medications   valACYclovir (VALTREX) 1000 MG tablet    Other Visit Diagnoses    Herpes zoster ophthalmicus        Relevant Medications    valACYclovir (VALTREX) 1000 MG tablet    Other Relevant Orders    Ambulatory referral to Ophthalmology    Scabies          Plan:  Meds ordered this encounter  Medications  . valACYclovir (VALTREX) 1000 MG tablet    Sig: Take 1 tablet (1,000 mg total) by mouth 3 (three) times daily.    Dispense:  21 tablet    Refill:  0    Order Specific Question:  Supervising Provider    Answer:  Merlyn Albert [[id number]]  . permethrin (ELIMITE) 5 % cream    Sig: Apply 1 application topically once. As directed; may repeat in one week if needed    Dispense:  60 g    Refill:  0   Order Specific Question:  Supervising Provider    Answer:  Merlyn Albert [[id number]]  . triamcinolone cream (KENALOG) 0.1 %    Sig: Apply 1 application topically 2 (two) times daily. Prn rash; use up to 2 weeks    Dispense:  30 g    Refill:  0    Order Specific Question:  Supervising Provider    Answer:  Merlyn Albert [[id number]]  . promethazine (PHENERGAN) 25 MG tablet    Sig: Take 1 tablet (25 mg total) by mouth every 4 (four) hours as needed for nausea or vomiting.    Dispense:  20 tablet    Refill:  0    Order Specific Question:  Supervising Provider    Answer:  Merlyn Albert [[id number]]  . DISCONTD: HYDROcodone-acetaminophen (NORCO/VICODIN) 5-325 MG per tablet    Sig: Take 1 tablet by mouth every 4 (four) hours as needed.    Dispense:  30 tablet    Refill:  0    Order Specific Question:  Supervising Provider    Answer:  Riccardo Dubin   Appointment made with local ophthalmologist for evaluation. Start valacyclovir as soon as possible. Given Phenergan for nausea. Call back in a.m. if vomiting persists. Also pain medication per patient request. Elimite  to cover possible scabies. Also triamcinolone for itching. Call back in 4-5 days if no improvement, sooner if worse. Warning signs reviewed regarding shingles.

## 2014-11-14 NOTE — Progress Notes (Signed)
   Subjective:    Patient ID: [name], female    DOB: [birth year], 49 y.o.   MRN: [id number]  HPI  Patient in for left eye swelling. Patient states that onset of symptoms began 3 days. Patient also states that she also has a headache.  She denies any blurred vision but states that she's been having some discomfort that she's had use of pain medicine she also relates she is taking the Valtrex she also states that her eye is watering some she did see eye doctor earlier this week who told her they did not see any corneal involvement Review of Systems See above    Objective:   Physical Exam  She has multiple lesions on the forehead and temporal area that are healing that is part of her shingles she also has some pre-orbital edema of the upper eyelid and lower eyelid that is not tender. She does have some redness around the healing lesions cannot rule out the possibility of a slight cellulitis associated with all of this      Assessment & Plan:  shingles-continue and finish Valtrex 3 times daily 1000 mg tablet  Shingles-related pain hydrocodone when necessary cautioned drowsiness if the pain is not significantly better over the next couple weeks follow-up for post shingles neuralgia  Redness and swelling of eye region-I do not find any evidence of cellulitis with her eye I believe this is secondary swelling from the associated infection of shingles. I doubt involvement of the cornea but she does have an appointment with eye specialist later today that they can evaluate her cornea  I did place the patient on doxycycline for the next 7 days to treat any possibility of associated cellulitis around the shingles lesions.  The patient was instructed to come back if worse

## 2014-11-25 ENCOUNTER — Encounter: Payer: Self-pay | Admitting: Diagnostic Neuroimaging

## 2014-11-25 ENCOUNTER — Ambulatory Visit (INDEPENDENT_AMBULATORY_CARE_PROVIDER_SITE_OTHER): Payer: BLUE CROSS/BLUE SHIELD | Admitting: Diagnostic Neuroimaging

## 2014-11-25 VITALS — BP 108/73 | HR 59 | Ht 67.0 in | Wt 98.4 lb

## 2014-11-25 DIAGNOSIS — G40909 Epilepsy, unspecified, not intractable, without status epilepticus: Secondary | ICD-10-CM

## 2014-11-25 MED ORDER — LEVETIRACETAM 250 MG PO TABS: 250.0000 mg | ORAL_TABLET | Freq: Two times a day (BID) | ORAL | Status: DC

## 2014-11-25 NOTE — Patient Instructions (Signed)
-   Continue levetiracetam  twice a day.  - According to [place] law, you can not drive unless you are seizure free for at least 6 months and under physician's care.   - Please maintain seizure precautions. Do not participate in activities where a loss of awareness could harm you or someone else. No swimming alone, no tub bathing, no hot tubs, no driving, no operating motorized vehicles (cars, ATVs, motocycles, etc), lawnmowers or power tools. No standing at heights, such as rooftops, ladders or stairs. Avoid hot objects such as stoves, heaters, open fires. Wear a helmet when riding a bicycle, scooter, skateboard, etc. and avoid areas of traffic. Set your water heater to 120 degrees or less.

## 2014-11-25 NOTE — Progress Notes (Signed)
[facility]  PATIENT: [name] DOB: 1965/05/26  REFERRING CLINICIAN: Hoskins HISTORY FROM: patient and husband REASON FOR VISIT: new consult    HISTORICAL  CHIEF COMPLAINT:  Chief Complaint  Patient presents with  . Seizures    rm 7, New patient, husband - Robert    HISTORY OF PRESENT ILLNESS:   49 year old right-handed female here for valuation of seizure. Patient has history of depression, anxiety, IBS, COPD. Also history of eating disorder, prior traumatic experiences and stresses.  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 These are normal for her over the past 1-2 years. However without warning patient lost consciousness. She was found by the resident of the home. 911 was called and paramedics arrived. Apparently patient was noted to have seizure activity. She was combative en route to the hospital. Patient had a second witnessed generalized seizure in the emergency room. She was imaged in the hospital for evaluation. She was started on antiseizure medication. MRI of the brain and EEG were unremarkable. Patient was discharged home and has been doing well.  Patient reports intermittent anxiety, panic attack, hot flash sensations throughout her life. These have subsided significantly since starting antiseizure medication.  Patient has family history of seizure in her nephew. Her sister has multiple sclerosis.   REVIEW OF SYSTEMS: Full 14 system review of systems performed and notable only for confusion headache numbness dizziness seizure passing out insomnia sleepiness depression anxiety change in appetite racing thoughts feeling hot feeling cold increased thirst flushing weight loss.   ALLERGIES: Allergies  Allergen Reactions  . Effexor [Venlafaxine Hcl] Other (See Comments)    Dizziness, pain behind eye  . Ampicillin Rash  . Neomycin Rash  . Penicillins Rash    HOME  MEDICATIONS: Outpatient Prescriptions Prior to Visit  Medication Sig Dispense Refill  . ALPRAZolam (XANAX) 0.5 MG tablet TAKE (1) TABLET BY MOUTH THREE TIMES DAILY AS NEEDED FOR ANXIETY. MONTHLY REFILL. 75 tablet 5  . DULoxetine (CYMBALTA) 60 MG capsule TAKE 1 CAPSULE BY MOUTH EVERY DAY 90 capsule 1  . ENSURE (ENSURE) Take 237 mLs by mouth 2 (two) times daily.    Marland Kitchen HYDROcodone-acetaminophen (NORCO/VICODIN) 5-325 MG per tablet Take 1 tablet by mouth every 4 (four) hours as needed. 30 tablet 0  . promethazine (PHENERGAN) 25 MG tablet Take 1 tablet (25 mg total) by mouth every 4 (four) hours as needed for nausea or vomiting. 20 tablet 0  . traZODone (DESYREL) 50 MG tablet One po qhs x 6 d then 2 po qhs 60 tablet 0  . levETIRAcetam (KEPPRA) 250 MG tablet Take 1 tablet (250 mg total) by mouth 2 (two) times daily. 60 tablet 1  . permethrin (ELIMITE) 5 % cream Apply 1 application topically once. As directed; may repeat in one week if needed (Patient not taking: Reported on 11/25/2014) 60 g 0  . triamcinolone cream (KENALOG) 0.1 % Apply 1 application topically 2 (two) times daily. Prn rash; use up to 2 weeks 30 g 0  . valACYclovir (VALTREX) 1000 MG tablet Take 1 tablet (1,000 mg total) by mouth 3 (three) times daily. 21 tablet 0   No facility-administered medications prior to visit.    PAST MEDICAL HISTORY: Past Medical History  Diagnosis Date  . Depression   . Anxiety   . COPD (chronic obstructive pulmonary disease)   . 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 IBS (irritable bowel syndrome)   . Seizures  10/23/14    x 2  . Shingles 11/12/14    left eye  . Chiari malformation     PAST SURGICAL HISTORY: Past Surgical History  Procedure Laterality Date  . Tubal ligation  1988  . Partial hysterectomy  2006  . I&d extremity Left 07/17/2014    Procedure: IRRIGATION AND DEBRIDEMENT left index finger;  Surgeon: Betha Loa, MD;  Location: [facility] OR;  Service: Orthopedics;  Laterality: Left;  . Nerve,  tendon and artery repair Left 07/17/2014    Procedure: NERVE, TENDON AND ARTERY REPAIR LEFT INDEX FINGER;  Surgeon: Betha Loa, MD;  Location: [facility] OR;  Service: Orthopedics;  Laterality: Left;    FAMILY HISTORY: Family History  Problem Relation Age of Onset  . Hypertension Sister   . Depression Sister   . Drug abuse Sister   . Hypertension Mother   . Osteoporosis Mother   . Diabetes Mother   . Alcohol abuse Mother   . Depression Mother   . Heart disease Father     MI  . Depression Father   . Depression Sister   . Multiple sclerosis Sister   . Colon cancer Neg Hx   . Cancer - Lung Maternal Aunt   . Leukemia Maternal Aunt     SOCIAL HISTORY:  Social History   Social History  . Marital Status: Married    Spouse Name: [name]  . Number of Children: 1  . Years of Education: 16   Occupational History  . home, yrad maintenance     self employed   Social History Main Topics  . Smoking status: Current Every Day Smoker -- 1.00 packs/day for 30 years    Types: Cigarettes  . Smokeless tobacco: Never Used  . Alcohol Use: Yes     Comment: occasionally, 11/25/14 once a week  . Drug Use: No  . Sexual Activity: Not Currently    Birth Control/ Protection: Surgical   Other Topics Concern  . Not on file   Social History Narrative   Lives at home with husband   Caffeine use- coffee, sodas all day     PHYSICAL EXAM  GENERAL EXAM/CONSTITUTIONAL: Vitals:  Filed Vitals:   11/25/14 0822  BP: 108/73  Pulse: 59  Height: 5\' 7"  (1.702 m)  Weight: 98 lb 6.4 oz (44.634 kg)     Body mass index is 15.41 kg/(m^2).  Visual Acuity Screening   Right eye Left eye Both eyes  Without correction: 20/20 20/20   With correction:        Patient is in no distress; well developed, nourished and groomed; neck is supple  CARDIOVASCULAR:  Examination of carotid arteries is normal; no carotid bruits  Regular rate and rhythm, no murmurs  Examination of peripheral vascular system by  observation and palpation is normal  EYES:  Ophthalmoscopic exam of optic discs and posterior segments is normal; no papilledema or hemorrhages  MUSCULOSKELETAL:  Gait, strength, tone, movements noted in Neurologic exam below  NEUROLOGIC: MENTAL STATUS:  No flowsheet data found.  awake, alert, oriented to person, place and time  recent and remote memory intact  normal attention and concentration  language fluent, comprehension intact, naming intact,   fund of knowledge appropriate  CRANIAL NERVE:   2nd - no papilledema on fundoscopic exam  2nd, 3rd, 4th, 6th - pupils equal and reactive to light, visual fields full to confrontation, extraocular muscles intact, no nystagmus  5th - facial sensation symmetric  7th - facial strength symmetric  8th - hearing  intact  9th - palate elevates symmetrically, uvula midline  11th - shoulder shrug symmetric  12th - tongue protrusion midline  MOTOR:   normal bulk and tone, full strength in the BUE, BLE  SENSORY:   normal and symmetric to light touch, pinprick, temperature, vibration   COORDINATION:   finger-nose-finger, fine finger movements normal  REFLEXES:   deep tendon reflexes present and symmetric  GAIT/STATION:   narrow based gait; able to walk tandem; romberg is negative    DIAGNOSTIC DATA (LABS, IMAGING, TESTING) - I reviewed patient records, labs, notes, testing and imaging myself where available.  Lab Results  Component Value Date   WBC 11.7* 10/24/2014   HGB 11.1* 10/24/2014   HCT 33.9* 10/24/2014   MCV 98.8 10/24/2014   PLT 189 10/24/2014      Component Value Date/Time   NA 138 10/24/2014 0606   K 3.7 10/24/2014 0606   CL 106 10/24/2014 0606   CO2 26 10/24/2014 0606   GLUCOSE 89 10/24/2014 0606   BUN 14 10/24/2014 0606   CREATININE 0.83 10/24/2014 0606   CREATININE 0.76 06/03/2013 1007   CALCIUM 8.7* 10/24/2014 0606   PROT 5.8* 10/24/2014 0606   ALBUMIN 3.8 10/24/2014 0606   AST 23  10/24/2014 0606   ALT 19 10/24/2014 0606   ALKPHOS 35* 10/24/2014 0606   BILITOT 1.0 10/24/2014 0606   GFRNONAA >60 10/24/2014 0606   GFRAA >60 10/24/2014 0606   Lab Results  Component Value Date   CHOL 191 12/25/2012   HDL 52 12/25/2012   LDLCALC 117* 12/25/2012   TRIG 109 12/25/2012   CHOLHDL 3.7 12/25/2012   No results found for: HGBA1C No results found for: ZOXWRUEA54 Lab Results  Component Value Date   TSH 1.391 10/23/2014   01/30/12 MRI brain [I reviewed images myself and agree with interpretation. -VRP]  - normal   10/23/14 MRI brain [I reviewed images myself. I think patient has mild cerebellar tonsillar ectopia ~65mm. This is borderline for chiari malformation. -VRP]  1. Chiari 1 malformation. The cerebellar tonsils extend 7 mm below the foramen magnum in the midline. 2. Otherwise normal MRI of the brain.  10/24/14 EEG - normal    ASSESSMENT AND PLAN  49 y.o. year old female here with new onset seizure x 2. Agree with continuing anti-seizure medication.   Dx: seizure disorder (? Partial onset with secondary generalization)  PLAN:  Patient Instructions  - Continue levetiracetam 250mg  twice a day.  - According to [place] law, you can not drive unless you are seizure free for at least 6 months and under physician's care.   - Please maintain seizure precautions. Do not participate in activities where a loss of awareness could harm you or someone else. No swimming alone, no tub bathing, no hot tubs, no driving, no operating motorized vehicles (cars, ATVs, motocycles, etc), lawnmowers or power tools. No standing at heights, such as rooftops, ladders or stairs. Avoid hot objects such as stoves, heaters, open fires. Wear a helmet when riding a bicycle, scooter, skateboard, etc. and avoid areas of traffic. Set your water heater to 120 degrees or less.    Meds ordered this encounter  Medications  . levETIRAcetam (KEPPRA) 250 MG tablet    Sig: Take 1 tablet (250 mg total) by  mouth 2 (two) times daily.    Dispense:  60 tablet    Refill:  12   Return in about 3 months (around 02/25/2015).  I reviewed images, labs, notes, hospital records myself. I  summarized findings and reviewed with patient, for this high risk condition (new onset seizure disorder) requiring high complexity decision making.    Suanne Marker, MD 11/25/2014, 9:18 AM Certified in Neurology, Neurophysiology and Neuroimaging  [facility] [address] [place], [place] [address] [phone]

## 2014-12-01 ENCOUNTER — Ambulatory Visit (INDEPENDENT_AMBULATORY_CARE_PROVIDER_SITE_OTHER): Payer: BLUE CROSS/BLUE SHIELD | Admitting: Psychology

## 2014-12-01 ENCOUNTER — Encounter (HOSPITAL_COMMUNITY): Payer: Self-pay | Admitting: Psychology

## 2014-12-01 DIAGNOSIS — F331 Major depressive disorder, recurrent, moderate: Secondary | ICD-10-CM | POA: Diagnosis not present
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
## 2014-12-08 ENCOUNTER — Other Ambulatory Visit: Payer: Self-pay | Admitting: Nurse Practitioner

## 2014-12-22 ENCOUNTER — Ambulatory Visit (INDEPENDENT_AMBULATORY_CARE_PROVIDER_SITE_OTHER): Payer: BLUE CROSS/BLUE SHIELD | Admitting: Psychology

## 2014-12-22 ENCOUNTER — Ambulatory Visit (INDEPENDENT_AMBULATORY_CARE_PROVIDER_SITE_OTHER): Payer: BLUE CROSS/BLUE SHIELD | Admitting: Obstetrics and Gynecology

## 2014-12-22 ENCOUNTER — Encounter: Payer: Self-pay | Admitting: Obstetrics and Gynecology

## 2014-12-22 VITALS — BP 90/60 | Ht 67.0 in | Wt 97.0 lb

## 2014-12-22 DIAGNOSIS — Z01419 Encounter for gynecological examination (general) (routine) without abnormal findings: Secondary | ICD-10-CM | POA: Diagnosis not present

## 2014-12-22 DIAGNOSIS — Z0000 Encounter for general adult medical examination without abnormal findings: Secondary | ICD-10-CM

## 2014-12-22 DIAGNOSIS — F331 Major depressive disorder, recurrent, moderate: Secondary | ICD-10-CM

## 2014-12-22 NOTE — Progress Notes (Signed)
Patient ID: [name], female   DOB: April 04, 1966, 49 y.o.   MRN: [id number] Pt here today for her annual exam. Pt states that she wants to discuss her mood swings and depression with Dr. Emelda Fear.

## 2014-12-22 NOTE — Progress Notes (Addendum)
Patient ID: [name], female   DOB: 1965-07-31, 49 y.o.   MRN: [id number]  Assessment:  Annual Gyn Exam   Plan:  1. Return annually or prn 2. Annual mammogram advised Assessment: 1. Vasomotor symptoms improved on Keppra 2. s/p hysterectomy, Pap smear not required 3. Pt to return in 3 years 4 Mammogram q yr 5. Colonoscopy at 50 6 Rectal / hemoccult cards by primary care if not seen here that yr.   Subjective:  [name] is a 49 y.o. female No obstetric history on file. who presents for annual exam. No LMP recorded. Patient has had a hysterectomy.  The patient has complaints today of intermittent hot flashes. Her last was a few weeks ago. Pt was started on Keppra after a seizure 2 months ago. She notes that hot flashes have subsided after starting on medication.   Pt also complains of chronic anxiety. She notes weight loss secondary to concerns about family and life. Pt is followed by Dr. Kieth Brightly for behavioral health.  The following portions of the patient's history were reviewed and updated as appropriate: allergies, current medications, past family history, past medical history, past social history, past surgical history and problem list. Past Medical History  Diagnosis Date  . Depression   . Anxiety   . COPD (chronic obstructive pulmonary disease)   . 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 IBS (irritable bowel syndrome)   . Seizures 10/23/14    x 2  . Shingles 11/12/14    left eye  . Chiari malformation     Past Surgical History  Procedure Laterality Date  . Tubal ligation  1988  . Partial hysterectomy  2006  . I&d extremity Left 07/17/2014    Procedure: IRRIGATION AND DEBRIDEMENT left index finger;  Surgeon: Betha Loa, MD;  Location: [facility] OR;  Service: Orthopedics;  Laterality: Left;  . Nerve, tendon and artery repair Left 07/17/2014    Procedure: NERVE, TENDON AND ARTERY REPAIR LEFT INDEX FINGER;  Surgeon: Betha Loa, MD;  Location: [facility] OR;  Service: Orthopedics;   Laterality: Left;     Current outpatient prescriptions:  .  ALPRAZolam (XANAX) 0.5 MG tablet, TAKE (1) TABLET BY MOUTH THREE TIMES DAILY AS NEEDED FOR ANXIETY. MONTHLY REFILL., Disp: 75 tablet, Rfl: 5 .  DULoxetine (CYMBALTA) 60 MG capsule, TAKE 1 CAPSULE BY MOUTH EVERY DAY, Disp: 90 capsule, Rfl: 1 .  levETIRAcetam (KEPPRA) 250 MG tablet, Take 1 tablet (250 mg total) by mouth 2 (two) times daily., Disp: 60 tablet, Rfl: 12 .  traZODone (DESYREL) 50 MG tablet, TAKE 1 TABLET BY MOUTH AT BEDTIME FOR 6 DAYS, THEN INCREASE TO 2 TABLETS AT BEDTIME., Disp: 60 tablet, Rfl: 4  Review of Systems Constitutional: negative Gastrointestinal: negative Genitourinary: negative  Objective:  BP 90/60 mmHg  Ht 5\' 7"  (1.702 m)  Wt 97 lb (43.999 kg)  BMI 15.19 kg/m2   BMI: Body mass index is 15.19 kg/(m^2).  General Appearance: Alert, appropriate appearance for age. No acute distress HEENT: Grossly normal Neck / Thyroid:  Cardiovascular: RRR; normal S1, S2, no murmur Lungs: CTA bilaterally Back: No CVAT Breast Exam: No dimpling, nipple retraction or discharge. No masses or nodes., Normal to inspection, Normal breast tissue bilaterally and No masses or nodes.No dimpling, nipple retraction or discharge. Gastrointestinal: Soft, non-tender, no masses or organomegaly Pelvic Exam: External genitalia: normal general appearance Vaginal: normal mucosa without prolapse or lesions and normal without tenderness, induration or masses Cervix: removed surgically Adnexa: removed surgically Uterus: removed  surgically Rectovaginal: normal rectal, no masses and guaiac negative stool obtained Lymphatic Exam: Non-palpable nodes in neck, clavicular, axillary, or inguinal regions  Skin: no rash or abnormalities Neurologic: Normal gait and speech, no tremor  Psychiatric: Alert and oriented, appropriate affect.  Urinalysis:Not done Hemoccult: Negative  Christin Bach. MD Pgr [phone] 2:08 PM    This chart was  scribed for Tilda Burrow, MD by Gwenyth Ober, Medical Scribe. This patient was seen in room 1 and the patient's care was started at 2:08 PM.    I personally performed the services described in this documentation, which was SCRIBED in my presence. The recorded information has been reviewed and considered accurate. It has been edited as necessary during review. Tilda Burrow, MD

## 2014-12-23 ENCOUNTER — Ambulatory Visit (INDEPENDENT_AMBULATORY_CARE_PROVIDER_SITE_OTHER): Payer: BLUE CROSS/BLUE SHIELD | Admitting: Family Medicine

## 2014-12-23 ENCOUNTER — Encounter: Payer: Self-pay | Admitting: Family Medicine

## 2014-12-23 VITALS — BP 110/76 | Ht 67.0 in | Wt 97.4 lb

## 2014-12-23 DIAGNOSIS — H9201 Otalgia, right ear: Secondary | ICD-10-CM | POA: Diagnosis not present

## 2014-12-23 DIAGNOSIS — R6884 Jaw pain: Secondary | ICD-10-CM | POA: Diagnosis not present

## 2014-12-23 MED ORDER — ETODOLAC 300 MG PO CAPS: 300.0000 mg | ORAL_CAPSULE | Freq: Two times a day (BID) | ORAL | Status: DC

## 2014-12-23 NOTE — Progress Notes (Signed)
   Subjective:    Patient ID: [name], female    DOB: 29-Mar-1966, 49 y.o.   MRN: [id number]  Otalgia  There is pain in the right ear. This is a new problem. The current episode started yesterday. The problem occurs every few hours (During swallowing.). The problem has been gradually worsening. There has been no fever. The pain is at a severity of 4/10. The pain is moderate. She has tried nothing for the symptoms.    Patient states that ear pain is noted only when swallowing. Patient states that when she swallows food or drinks she feels pain in her face. She denies any head congestion drainage coughing. Patient would also like to discuss scalp condition from when she previously had shingles. States the shingles has healed up well. Review of Systems  HENT: Positive for ear pain.    Hurts to open up the mouth hurts in the jaw region.    Objective:   Physical Exam Significant inflammation in the jaw area. No redness no swelling more crepitus and pain and discomfort in that area eardrum appears normal neck no masses lungs are clear       Assessment & Plan:  Right ear pain/jaw pain-I believe that this is inflammation more than likely in the TMJ but I cannot know for certain I believe anti-inflammatory 7 the next 1-2 weeks should help avoid any excessive chewing. Should gradually get better.

## 2014-12-26 ENCOUNTER — Telehealth: Payer: Self-pay | Admitting: Family Medicine

## 2014-12-26 ENCOUNTER — Other Ambulatory Visit: Payer: Self-pay | Admitting: *Deleted

## 2014-12-26 MED ORDER — LEVETIRACETAM 250 MG PO TABS: 250.0000 mg | ORAL_TABLET | Freq: Two times a day (BID) | ORAL | Status: DC

## 2014-12-26 NOTE — Telephone Encounter (Signed)
Pt went out of town an forgot her seizure meds, wants to know if you can  Call in about 6 pills for her through till she gets back home?   [facility] [address] [place] [phone] phone number

## 2014-12-26 NOTE — Telephone Encounter (Signed)
Ok lets 

## 2014-12-26 NOTE — Telephone Encounter (Signed)
Med sent to pharm. Pt notified.  

## 2014-12-26 NOTE — Telephone Encounter (Signed)
keppra  one bid prescribed by dr Marjory Lies.

## 2015-01-01 ENCOUNTER — Other Ambulatory Visit: Payer: Self-pay | Admitting: Nurse Practitioner

## 2015-01-08 ENCOUNTER — Ambulatory Visit (HOSPITAL_COMMUNITY): Payer: BLUE CROSS/BLUE SHIELD | Admitting: Psychology

## 2015-01-19 ENCOUNTER — Ambulatory Visit: Payer: BLUE CROSS/BLUE SHIELD | Admitting: Nurse Practitioner

## 2015-01-20 ENCOUNTER — Ambulatory Visit: Payer: BLUE CROSS/BLUE SHIELD | Admitting: Nurse Practitioner

## 2015-01-30 ENCOUNTER — Ambulatory Visit (INDEPENDENT_AMBULATORY_CARE_PROVIDER_SITE_OTHER): Payer: BLUE CROSS/BLUE SHIELD | Admitting: Psychology

## 2015-01-30 DIAGNOSIS — F331 Major depressive disorder, recurrent, moderate: Secondary | ICD-10-CM | POA: Diagnosis not present

## 2015-02-03 ENCOUNTER — Other Ambulatory Visit: Payer: Self-pay

## 2015-02-03 MED ORDER — LEVETIRACETAM 250 MG PO TABS: 250.0000 mg | ORAL_TABLET | Freq: Two times a day (BID) | ORAL | Status: DC

## 2015-02-09 ENCOUNTER — Other Ambulatory Visit: Payer: Self-pay | Admitting: *Deleted

## 2015-02-09 MED ORDER — TRAZODONE HCL 50 MG PO TABS: ORAL_TABLET | ORAL | Status: DC

## 2015-02-12 ENCOUNTER — Ambulatory Visit (INDEPENDENT_AMBULATORY_CARE_PROVIDER_SITE_OTHER): Payer: BLUE CROSS/BLUE SHIELD | Admitting: Psychiatry

## 2015-02-12 ENCOUNTER — Encounter (HOSPITAL_COMMUNITY): Payer: Self-pay | Admitting: Psychiatry

## 2015-02-12 VITALS — BP 110/70 | HR 72 | Ht 67.0 in | Wt 96.4 lb

## 2015-02-12 DIAGNOSIS — F411 Generalized anxiety disorder: Secondary | ICD-10-CM

## 2015-02-12 DIAGNOSIS — F331 Major depressive disorder, recurrent, moderate: Secondary | ICD-10-CM

## 2015-02-12 MED ORDER — DULOXETINE HCL 30 MG PO CPEP: 30.0000 mg | ORAL_CAPSULE | Freq: Every day | ORAL | Status: DC
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
## 2015-02-16 ENCOUNTER — Telehealth (HOSPITAL_COMMUNITY): Payer: Self-pay | Admitting: *Deleted

## 2015-02-16 NOTE — Telephone Encounter (Signed)
voice message regarding Cymbalta 30 mg. received.    they recently filled 60 mg.   please call to verify if dosage changed.    Ref. # [id number].

## 2015-02-17 NOTE — Telephone Encounter (Signed)
Called pt pharmacy ([facility]) and spoke with Madelena. Per Madelaine BhatMadelena, they would like to know if pt is suppose to take the 30 mg or the 60 mg of Cymbalta due to them receiving both. Informed Madelena that pt is suppose to be taking a total of 90 mg tablets. Asked Madelena if she knew how long is it going to take before they send out pt medication to pt due to this being a new script and provider wanting pt to start this medication soon. Per Madelaine BhatMadelena, she will put a rush on the order to see if they can mail it out to pt today. Office showed understanding.

## 2015-02-23 ENCOUNTER — Ambulatory Visit (HOSPITAL_COMMUNITY): Payer: Self-pay | Admitting: Psychiatry

## 2015-02-25 ENCOUNTER — Ambulatory Visit (INDEPENDENT_AMBULATORY_CARE_PROVIDER_SITE_OTHER): Payer: BLUE CROSS/BLUE SHIELD | Admitting: Psychology

## 2015-02-25 ENCOUNTER — Ambulatory Visit (INDEPENDENT_AMBULATORY_CARE_PROVIDER_SITE_OTHER): Payer: BLUE CROSS/BLUE SHIELD | Admitting: Diagnostic Neuroimaging

## 2015-02-25 ENCOUNTER — Encounter: Payer: Self-pay | Admitting: Diagnostic Neuroimaging

## 2015-02-25 VITALS — BP 95/61 | HR 74 | Ht 67.0 in | Wt 101.4 lb

## 2015-02-25 DIAGNOSIS — F331 Major depressive disorder, recurrent, moderate: Secondary | ICD-10-CM

## 2015-02-25 DIAGNOSIS — G40909 Epilepsy, unspecified, not intractable, without status epilepticus: Secondary | ICD-10-CM | POA: Diagnosis not present

## 2015-02-25 MED ORDER — LEVETIRACETAM 500 MG PO TABS: 500.0000 mg | ORAL_TABLET | Freq: Two times a day (BID) | ORAL | Status: DC

## 2015-02-25 NOTE — Patient Instructions (Signed)
Thank you for coming to see [place] at [facility]. I hope we have been able to provide you high quality care today.  You may receive a patient satisfaction survey over the next few weeks. We would appreciate your feedback and comments so that we may continue to improve ourselves and the health of our patients.  - increase levetiracetam to 538m twice a day  - According to [place] law, you can not drive unless you are seizure free for at least 6 months and under physician's care. Last seizure was 10/23/14.   ~~~~~~~~~~~~~~~~~~~~~~~~~~~~~~~~~~~~~~~~~~~~~~~~~~~~~~~~~~~~~~~~~  DR. PENUMALLI'S GUIDE TO HAPPY AND HEALTHY LIVING These are some of my general health and wellness recommendations. Some of them may apply to you better than others. Please use common sense as you try these suggestions and feel free to ask me any questions.   ACTIVITY/FITNESS Mental, social, emotional and physical stimulation are very important for brain and body health. Try learning a new activity (arts, music, language, sports, games).  Keep moving your body to the best of your abilities. You can do this at home, inside or outside, the park, community center, gym or anywhere you like. Consider a physical therapist or personal trainer to get started. Consider the app Sworkit. Fitness trackers such as smart-watches, smart-phones or Fitbits can help as well.   NUTRITION Eat more plants: colorful vegetables, nuts, seeds and berries.  Eat less sugar, salt, preservatives and processed foods.  Avoid toxins such as cigarettes and alcohol.  Drink water when you are thirsty. Warm water with a slice of lemon is an excellent morning drink to start the day.  Consider these websites for more information The Nutrition Source (hhttps://www.henry-hernandez.biz/ Precision Nutrition (wWindowBlog.ch   RELAXATION Consider practicing mindfulness meditation or other relaxation  techniques such as deep breathing, prayer, yoga, tai chi, massage. See website mindful.org or the apps Headspace or Calm to help get started.   SLEEP Try to get at least 7-8+ hours sleep per day. Regular exercise and reduced caffeine will help you sleep better. Practice good sleep hygeine techniques. See website sleep.org for more information.   PLANNING Prepare estate planning, living will, healthcare POA documents. Sometimes this is best planned with the help of an attorney. Theconversationproject.org and agingwithdignity.org are excellent resources.

## 2015-02-25 NOTE — Progress Notes (Signed)
[facility]  PATIENT: [name] DOB: 17-Dec-1965  REFERRING CLINICIAN: Hoskins HISTORY FROM: patient and husband REASON FOR VISIT: follow up   HISTORICAL  CHIEF COMPLAINT:  Chief Complaint  Patient presents with  . Seizures    rm 6, friend - Therapist, nutritionalBilly  . Follow-up    3 month    HISTORY OF PRESENT ILLNESS:   UPDATE 02/25/15: Since last visit, no seizures. Hot flash sensations had subsided for a while, but now coming back since Sept 2016.   PRIOR HPI (11/25/14): 49 year old right-handed female here for valuation of seizure. Patient has history of depression, anxiety, IBS, COPD. Also history of eating disorder, prior traumatic experiences and stresses. 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 These are normal for her over the past 1-2 years. However without warning patient lost consciousness. She was found by the resident of the home. 911 was called and paramedics arrived. Apparently patient was noted to have seizure activity. She was combative en route to the hospital. Patient had a second witnessed generalized seizure in the emergency room. She was imaged in the hospital for evaluation. She was started on antiseizure medication. MRI of the brain and EEG were unremarkable. Patient was discharged home and has been doing well. Patient reports intermittent anxiety, panic attack, hot flash sensations throughout her life. These have subsided significantly since starting antiseizure medication. Patient has family history of seizure in her nephew. Her sister has multiple sclerosis.  REVIEW OF SYSTEMS: Full 14 system review of systems performed and notable only for numbness dizziness insomnia sleepiness depression anxiety feeling hot feeling cold flushin.    ALLERGIES: Allergies  Allergen Reactions  . Effexor [Venlafaxine Hcl] Other (See Comments)    Dizziness, pain behind eye  . Ampicillin Rash  . Neomycin Rash   . Penicillins Rash    HOME MEDICATIONS: Outpatient Prescriptions Prior to Visit  Medication Sig Dispense Refill  . ALPRAZolam (XANAX) 0.5 MG tablet TAKE (1) TABLET BY MOUTH THREE TIMES DAILY AS NEEDED FOR ANXIETY. MONTHLY REFILL. 75 tablet 5  . DULoxetine (CYMBALTA) 30 MG capsule Take 1 capsule (30 mg total) by mouth daily. 90 capsule 2  . DULoxetine (CYMBALTA) 60 MG capsule TAKE 1 CAPSULE BY MOUTH EVERY DAY 90 capsule 1  . levETIRAcetam (KEPPRA) 250 MG tablet Take 1 tablet (250 mg total) by mouth 2 (two) times daily. 180 tablet 3  . promethazine (PHENERGAN) 25 MG tablet TAKE 1 TABLET BY MOUTH EVERY FOUR HOURS AS NEEDED FOR NAUEA/VOMITING. 30 tablet 0  . traZODone (DESYREL) 50 MG tablet Take two at bedtime 180 tablet 1   No facility-administered medications prior to visit.    PAST MEDICAL HISTORY: Past Medical History  Diagnosis Date  . Depression   . Anxiety   . COPD (chronic obstructive pulmonary disease) ([facility])   . 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 IBS (irritable bowel syndrome)   . Shingles 11/12/14    left eye  . Chiari malformation   . Seizures ([facility]) 10/23/14    x 2    PAST SURGICAL HISTORY: Past Surgical History  Procedure Laterality Date  . Tubal ligation  1988  . Partial hysterectomy  2006  . I&d extremity Left 07/17/2014    Procedure: IRRIGATION AND DEBRIDEMENT left index finger;  Surgeon: Betha LoaKevin Kuzma, MD;  [facility];  Service: Orthopedics;  Laterality: Left;  . Nerve, tendon and artery repair Left 07/17/2014    Procedure: NERVE,  TENDON AND ARTERY REPAIR LEFT INDEX FINGER;  Surgeon: Betha Loa, MD;  Location: [facility] OR;  Service: Orthopedics;  Laterality: Left;    FAMILY HISTORY: Family History  Problem Relation Age of Onset  . Hypertension Sister   . Depression Sister   . Drug abuse Sister   . Hypertension Mother   . Osteoporosis Mother   . Diabetes Mother   . Alcohol abuse Mother   . Depression Mother   . Heart disease Father     MI  . Depression  Father   . Depression Sister   . Multiple sclerosis Sister   . Colon cancer Neg Hx   . Cancer - Lung Maternal Aunt   . Leukemia Maternal Aunt     SOCIAL HISTORY:  Social History   Social History  . Marital Status: Married    Spouse Name: [name]  . Number of Children: 1  . Years of Education: 16   Occupational History  . home, yrad maintenance     self employed   Social History Main Topics  . Smoking status: Current Every Day Smoker -- 0.50 packs/day for 30 years    Types: Cigarettes  . Smokeless tobacco: Never Used  . Alcohol Use: Yes     Comment: occasionally, 11/25/14 once a week  . Drug Use: Yes    Special: Marijuana  . Sexual Activity: Not Currently    Birth Control/ Protection: Surgical   Other Topics Concern  . Not on file   Social History Narrative   Lives at home with husband   Caffeine use- coffee, sodas all day     PHYSICAL EXAM  GENERAL EXAM/CONSTITUTIONAL: Vitals:  Filed Vitals:   02/25/15 1301  BP: 95/61  Pulse: 74  Height:  (1.702 m)  Weight: 101 lb 6.4 oz (45.995 kg)   Body mass index is 15.88 kg/(m^2). No exam data present  Patient is in no distress; well developed, nourished and groomed; neck is supple  CARDIOVASCULAR:  Examination of carotid arteries is normal; no carotid bruits  Regular rate and rhythm, no murmurs  Examination of peripheral vascular system by observation and palpation is normal  EYES:  Ophthalmoscopic exam of optic discs and posterior segments is normal; no papilledema or hemorrhages  MUSCULOSKELETAL:  Gait, strength, tone, movements noted in Neurologic exam below  NEUROLOGIC: MENTAL STATUS:  No flowsheet data found.  awake, alert, oriented to person, place and time  recent and remote memory intact  normal attention and concentration  language fluent, comprehension intact, naming intact,   fund of knowledge appropriate  CRANIAL NERVE:   2nd - no papilledema on fundoscopic exam  2nd,  3rd, 4th, 6th - pupils equal and reactive to light, visual fields full to confrontation, extraocular muscles intact, no nystagmus  5th - facial sensation symmetric  7th - facial strength symmetric  8th - hearing intact  9th - palate elevates symmetrically, uvula midline  11th - shoulder shrug symmetric  12th - tongue protrusion midline  MOTOR:   normal bulk and tone, full strength in the BUE, BLE  SENSORY:   normal and symmetric to light touch, temperature, vibration   COORDINATION:   finger-nose-finger, fine finger movements normal  REFLEXES:   deep tendon reflexes present and symmetric  GAIT/STATION:   narrow based gait; able to walk tandem; romberg is negative    DIAGNOSTIC DATA (LABS, IMAGING, TESTING) - I reviewed patient records, labs, notes, testing and imaging myself where available.  Lab Results  Component Value Date  WBC 11.7* 10/24/2014   HGB 11.1* 10/24/2014   HCT 33.9* 10/24/2014   MCV 98.8 10/24/2014   PLT 189 10/24/2014      Component Value Date/Time   NA 138 10/24/2014 0606   K 3.7 10/24/2014 0606   CL 106 10/24/2014 0606   CO2 26 10/24/2014 0606   GLUCOSE 89 10/24/2014 0606   BUN 14 10/24/2014 0606   CREATININE 0.83 10/24/2014 0606   CREATININE 0.76 06/03/2013 1007   CALCIUM 8.7* 10/24/2014 0606   PROT 5.8* 10/24/2014 0606   ALBUMIN 3.8 10/24/2014 0606   AST 23 10/24/2014 0606   ALT 19 10/24/2014 0606   ALKPHOS 35* 10/24/2014 0606   BILITOT 1.0 10/24/2014 0606   GFRNONAA >60 10/24/2014 0606   GFRAA >60 10/24/2014 0606   Lab Results  Component Value Date   CHOL 191 12/25/2012   HDL 52 12/25/2012   LDLCALC 117* 12/25/2012   TRIG 109 12/25/2012   CHOLHDL 3.7 12/25/2012   No results found for: HGBA1C No results found for: [id number] Lab Results  Component Value Date   TSH 1.391 10/23/2014   01/30/12 MRI brain [I reviewed images myself and agree with interpretation. -VRP]  - normal   10/23/14 MRI brain [I reviewed images  myself. I think patient has mild cerebellar tonsillar ectopia ~31mm. This is borderline for chiari malformation. -VRP]  1. Chiari 1 malformation. The cerebellar tonsils extend 7 mm below the foramen magnum in the midline. 2. Otherwise normal MRI of the brain.  10/24/14 EEG - normal    ASSESSMENT AND PLAN  49 y.o. year old female here with new onset seizure x 2. Agree with continuing anti-seizure medication.   Dx: seizure disorder (? Partial onset with secondary generalization)   PLAN: - increase levetiracetam to  twice a day. - According to [place] law, you can not drive unless you are seizure free for at least 6 months and under physician's care. Last seizure was 10/23/14.  Meds ordered this encounter  Medications  . DISCONTD: levETIRAcetam (KEPPRA) 500 MG tablet    Sig: Take 1 tablet (500 mg total) by mouth 2 (two) times daily.    Dispense:  180 tablet    Refill:  4  . levETIRAcetam (KEPPRA) 500 MG tablet    Sig: Take 1 tablet (500 mg total) by mouth 2 (two) times daily.    Dispense:  60 tablet    Refill:  6    Backup paper rx   Return in about 6 months (around 08/25/2015).    Suanne Marker, MD 02/25/2015, 2:00 PM Certified in Neurology, Neurophysiology and Neuroimaging  [facility] [address] [place], [place] [address] [phone]

## 2015-02-27 ENCOUNTER — Other Ambulatory Visit: Payer: Self-pay | Admitting: Nurse Practitioner

## 2015-02-27 NOTE — Telephone Encounter (Signed)
This and 4 refills 

## 2015-03-02 ENCOUNTER — Encounter (HOSPITAL_COMMUNITY): Payer: Self-pay | Admitting: Psychology

## 2015-03-02 NOTE — Progress Notes (Signed)
Patient:  [name]   DOB: 15-Jul-1965  MR Number: [id number]  [facility] [address] [place] [address] Dept: [phone]  Start: 1 PM End: 2 PM  Provider/Observer:     Hershal CoriaJohn R Kerwin Augustus PSYD  Chief Complaint:      Chief Complaint  Patient presents with  . Depression    Reason For Service:     The patient returned after being seen here many years ago. Review of my medical records show that I last saw her 2007 because of significant major depressive disorder with major stressors in her family related to conflicts between her and her husband. She has since divorced this husband and had been doing fairly well. However, she recently remarried and he situation with her new husband is not going very well. He also is attempting to make efforts to control her even though this was not something that he appear to be doing when they were dating. The patient has not been coping very well at this and symptoms of depression have returned worsened.  I have not seen her for some time again (2 years) and she returns reporting that she has had a lot of stress recently primarily around her family. She has been losing weight and has had a number of immune system issues including having an outbreak of shingles. The patient reports that she is down under 100 pounds now. She reports that her depression has continued to be significant. She reports that she has been getting along with her husband again.   Interventions Strategy:  Cognitive/behavioral psychotherapeutic interventions  Participation Level:   Active  Participation Quality:  Appropriate      Behavioral Observation:  Well Groomed, Alert, and Appropriate.   Current Psychosocial Factors: The patient reports that she has contineud to do poorly.  She reports that her sleep has been better but there continues to be a lot of stress in her life from  family members.  Content of Session:   Review current symptoms and continued work on therapeutic interventions for recurrent major depressive events.  Current Status:   The patient reports that she has been having more issues with her sisters, who always need her help but rarely take it the right way.  Patient Progress:   The patient has had a recent deterioration of her overall functioning.  Target Goals:   Target goals include reducing the intensity, duration, and severity of depressive events including symptoms of helplessness and hopelessness, anhedonia, and social isolation and withdrawal.  Last Reviewed:   01/04/2015  Goals Addressed Today:    Today we worked on coping skills are in issues of recurrent depression particular in the issue of the intensity of symptoms.  Impression/Diagnosis:  The patient has a long history of recurrent depressive symptoms major depressive disorder. Current stressors right now have to do with her new marriage and the patient is having difficulty coping with this. She returns to this office due to this issue.   Diagnosis:    Axis I: Major depressive disorder, recurrent episode, moderate (HCC)      Axis II: Deferred

## 2015-03-02 NOTE — Progress Notes (Signed)
Patient:  [name]   DOB: [birth year]  MR Number: [id number]  Location: [facility] [address] [place] [address] Dept: [phone]  Start: 1 PM End: 2 PM  Provider/Observer:     Hershal CoriaJohn R Rodenbough PSYD  Chief Complaint:      Chief Complaint  Patient presents with  . Anxiety  . Depression    Reason For Service:     The patient returned after being seen here many years ago. Review of my medical records show that I last saw her 2007 because of significant major depressive disorder with major stressors in her family related to conflicts between her and her husband. She has since divorced this husband and had been doing fairly well. However, she recently remarried and he situation with her new husband is not going very well. He also is attempting to make efforts to control her even though this was not something that he appear to be doing when they were dating. The patient has not been coping very well at this and symptoms of depression have returned worsened.  I have not seen her for some time again (2 years) and she returns reporting that she has had a lot of stress recently primarily around her family. She has been losing weight and has had a number of immune system issues including having an outbreak of shingles. The patient reports that she is down under 100 pounds now. She reports that her depression has continued to be significant. She reports that she has been getting along with her husband again.   Interventions Strategy:  Cognitive/behavioral psychotherapeutic interventions  Participation Level:   Active  Participation Quality:  Appropriate      Behavioral Observation:  Well Groomed, Alert, and Appropriate.   Current Psychosocial Factors: The patient reports that she has contineud to do poorly.  She reports that she has been working on her sleep, but issues with family are very  stressful.  Content of Session:   Review current symptoms and continued work on therapeutic interventions for recurrent major depressive events.  Current Status:   The patient reports that she has been having more issues with her sisters, who always need her help but rarely take it the right way.  Patient Progress:   The patient has had a recent deterioration of her overall functioning.  Target Goals:   Target goals include reducing the intensity, duration, and severity of depressive events including symptoms of helplessness and hopelessness, anhedonia, and social isolation and withdrawal.  Last Reviewed:   12/22/2014  Goals Addressed Today:    Today we worked on coping skills are in issues of recurrent depression particular in the issue of the intensity of symptoms.  Impression/Diagnosis:  The patient has a long history of recurrent depressive symptoms major depressive disorder. Current stressors right now have to do with her new marriage and the patient is having difficulty coping with this. She returns to this office due to this issue.   Diagnosis:    Axis I: Major depressive disorder, recurrent episode, moderate (HCC)      Axis II: Deferred

## 2015-03-02 NOTE — Progress Notes (Signed)
Patient:  [name]   DOB: 1966-03-31  MR Number: [id number]  [facility] [address] [place] [address] Dept: [phone]  Start: 9 AM End: 10 AM  Provider/Observer:     Hershal CoriaJohn R Rodenbough PSYD  Chief Complaint:      Chief Complaint  Patient presents with  . Agitation  . Anxiety  . Stress    Reason For Service:     The patient returned after being seen here many years ago. Review of my medical records show that I last saw her 2007 because of significant major depressive disorder with major stressors in her family related to conflicts between her and her husband. She has since divorced this husband and had been doing fairly well. However, she recently remarried and he situation with her new husband is not going very well. He also is attempting to make efforts to control her even though this was not something that he appear to be doing when they were dating. The patient has not been coping very well at this and symptoms of depression have returned worsened.  I have not seen her for some time again (2 years) and she returns reporting that she has had a lot of stress recently primarily around her family. She has been losing weight and has had a number of immune system issues including having an outbreak of shingles. The patient reports that she is down under 100 pounds now. She reports that her depression has continued to be significant. She reports that she has been getting along with her husband again.   Interventions Strategy:  Cognitive/behavioral psychotherapeutic interventions  Participation Level:   Active  Participation Quality:  Appropriate      Behavioral Observation:  Well Groomed, Alert, and Appropriate.   Current Psychosocial Factors: The patient reports that she has done better with coping with issues with siblings.  She reports that she has not been talking with them  much, but misses having imput on their lives in a positive way.  Content of Session:   Review current symptoms and continued work on therapeutic interventions for recurrent major depressive events.  Current Status:   The patient reports that the recent increase in her depression has improved somewhat and that she is working on her coping skills and applying them in a helpful way  Patient Progress:   The patient has had a recent deterioration of her overall functioning by some recent improvements.  Target Goals:   Target goals include reducing the intensity, duration, and severity of depressive events including symptoms of helplessness and hopelessness, anhedonia, and social isolation and withdrawal.  Last Reviewed:   02/25/2015  Goals Addressed Today:    Today we worked on coping skills are in issues of recurrent depression particular in the issue of the intensity of symptoms.  Impression/Diagnosis:  The patient has a long history of recurrent depressive symptoms major depressive disorder. Current stressors right now have to do with her new marriage and the patient is having difficulty coping with this. She returns to this office due to this issue.   Diagnosis:    Axis I: Major depressive disorder, recurrent episode, moderate (HCC)      Axis II: Deferred

## 2015-03-25 ENCOUNTER — Ambulatory Visit (HOSPITAL_COMMUNITY): Payer: Self-pay | Admitting: Psychiatry

## 2015-04-07 ENCOUNTER — Ambulatory Visit (HOSPITAL_COMMUNITY): Payer: BLUE CROSS/BLUE SHIELD | Admitting: Psychology

## 2015-04-07 DIAGNOSIS — F331 Major depressive disorder, recurrent, moderate: Secondary | ICD-10-CM | POA: Diagnosis not present

## 2015-04-21 ENCOUNTER — Ambulatory Visit: Payer: BLUE CROSS/BLUE SHIELD | Admitting: Family Medicine

## 2015-05-08 ENCOUNTER — Ambulatory Visit (INDEPENDENT_AMBULATORY_CARE_PROVIDER_SITE_OTHER): Payer: BLUE CROSS/BLUE SHIELD | Admitting: Psychology

## 2015-05-08 DIAGNOSIS — F331 Major depressive disorder, recurrent, moderate: Secondary | ICD-10-CM | POA: Diagnosis not present

## 2015-05-11 ENCOUNTER — Encounter (HOSPITAL_COMMUNITY): Payer: Self-pay | Admitting: Psychology

## 2015-05-11 NOTE — Progress Notes (Signed)
Patient:  [name]   DOB: 1965/10/01  MR Number: [id number]  Location: BEHAVIORAL [facility] [address] [place] [address] Dept: [phone]  Start: 9 AM End: 10 AM  Provider/Observer:     Hershal Coria PSYD  Chief Complaint:      Chief Complaint  Patient presents with  . Depression  . Stress    Reason For Service:     The patient returned after being seen here many years ago. Review of my medical records show that I last saw her 2007 because of significant major depressive disorder with major stressors in her family related to conflicts between her and her husband. She has since divorced this husband and had been doing fairly well. However, she recently remarried and he situation with her new husband is not going very well. He also is attempting to make efforts to control her even though this was not something that he appear to be doing when they were dating. The patient has not been coping very well at this and symptoms of depression have returned worsened.  I have not seen her for some time again (2 years) and she returns reporting that she has had a lot of stress recently primarily around her family. She has been losing weight and has had a number of immune system issues including having an outbreak of shingles. The patient reports that she is down under 100 pounds now. She reports that her depression has continued to be significant. She reports that she has been getting along with her husband again.   Interventions Strategy:  Cognitive/behavioral psychotherapeutic interventions  Participation Level:   Active  Participation Quality:  Appropriate      Behavioral Observation:  Well Groomed, Alert, and Appropriate.   Current Psychosocial Factors: The patient reports that she has done better with coping with issues with siblings.  She reports that she has not been talking with them much, but  misses having imput on their lives in a positive way.  Content of Session:   Review current symptoms and continued work on therapeutic interventions for recurrent major depressive events.  Current Status:   The patient reports that the recent increase in her depression has improved somewhat and that she is working on her coping skills and applying them in a helpful way  Patient Progress:   The patient has had a recent deterioration of her overall functioning by some recent improvements.  Target Goals:   Target goals include reducing the intensity, duration, and severity of depressive events including symptoms of helplessness and hopelessness, anhedonia, and social isolation and withdrawal.  Last Reviewed:   05/07/2014  Goals Addressed Today:    Today we worked on coping skills are in issues of recurrent depression particular in the issue of the intensity of symptoms.  Impression/Diagnosis:  The patient has a long history of recurrent depressive symptoms major depressive disorder. Current stressors right now have to do with her new marriage and the patient is having difficulty coping with this. She returns to this office due to this issue.   Diagnosis:    Axis I: Major depressive disorder, recurrent episode, moderate (HCC)      Axis II: Deferred

## 2015-05-12 ENCOUNTER — Encounter: Payer: Self-pay | Admitting: Family Medicine

## 2015-05-12 ENCOUNTER — Ambulatory Visit (INDEPENDENT_AMBULATORY_CARE_PROVIDER_SITE_OTHER): Payer: BLUE CROSS/BLUE SHIELD | Admitting: Family Medicine

## 2015-05-12 VITALS — BP 110/68 | Ht 67.0 in | Wt 106.0 lb

## 2015-05-12 DIAGNOSIS — G4700 Insomnia, unspecified: Secondary | ICD-10-CM | POA: Insufficient documentation

## 2015-05-12 DIAGNOSIS — E785 Hyperlipidemia, unspecified: Secondary | ICD-10-CM

## 2015-05-12 DIAGNOSIS — R739 Hyperglycemia, unspecified: Secondary | ICD-10-CM | POA: Diagnosis not present

## 2015-05-12 DIAGNOSIS — Z720 Tobacco use: Secondary | ICD-10-CM | POA: Insufficient documentation

## 2015-05-12 MED ORDER — TRAZODONE HCL 50 MG PO TABS: ORAL_TABLET | ORAL | Status: DC

## 2015-05-12 MED ORDER — ALPRAZOLAM 0.5 MG PO TABS: ORAL_TABLET | ORAL | Status: DC

## 2015-05-12 NOTE — Progress Notes (Signed)
   Subjective:    Patient ID: [name], female    DOB: [birth year], 50 y.o.   MRN: [id number]  HPI Med check up. Pt stopped cymbalta on her own.   Pt states she had to start keppra and taking all those meds were making her sick.   Pt would like to discuss ways to quit smoking.   pt states she came in three times when she had shingles. One time she was told she would not be charged for visit. She paid copay when she came in and then still received a bill. Pt states she is now in collections.     Review of Systems Patient denies any chest tightness pressure pain shortness of breath denies being depressed currently.    Objective:   Physical Exam Lungs are clear hearts regular extremities no edema skin warm dry blood pressure good       Assessment & Plan:  #1 insomnia trazodone as directed should help her with sleep #2 tobacco abuse counseled her on quitting she will try patches #3 chronic anxiety Xanax #4 financial stress will talk with Marchelle Folks regarding whether or not we can help her get some of her bill of removed Follow-up within 3 in 6 months sooner problems

## 2015-05-12 NOTE — Patient Instructions (Signed)
Dear Patient,  It has been recommended to you that you have a colonoscopy. It is your responsibility to carry through with this recommendation.   Did you realize that colon cancer is the second leading cancer killer in the [place]. One in every 20 adults will get colon cancer. If all adults would go through the recommended screening for colon cancer (getting a colonoscopy), then there would be a 60% reduction in the number of people dying from colon cancer.  Colon cancer just doesn't come out of the blue. It starts off as a small polyp which over time grows into a cancer. A colonoscopy can prevent cancer and in many cases detected when it is at a very treatable phase. Small colon cancers can have cure rates of 95%. Advanced colon cancer, which often occurs in people who do not do their screenings, have cure rates less than 20%. The risk of colon cancer advances with age. Most adults should have regular colonoscopies every 10 years starting at age 50. This recommendation can vary depending on a person's medical history.  Health-care laws now allow for you to call the gastroenterologist office directly in order to set yourself up for this very important tests. Today we have recommended to you that you do this test. This test may save your life. Failure to do this test puts you at risk for premature death from colon cancer. Do the right thing and schedule this test now.  Here as a list of specialists we recommend in the surrounding area. When you call their office let them know that you are a patient of our practice in your interested in doing a screening colonoscopy. They should assist you without problems. You will need the following information when you called them: 1-name of which Dr. you see, 2-your insurance information, 3-a list of medications that you currently take, 4-any allergies you have to medications.  [place] gastroenterologist Dr. Mike Rourk, Dr Sandi Fields   [place]  gastroenterologist   [phone]  Dr.Najeeb Rehman [facility]   [phone]  [place] [facility] (Dr. Perry, N, Stark, Brodie, Gesner, Jacobs and Pyrtle) [phone]  [facility] (Dr. Buscemi, Edwards, Hayes, Maygod,Outlaw,Schooler) [phone]  Each group of specialists has assured us that when you called them they will help you get your colonoscopy set up. Should you have problems or if the GI practice insist a referral be done please let us know. Be sure to call soon. Sincerely, Carolyn Hoskins, Dr Steve Luking, Dr.Scott Luking    

## 2015-05-29 LAB — LIPID PANEL
CHOL/HDL RATIO: 3 ratio (ref 0.0–4.4)
Cholesterol, Total: 194 mg/dL (ref 100–199)
HDL: 64 mg/dL (ref >39)
LDL Calculated: 116 mg/dL — ABNORMAL HIGH (ref 0–99)
TRIGLYCERIDES: 69 mg/dL (ref 0–149)
VLDL Cholesterol Cal: 14 mg/dL (ref 5–40)

## 2015-05-29 LAB — GLUCOSE, RANDOM: Glucose: 99 mg/dL (ref 65–99)

## 2015-05-31 ENCOUNTER — Encounter: Payer: Self-pay | Admitting: Family Medicine

## 2015-06-02 ENCOUNTER — Encounter (HOSPITAL_COMMUNITY): Payer: Self-pay | Admitting: Psychology

## 2015-06-02 NOTE — Progress Notes (Signed)
  Patient:  [name]   DOB: 1965/08/07  MR Number: [id number]  Location: BEHAVIORAL [facility] [address] [place] [address] Dept: [phone]  Start: 9 AM End: 10 AM  Provider/Observer:     Hershal Coria PSYD  Chief Complaint:      Chief Complaint  Patient presents with  . Depression  . Stress  . Trauma    Reason For Service:     The patient returned after being seen here many years ago. Review of my medical records show that I last saw her 2007 because of significant major depressive disorder with major stressors in her family related to conflicts between her and her husband. She has since divorced this husband and had been doing fairly well. However, she recently remarried and he situation with her new husband is not going very well. He also is attempting to make efforts to control her even though this was not something that he appear to be doing when they were dating. The patient has not been coping very well at this and symptoms of depression have returned worsened.  I have not seen her for some time again (2 years) and she returns reporting that she has had a lot of stress recently primarily around her family. She has been losing weight and has had a number of immune system issues including having an outbreak of shingles. The patient reports that she is down under 100 pounds now. She reports that her depression has continued to be significant. She reports that she has been getting along with her husband again.   Interventions Strategy:  Cognitive/behavioral psychotherapeutic interventions  Participation Level:   Active  Participation Quality:  Appropriate      Behavioral Observation:  Well Groomed, Alert, and Appropriate.   Current Psychosocial Factors: The patient reports that she has done better with coping with issues with siblings.  She reports that she has not been talking with them  much, but misses having imput on their lives in a positive way.  Content of Session:   Review current symptoms and continued work on therapeutic interventions for recurrent major depressive events.  Current Status:   The patient reports that the recent increase in her depression has improved somewhat and that she is working on her coping skills and applying them in a helpful way  Patient Progress:   The patient has had a recent deterioration of her overall functioning by some recent improvements.  Target Goals:   Target goals include reducing the intensity, duration, and severity of depressive events including symptoms of helplessness and hopelessness, anhedonia, and social isolation and withdrawal.  Last Reviewed:    04/07/2015  Goals Addressed Today:    Today we worked on coping skills are in issues of recurrent depression particular in the issue of the intensity of symptoms.  Impression/Diagnosis:  The patient has a long history of recurrent depressive symptoms major depressive disorder. Current stressors right now have to do with her new marriage and the patient is having difficulty coping with this. She returns to this office due to this issue.   Diagnosis:    Axis I: Major depressive disorder, recurrent episode, moderate (HCC)      Axis II: Deferred

## 2015-06-10 ENCOUNTER — Ambulatory Visit (INDEPENDENT_AMBULATORY_CARE_PROVIDER_SITE_OTHER): Payer: BLUE CROSS/BLUE SHIELD | Admitting: Psychology

## 2015-06-10 DIAGNOSIS — F331 Major depressive disorder, recurrent, moderate: Secondary | ICD-10-CM

## 2015-06-17 ENCOUNTER — Telehealth (HOSPITAL_COMMUNITY): Payer: Self-pay | Admitting: *Deleted

## 2015-06-17 ENCOUNTER — Telehealth: Payer: Self-pay | Admitting: *Deleted

## 2015-06-17 NOTE — Telephone Encounter (Signed)
See note below. Pt was also advised to go directly to ED if she needed immediate treatment. Pt declined and wanted note sent back to dr scott.

## 2015-06-17 NOTE — Telephone Encounter (Signed)
#  1 I would recommend that we could go up on the dose of Xanax temporarily. The maximum she could take would be one every 4 hours as needed. Patient should have enough medication to last her until she is seen tomorrow. Caution drowsiness do not drive with the increased dose of medicine. #2 I can see her on Thursday.(We are completely booked for today)  #3 if the patient feels she cannot control her symptoms or if she is feeling suicidal she should go to the ER for behavioral emergency assessment #4 I believe the patient has progressed to the point where she would benefit from seeing mental health. Please initiate referral, please discuss and document regarding suicidal ideation.

## 2015-06-17 NOTE — Telephone Encounter (Signed)
Patient has appt tomorrow with Dr Lorin PicketScott. Patient states she already sees Dr Kieth Brightlyodenbough but he is out of the office this week.

## 2015-06-17 NOTE — Telephone Encounter (Signed)
phone call from patient, she can't quit crying, she can't make decisions about anything.

## 2015-06-17 NOTE — Telephone Encounter (Signed)
Called pt and she made appt with Dr. Kieth Brightlyodenbough and pt is aware if she need to go to the emergency to going ahead. Per pt, she have a friend that will be coming by her house in 20-30-minutes. Per pt, she will have to have them take her to the ER if she feels like she need to go.

## 2015-06-17 NOTE — Telephone Encounter (Signed)
If suicidal, she will need to go to ER, otherwise she will need to be scheduled to come in

## 2015-06-17 NOTE — Telephone Encounter (Signed)
Pt called crying non stop, not sleeping. Wants to be seen for anxiety. Pt states she has taken one and a half xanax in the past 3 hours and it has not helped any. Taking trazadone for sleep. Not helping. Pt states she is not sucidial.

## 2015-06-17 NOTE — Telephone Encounter (Signed)
Spoke with patient and informed her per Dr.Scott Luking- #1 I would recommend that we could go up on the dose of Xanax temporarily. The maximum she could take would be one every 4 hours as needed. Patient should have enough medication to last her until she is seen tomorrow. Caution drowsiness do not drive with the increased dose of medicine. #2 I can see her on Thursday.(We are completely booked for today) #3 if the patient feels she cannot control her symptoms or if she is feeling suicidal she should go to the ER for behavioral emergency assessment #4 I believe the patient has progressed to the point where she would benefit from seeing mental health. Patient verbalized understanding and stated that she is currently already seeing Dr.Rodenbough for her anxiety and depression.

## 2015-06-17 NOTE — Telephone Encounter (Signed)
I recommend putting in additional consultation she needs to see psychiatrists associated with Dr. Claudine Moutonodenberg. I will still be happy to see the patient in the interim but she needs an opinion from the psychiatrist. Dr. Claudine Moutonodenberg is the psychologist counselor.

## 2015-06-18 ENCOUNTER — Encounter: Payer: Self-pay | Admitting: Family Medicine

## 2015-06-18 ENCOUNTER — Ambulatory Visit (INDEPENDENT_AMBULATORY_CARE_PROVIDER_SITE_OTHER): Payer: BLUE CROSS/BLUE SHIELD | Admitting: Family Medicine

## 2015-06-18 VITALS — BP 122/80 | Ht 67.0 in | Wt 101.2 lb

## 2015-06-18 DIAGNOSIS — F410 Panic disorder [episodic paroxysmal anxiety] without agoraphobia: Secondary | ICD-10-CM | POA: Insufficient documentation

## 2015-06-18 DIAGNOSIS — F418 Other specified anxiety disorders: Secondary | ICD-10-CM | POA: Diagnosis not present

## 2015-06-18 DIAGNOSIS — F320 Major depressive disorder, single episode, mild: Secondary | ICD-10-CM

## 2015-06-18 DIAGNOSIS — F411 Generalized anxiety disorder: Secondary | ICD-10-CM

## 2015-06-18 MED ORDER — FLUOXETINE HCL 20 MG PO TABS: 20.0000 mg | ORAL_TABLET | Freq: Every day | ORAL | Status: DC

## 2015-06-18 MED ORDER — QUETIAPINE FUMARATE 25 MG PO TABS: 25.0000 mg | ORAL_TABLET | Freq: Every day | ORAL | Status: DC

## 2015-06-18 MED ORDER — ALPRAZOLAM 0.5 MG PO TABS: ORAL_TABLET | ORAL | Status: DC

## 2015-06-18 NOTE — Progress Notes (Signed)
   Subjective:    Patient ID: [name], female    DOB: [birth year], 50 y.o.   MRN: [id number]  Anxiety Presents for initial visit. Onset was 1 to 4 weeks ago. The problem has been gradually worsening. Symptoms include insomnia. Primary symptoms comment: crying. The severity of symptoms is causing significant distress. Nothing aggravates the symptoms. The quality of sleep is poor.   There are no known risk factors. Past treatments include nothing. The treatment provided no relief.   Patient states that she has no other concerns at this time.   denies being suicidal. States she split up with her husband suffering with anxiety nervousness sadness  Review of Systems  Psychiatric/Behavioral: The patient has insomnia.        Objective:   Physical Exam   lungs clear heart regular      Assessment & Plan:   depression along with anxiety. I recommend starting Prozac on a regular basis. Try Seroquel at nighttime to help with rest Xanax when necessary cautioned drowsiness no greater than 4 per day follow-up in a few weeks time   I offered to get her in appointment with psychiatry she stated that she could not afford at this point in time she is going to talk with the hospital see if she can get on charitable care

## 2015-07-01 ENCOUNTER — Ambulatory Visit (HOSPITAL_COMMUNITY): Payer: Self-pay | Admitting: Psychology

## 2015-07-13 ENCOUNTER — Ambulatory Visit: Payer: Self-pay | Admitting: Family Medicine

## 2015-07-22 ENCOUNTER — Ambulatory Visit (INDEPENDENT_AMBULATORY_CARE_PROVIDER_SITE_OTHER): Payer: BLUE CROSS/BLUE SHIELD | Admitting: Family Medicine

## 2015-07-22 ENCOUNTER — Encounter: Payer: Self-pay | Admitting: Family Medicine

## 2015-07-22 VITALS — BP 110/64 | Ht 67.0 in | Wt 103.0 lb

## 2015-07-22 DIAGNOSIS — F320 Major depressive disorder, single episode, mild: Secondary | ICD-10-CM | POA: Diagnosis not present

## 2015-07-22 DIAGNOSIS — R569 Unspecified convulsions: Secondary | ICD-10-CM | POA: Diagnosis not present

## 2015-07-22 MED ORDER — DULOXETINE HCL 60 MG PO CPEP: 60.0000 mg | ORAL_CAPSULE | Freq: Every day | ORAL | Status: DC

## 2015-07-22 NOTE — Progress Notes (Signed)
   Subjective:    Patient ID: [name], female    DOB: 09-30-1965, 50 y.o.   MRN: [id number]  Depression        This is a chronic problem.  Treatments tried: prozac- med not helping. Denies being suicidal. States depression doing better than what it was doing but still not great she would like to go back to using Cymbalta  She also has difficult time focusing staying on track. She states he has a hard time focusing hard time processing things isn't quite sure. Denies any other particular underlying issues. She states his been more present since she has had a seizure.    Review of Systems  Psychiatric/Behavioral: Positive for depression.  Patient denies any seizures recently. States she is taken her medicine.     Objective:   Physical Exam  Lungs clear heart regular      Assessment & Plan:  Depression not doing as well on the Prozac would like to try Cymbalta she states she took it in the past but when she had no insurance she could not afford it but now she has insurance she would like to go back on it we will go ahead and restart her she states she Arty has 30 mg at home she did best with the 60 mg should do 1:30 milligrams daily for the next month then start on the 60s she will follow-up here in 2-3 months sooner if any problems she denies being suicidal she will follow-up sooner problems she will see her psychologist for counseling  Patient also relates difficulty processing thinking and remembering she feels it's related to her seizures she doesn't know she also has some ADD as well I told her I did not feel comfortable starting any medication because of her underlying seizure disorder she is seen the neurologist within the next month she will discuss it further with them

## 2015-07-29 ENCOUNTER — Encounter (HOSPITAL_COMMUNITY): Payer: Self-pay | Admitting: Psychology

## 2015-07-29 ENCOUNTER — Ambulatory Visit (INDEPENDENT_AMBULATORY_CARE_PROVIDER_SITE_OTHER): Payer: Self-pay | Admitting: Psychology

## 2015-07-29 DIAGNOSIS — F331 Major depressive disorder, recurrent, moderate: Secondary | ICD-10-CM

## 2015-07-29 NOTE — Progress Notes (Signed)
Patient:  [name]   DOB: [birth year]  MR Number: [id number]  Location: [facility] [address] [place] [address] Dept: [phone]  Start: 1 PM End: 2 PM  Provider/Observer:     Hershal CoriaJohn R Ann Bohne PSYD  Chief Complaint:      Chief Complaint  Patient presents with  . Depression  . Stress  . Anxiety    Reason For Service:     The patient returned after being seen here many years ago. Review of my medical records show that I last saw her 2007 because of significant major depressive disorder with major stressors in her family related to conflicts between her and her husband. She has since divorced this husband and had been doing fairly well. However, she recently remarried and he situation with her new husband is not going very well. He also is attempting to make efforts to control her even though this was not something that he appear to be doing when they were dating. The patient has not been coping very well at this and symptoms of depression have returned worsened.  I have not seen her for some time again (2 years) and she returns reporting that she has had a lot of stress recently primarily around her family. She has been losing weight and has had a number of immune system issues including having an outbreak of shingles. The patient reports that she is down under 100 pounds now. She reports that her depression has continued to be significant. She reports that she has been getting along with her husband again.   Interventions Strategy:  Cognitive/behavioral psychotherapeutic interventions  Participation Level:   Active  Participation Quality:  Appropriate      Behavioral Observation:  Well Groomed, Alert, and Appropriate.   Current Psychosocial Factors: The patient reports that her husband has left for [place] and it looks like the marriage is over.  While this initially created a lot of  stress she has adjusted and is more stable now.  Worked on developing a plan for coping.  Content of Session:   Review current symptoms and continued work on therapeutic interventions for recurrent major depressive events.  Current Status:   The patient reports that the recent increase in her depression has improved somewhat and that she is working on her coping skills and applying them in a helpful way  Patient Progress:   The patient has had a recent deterioration of her overall functioning with marital conflicts but she is starting to improve and move on to adjusting.  Target Goals:   Target goals include reducing the intensity, duration, and severity of depressive events including symptoms of helplessness and hopelessness, anhedonia, and social isolation and withdrawal.  Last Reviewed:   07/29/2015  Goals Addressed Today:    Today we worked on coping skills are in issues of recurrent depression particular in the issue of the intensity of symptoms.  Impression/Diagnosis:  The patient has a long history of recurrent depressive symptoms major depressive disorder. Current stressors right now have to do with her new marriage and the patient is having difficulty coping with this. She returns to this office due to this issue.   Diagnosis:    Axis I: Major depressive disorder, recurrent episode, moderate (HCC)      Axis II: Deferred

## 2015-08-24 ENCOUNTER — Ambulatory Visit (HOSPITAL_COMMUNITY): Payer: Self-pay | Admitting: Psychology

## 2015-08-25 ENCOUNTER — Ambulatory Visit (INDEPENDENT_AMBULATORY_CARE_PROVIDER_SITE_OTHER): Payer: Self-pay | Admitting: Diagnostic Neuroimaging

## 2015-08-25 ENCOUNTER — Encounter: Payer: Self-pay | Admitting: Diagnostic Neuroimaging

## 2015-08-25 VITALS — BP 95/61 | HR 67 | Ht 67.0 in | Wt 102.2 lb

## 2015-08-25 DIAGNOSIS — G40909 Epilepsy, unspecified, not intractable, without status epilepticus: Secondary | ICD-10-CM

## 2015-08-25 MED ORDER — LEVETIRACETAM 500 MG PO TABS: 500.0000 mg | ORAL_TABLET | Freq: Two times a day (BID) | ORAL | Status: DC

## 2015-08-25 NOTE — Progress Notes (Signed)
[facility]  PATIENT: [name] DOB: 21-Apr-1965  REFERRING CLINICIAN: Hoskins HISTORY FROM: patient REASON FOR VISIT: follow up   HISTORICAL  CHIEF COMPLAINT:  Chief Complaint  Patient presents with  . Seizure disorder    rm 6, "no seizure activity in past 6 months"  . Follow-up    6 month    HISTORY OF PRESENT ILLNESS:   UPDATE 08/25/15: Since last visit, no seizures. Doing well. Tolerating meds. Now separating from husband and has no insurance.   UPDATE 02/25/15: Since last visit, no seizures. Hot flash sensations had subsided for a while, but now coming back since Sept 2016.   PRIOR HPI (11/25/14): 50 year old right-handed female here for valuation of seizure. Patient has history of depression, anxiety, IBS, COPD. Also history of eating disorder, prior traumatic experiences and stresses. 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 These are normal for her over the past 1-2 years. However without warning patient lost consciousness. She was found by the resident of the home. 911 was called and paramedics arrived. Apparently patient was noted to have seizure activity. She was combative en route to the hospital. Patient had a second witnessed generalized seizure in the emergency room. She was imaged in the hospital for evaluation. She was started on antiseizure medication. MRI of the brain and EEG were unremarkable. Patient was discharged home and has been doing well. Patient reports intermittent anxiety, panic attack, hot flash sensations throughout her life. These have subsided significantly since starting antiseizure medication. Patient has family history of seizure in her nephew. Her sister has multiple sclerosis.  REVIEW OF SYSTEMS: Full 14 system review of systems performed and notable only for numbness dizziness insomnia sleepiness depression anxiety feeling hot feeling cold flushin.     ALLERGIES: Allergies  Allergen Reactions  . Effexor [Venlafaxine Hcl] Other (See Comments)    Dizziness, pain behind eye  . Ampicillin Rash  . Neomycin Rash  . Penicillins Rash    HOME MEDICATIONS: Outpatient Prescriptions Prior to Visit  Medication Sig Dispense Refill  . ALPRAZolam (XANAX) 0.5 MG tablet 1 qid prn caution drowsiness 60 tablet 1  . DULoxetine (CYMBALTA) 60 MG capsule Take 1 capsule (60 mg total) by mouth daily. 30 capsule 5  . QUEtiapine (SEROQUEL) 25 MG tablet Take 1 tablet (25 mg total) by mouth at bedtime. 30 tablet 3  . levETIRAcetam (KEPPRA) 500 MG tablet Take 1 tablet (500 mg total) by mouth 2 (two) times daily. 60 tablet 6   No facility-administered medications prior to visit.    PAST MEDICAL HISTORY: Past Medical History  Diagnosis Date  . Depression   . Anxiety   . COPD (chronic obstructive pulmonary disease) (HCC)   . 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 IBS (irritable bowel syndrome)   . Shingles 11/12/14    left eye  . Chiari malformation   . Seizures (HCC) 10/23/14    x 2    PAST SURGICAL HISTORY: Past Surgical History  Procedure Laterality Date  . Tubal ligation  1988  . Partial hysterectomy  2006  . I&d extremity Left 07/17/2014    Procedure: IRRIGATION AND DEBRIDEMENT left index finger;  Surgeon: Betha Loa, MD;  Location: [facility];  Service: Orthopedics;  Laterality: Left;  . Nerve, tendon and artery repair Left 07/17/2014    Procedure: NERVE, TENDON AND ARTERY REPAIR LEFT INDEX FINGER;  Surgeon: Betha Loa, MD;  Location: [facility] OR;  Service:  Orthopedics;  Laterality: Left;  . Abdominal hysterectomy      partial 2010-Dr.Ferguson    FAMILY HISTORY: Family History  Problem Relation Age of Onset  . Hypertension Sister   . Depression Sister   . Drug abuse Sister   . Hypertension Mother   . Osteoporosis Mother   . Diabetes Mother   . Alcohol abuse Mother   . Depression Mother   . Heart disease Father     MI  . Depression Father    . Depression Sister   . Multiple sclerosis Sister   . Heart attack Sister   . Colon cancer Neg Hx   . Cancer - Lung Maternal Aunt   . Leukemia Maternal Aunt     SOCIAL HISTORY:  Social History   Social History  . Marital Status: Married    Spouse Name: [name]  . Number of Children: 1  . Years of Education: 16   Occupational History  . home, yard maintenance     self employed   Social History Main Topics  . Smoking status: Current Every Day Smoker -- 0.50 packs/day for 30 years    Types: Cigarettes  . Smokeless tobacco: Never Used  . Alcohol Use: Yes     Comment: occasionally, 11/25/14 once a week  . Drug Use: Yes    Special: Marijuana  . Sexual Activity: Not Currently    Birth Control/ Protection: Surgical   Other Topics Concern  . Not on file   Social History Narrative   Lives at home with husband   Caffeine use- coffee, sodas all day     PHYSICAL EXAM  GENERAL EXAM/CONSTITUTIONAL: Vitals:  Filed Vitals:   08/25/15 1437  BP: 95/61  Pulse: 67  Height:  (1.702 m)  Weight: 102 lb 3.2 oz (46.358 kg)   Body mass index is 16 kg/(m^2). No exam data present  Patient is in no distress; well developed, nourished and groomed; neck is supple  CARDIOVASCULAR:  Examination of carotid arteries is normal; no carotid bruits  Regular rate and rhythm, no murmurs  Examination of peripheral vascular system by observation and palpation is normal  EYES:  Ophthalmoscopic exam of optic discs and posterior segments is normal; no papilledema or hemorrhages  MUSCULOSKELETAL:  Gait, strength, tone, movements noted in Neurologic exam below  NEUROLOGIC: MENTAL STATUS:  No flowsheet data found.  awake, alert, oriented to person, place and time  recent and remote memory intact  normal attention and concentration  language fluent, comprehension intact, naming intact,   fund of knowledge appropriate  CRANIAL NERVE:   2nd - no papilledema on fundoscopic  exam  2nd, 3rd, 4th, 6th - pupils equal and reactive to light, visual fields full to confrontation, extraocular muscles intact, no nystagmus  5th - facial sensation symmetric  7th - facial strength symmetric  8th - hearing intact  9th - palate elevates symmetrically, uvula midline  11th - shoulder shrug symmetric  12th - tongue protrusion midline  MOTOR:   normal bulk and tone, full strength in the BUE, BLE  SENSORY:   normal and symmetric to light touch, temperature, vibration   COORDINATION:   finger-nose-finger, fine finger movements normal  REFLEXES:   deep tendon reflexes present and symmetric  GAIT/STATION:   narrow based gait; able to walk tandem; romberg is negative    DIAGNOSTIC DATA (LABS, IMAGING, TESTING) - I reviewed patient records, labs, notes, testing and imaging myself where available.  Lab Results  Component Value Date  WBC 11.7* 10/24/2014   HGB 11.1* 10/24/2014   HCT 33.9* 10/24/2014   MCV 98.8 10/24/2014   PLT 189 10/24/2014      Component Value Date/Time   NA 138 10/24/2014 0606   K 3.7 10/24/2014 0606   CL 106 10/24/2014 0606   CO2 26 10/24/2014 0606   GLUCOSE 99 05/28/2015 0832   GLUCOSE 89 10/24/2014 0606   BUN 14 10/24/2014 0606   CREATININE 0.83 10/24/2014 0606   CREATININE 0.76 06/03/2013 1007   CALCIUM 8.7* 10/24/2014 0606   PROT 5.8* 10/24/2014 0606   ALBUMIN 3.8 10/24/2014 0606   AST 23 10/24/2014 0606   ALT 19 10/24/2014 0606   ALKPHOS 35* 10/24/2014 0606   BILITOT 1.0 10/24/2014 0606   GFRNONAA >60 10/24/2014 0606   GFRAA >60 10/24/2014 0606   Lab Results  Component Value Date   CHOL 194 05/28/2015   HDL 64 05/28/2015   LDLCALC 116* 05/28/2015   TRIG 69 05/28/2015   CHOLHDL 3.0 05/28/2015   No results found for: HGBA1C No results found for: VITAMINB12 Lab Results  Component Value Date   TSH 1.391 10/23/2014   01/30/12 MRI brain [I reviewed images myself and agree with interpretation. -[facility]]  - normal    10/23/14 MRI brain [I reviewed images myself. 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 This is borderline for chiari malformation. -[facility]]  1. Chiari 1 malformation. The cerebellar tonsils extend 7 mm below the foramen magnum in the midline. 2. Otherwise normal MRI of the brain.  10/24/14 EEG - normal    ASSESSMENT AND PLAN  50 y.o. year old female here with new onset seizure x 2. Agree with continuing anti-seizure medication.   Dx: seizure disorder (? Partial onset with secondary generalization)   PLAN: - continue levetiracetam to 500mg  twice a day.   Meds ordered this encounter  Medications  . levETIRAcetam (KEPPRA) 500 MG tablet    Sig: Take 1 tablet (500 mg total) by mouth 2 (two) times daily.    Dispense:  60 tablet    Refill:  12   Return in about 1 year (around 08/24/2016).    Suanne MarkerVIKRAM R. PENUMALLI, MD 08/25/2015, 4:04 PM Certified in Neurology, Neurophysiology and Neuroimaging  [facility] [address], [address] [place], [place] [address] [phone]

## 2015-08-25 NOTE — Patient Instructions (Signed)

## 2015-09-16 ENCOUNTER — Encounter (HOSPITAL_COMMUNITY): Payer: Self-pay | Admitting: Psychology

## 2015-09-16 NOTE — Progress Notes (Signed)
Patient:  [name]   DOB: 16-Feb-1966  MR Number: [id number]  Location: BEHAVIORAL [facility] [address] [place] [address] Dept: [phone]  Start: 9 AM End: 10 AM  Provider/Observer:     Hershal CoriaJohn R Rodenbough PSYD  Chief Complaint:      Chief Complaint  Patient presents with  . Depression    Reason For Service:     The patient returned after being seen here many years ago. Review of my medical records show that I last saw her 2007 because of significant major depressive disorder with major stressors in her family related to conflicts between her and her husband. She has since divorced this husband and had been doing fairly well. However, she recently remarried and he situation with her new husband is not going very well. He also is attempting to make efforts to control her even though this was not something that he appear to be doing when they were dating. The patient has not been coping very well at this and symptoms of depression have returned worsened.  I have not seen her for some time again (2 years) and she returns reporting that she has had a lot of stress recently primarily around her family. She has been losing weight and has had a number of immune system issues including having an outbreak of shingles. The patient reports that she is down under 100 pounds now. She reports that her depression has continued to be significant. She reports that she has been getting along with her husband again.   Interventions Strategy:  Cognitive/behavioral psychotherapeutic interventions  Participation Level:   Active  Participation Quality:  Appropriate      Behavioral Observation:  Well Groomed, Alert, and Appropriate.   Current Psychosocial Factors: The patient reports that she has done better with coping with issues with siblings.  She reports that she has not been talking with them much, but misses having  imput on their lives in a positive way.  Content of Session:   Review current symptoms and continued work on therapeutic interventions for recurrent major depressive events.  Current Status:   The patient reports that the recent increase in her depression has improved somewhat and that she is working on her coping skills and applying them in a helpful way  Patient Progress:   The patient has had a recent deterioration of her overall functioning by some recent improvements.  Target Goals:   Target goals include reducing the intensity, duration, and severity of depressive events including symptoms of helplessness and hopelessness, anhedonia, and social isolation and withdrawal.  Last Reviewed:   06/10/2015  Goals Addressed Today:    Today we worked on coping skills are in issues of recurrent depression particular in the issue of the intensity of symptoms.  Impression/Diagnosis:  The patient has a long history of recurrent depressive symptoms major depressive disorder. Current stressors right now have to do with her new marriage and the patient is having difficulty coping with this. She returns to this office due to this issue.   Diagnosis:    Axis I: Major depressive disorder, recurrent episode, moderate (HCC)      Axis II: Deferred

## 2015-09-28 ENCOUNTER — Ambulatory Visit (INDEPENDENT_AMBULATORY_CARE_PROVIDER_SITE_OTHER): Payer: Self-pay | Admitting: Family Medicine

## 2015-09-28 ENCOUNTER — Encounter: Payer: Self-pay | Admitting: Family Medicine

## 2015-09-28 VITALS — BP 102/68 | Ht 67.0 in | Wt 99.6 lb

## 2015-09-28 DIAGNOSIS — M659 Synovitis and tenosynovitis, unspecified: Secondary | ICD-10-CM

## 2015-09-28 DIAGNOSIS — G4700 Insomnia, unspecified: Secondary | ICD-10-CM

## 2015-09-28 DIAGNOSIS — M778 Other enthesopathies, not elsewhere classified: Secondary | ICD-10-CM

## 2015-09-28 MED ORDER — ALPRAZOLAM 0.5 MG PO TABS: ORAL_TABLET | ORAL | Status: DC

## 2015-09-28 MED ORDER — NAPROXEN 500 MG PO TABS: 500.0000 mg | ORAL_TABLET | Freq: Two times a day (BID) | ORAL | Status: DC

## 2015-09-28 NOTE — Progress Notes (Signed)
   Subjective:    Patient ID: [name], female    DOB: 1965/06/27, 50 y.o.   MRN: [id number]  HPIMed check up on depression. No problems with meds.   Using chainsaw for the past couple of weeks and thinks she has overused her elbow. Right elbow pain and swelling for 2 weeks. Tried advil, heat, cold and rest.   She states her moods are doing good she does still have insomnia but the Xanax helps with that Seroquel cause her to be too drowsy trazodone didn't help. Melatonin didn't help. She would like to just a quick Xanax at nighttime  Review of Systems See discussion above.    Objective:   Physical Exam   lungs clear heart regular right elbow moderate tenderness with movement in the mid elbow region      Assessment & Plan:   depression anxiety stable uses Xanax sparingly during the week  Insomnia tried Seroquel try trazodone either help tried melatonin this did not help therefore patient using Xanax 2 at bedtime this is permissible does not cause daytime drowsiness  Right elbow pain from overusage anti-inflammatory next couple weeks minimize usage follow-up if ongoing troubles referral to orthopedics if on

## 2015-09-29 ENCOUNTER — Ambulatory Visit (INDEPENDENT_AMBULATORY_CARE_PROVIDER_SITE_OTHER): Payer: Self-pay | Admitting: Psychology

## 2015-09-29 DIAGNOSIS — F331 Major depressive disorder, recurrent, moderate: Secondary | ICD-10-CM

## 2015-10-28 ENCOUNTER — Encounter: Payer: Self-pay | Admitting: Family Medicine

## 2015-10-28 ENCOUNTER — Ambulatory Visit (INDEPENDENT_AMBULATORY_CARE_PROVIDER_SITE_OTHER): Payer: Self-pay | Admitting: Family Medicine

## 2015-10-28 VITALS — Ht 67.0 in | Wt 99.0 lb

## 2015-10-28 DIAGNOSIS — R300 Dysuria: Secondary | ICD-10-CM

## 2015-10-28 DIAGNOSIS — Z113 Encounter for screening for infections with a predominantly sexual mode of transmission: Secondary | ICD-10-CM

## 2015-10-28 LAB — POCT URINALYSIS DIPSTICK
PH UA: 6
Spec Grav, UA: 1.015

## 2015-10-28 MED ORDER — METRONIDAZOLE 500 MG PO TABS: 500.0000 mg | ORAL_TABLET | Freq: Three times a day (TID) | ORAL | Status: DC

## 2015-10-28 NOTE — Progress Notes (Signed)
   Subjective:    Patient ID: [name], female    DOB: 27-Feb-1966, 50 y.o.   MRN: [id number]  HPI  Patient arrives with c/o odor with buring since intercourse 5 days ago. Relates odor vaginal relates burning and discomfort denies high fever chills or abdominal pain PMH benign Review of Systems    see above denies vomiting diarrhea hematuria or vaginal bleeding Objective:   Physical Exam  Lungs clear heart regular abdomen soft vaginal exam minimal discharge no tenderness no masses      Assessment & Plan:  STI-we will screen for this with GC chlamydia VDRL and HIV antibody no obvious infection on exam Bacterial vaginitis Flagyl 3 times a day for 7 days Follow-up if progressive troubles

## 2015-10-29 LAB — RPR: RPR: NONREACTIVE

## 2015-10-29 LAB — HIV ANTIBODY (ROUTINE TESTING W REFLEX): HIV SCREEN 4TH GENERATION: NONREACTIVE

## 2015-10-30 LAB — GC/CHLAMYDIA PROBE AMP
Chlamydia trachomatis, NAA: NEGATIVE
NEISSERIA GONORRHOEAE BY PCR: NEGATIVE

## 2015-11-19 ENCOUNTER — Ambulatory Visit (HOSPITAL_COMMUNITY): Payer: Self-pay | Admitting: Psychology

## 2015-11-20 ENCOUNTER — Ambulatory Visit: Payer: Self-pay | Admitting: Family Medicine

## 2015-11-24 ENCOUNTER — Encounter (HOSPITAL_COMMUNITY): Payer: Self-pay | Admitting: Psychology

## 2015-12-09 ENCOUNTER — Encounter (HOSPITAL_COMMUNITY): Payer: Self-pay | Admitting: Psychology

## 2015-12-09 ENCOUNTER — Ambulatory Visit (INDEPENDENT_AMBULATORY_CARE_PROVIDER_SITE_OTHER): Payer: Self-pay | Admitting: Psychology

## 2015-12-09 DIAGNOSIS — F331 Major depressive disorder, recurrent, moderate: Secondary | ICD-10-CM

## 2015-12-09 NOTE — Progress Notes (Signed)
Patient:  SALEM Pacheco   DOB: 1965-06-11  MR Number: [id number]  Location: BEHAVIORAL [facility] [address] [place] [address] Dept: [phone]  Start: 8 AM End: 9 AM  Provider/Observer:     Hershal Coria PSYD  Chief Complaint:      Chief Complaint  Patient presents with  . Anxiety  . Depression  . Stress    Reason For Service:     The patient returned after being seen here many years ago. Review of my medical records show that I last saw her 2007 because of significant major depressive disorder with major stressors in her family related to conflicts between her and her husband. She has since divorced this husband and had been doing fairly well. However, she recently remarried and he situation with her new husband is not going very well. He also is attempting to make efforts to control her even though this was not something that he appear to be doing when they were dating. The patient has not been coping very well at this and symptoms of depression have returned worsened.  I have not seen her for some time again (2 years) and she returns reporting that she has had a lot of stress recently primarily around her family. She has been losing weight and has had a number of immune system issues including having an outbreak of shingles. The patient reports that she is down under 100 pounds now. She reports that her depression has continued to be significant. She reports that she has been getting along with her husband again.   Interventions Strategy:  Cognitive/behavioral psychotherapeutic interventions  Participation Level:   Active  Participation Quality:  Appropriate      Behavioral Observation:  Well Groomed, Alert, and Appropriate.   Current Psychosocial Factors: The patient reports that she has been doing very poorly and has had a lot of trouble with being able to work many days.  She had had  increases in depression, stress with work, and issues with her sister.  She had battled with soon to be ex-husband over money issues as well.  Content of Session:   Review current symptoms and continued work on therapeutic interventions for recurrent major depressive events.  Current Status:   The patient reports that has been continuing to worsen.  While it is typical to get worse with Fall this is earlier and worse and is also related to real and difficult stressors.  She has looked for a new job but depress has be very difficult and she can't even keep up with her current work.  Patient Progress:   The patient has had a recent deterioration of her overall functioning with marital conflicts but she is starting to improve and move on to adjusting.  Target Goals:   Target goals include reducing the intensity, duration, and severity of depressive events including symptoms of helplessness and hopelessness, anhedonia, and social isolation and withdrawal.  Last Reviewed:   12/09/2015  Goals Addressed Today:    Today we worked on coping skills are in issues of recurrent depression particular in the issue of the intensity of symptoms.  Impression/Diagnosis:  The patient has a long history of recurrent depressive symptoms major depressive disorder. Current stressors right now have to do with her new marriage and the patient is having difficulty coping with this. She returns to this office due to this issue.   Diagnosis:    Axis I: Major depressive  disorder, recurrent episode, moderate (HCC)      Axis II: Deferred

## 2015-12-15 ENCOUNTER — Ambulatory Visit (INDEPENDENT_AMBULATORY_CARE_PROVIDER_SITE_OTHER): Payer: Self-pay | Admitting: Family Medicine

## 2015-12-15 ENCOUNTER — Encounter: Payer: Self-pay | Admitting: Family Medicine

## 2015-12-15 VITALS — BP 100/62 | Ht 67.0 in | Wt 101.2 lb

## 2015-12-15 DIAGNOSIS — F320 Major depressive disorder, single episode, mild: Secondary | ICD-10-CM

## 2015-12-15 DIAGNOSIS — I7300 Raynaud's syndrome without gangrene: Secondary | ICD-10-CM

## 2015-12-15 MED ORDER — DULOXETINE HCL 30 MG PO CPEP: ORAL_CAPSULE | ORAL | 4 refills | Status: DC

## 2015-12-15 NOTE — Progress Notes (Signed)
   Subjective:    Patient ID: [name], female    DOB: [birth year], 50 y.o.   MRN: [id number]  HPI Patient arrives for a follow up on depression.  Patient would like to discuss upping her cymbalta. Patient denies being suicidal. States she just finds herself feeling stressed and depressed she is been this way for many years. She has cycled through different medications. She is never been able to get improvement to the point of being able to feel good from week to week. She does see a psychologist on a regular basis. Her depression is severe enough to where it interferes with her day-to-day activities and focus. She is not minimal to work full-time because of this.  Patient also having continued right elbow pain and numbness. Patient relates soreness in the elbow doing better but now she is having numbness into the hand and she states her middle finger about white once she denies any other particular trouble. She does smoke.   Review of Systems    patient relates numbness form into the hand relates whiteness in her middle finger denies shortness of breath cough fever. Objective:   Physical Exam Subjective numbness that goes from the elbow into the hand also no cyanosis noted. No weakness in the arm. Reflexes are good. Pulse radial pulse good. Radial and ulnar artery were compressed then the ulnar artery was released and there appeared to be adequate refilling of the capillary bed in the hand.  No whiteness no cyanosis     Assessment & Plan:  Probable carpal tunnel-I recommend nerve conduction studies in the specialists visit. Patient defers currently she is trying to get Medicaid if she can get Medicaid [facility] [place] offered [facility] [place] [facility]  Possible Rayn[place] phenomena-I encouraged patient to quit smoking if any cyanosis issues immediately go to ER. If it becomes more persistent or worse to follow-up  Depression patient not suicidal but stressed. Increase  Cymbalta to 90 mg if not seeing improvement within the next 30 days I recommend follow-up with psychiatry. Patient states she sees psychologist here in town and she will have him set her up with psychiatry if 90 mg is not helping her  Follow-up in 3 months time

## 2015-12-15 NOTE — Patient Instructions (Signed)
It is best to try to quit smoking  Please let us know within 60 days whether or not to get nerve conduction studies in [place] or [place]  Follow-up within 90 days on your depression

## 2016-01-04 ENCOUNTER — Ambulatory Visit (INDEPENDENT_AMBULATORY_CARE_PROVIDER_SITE_OTHER): Payer: Self-pay | Admitting: Psychology

## 2016-01-04 DIAGNOSIS — F331 Major depressive disorder, recurrent, moderate: Secondary | ICD-10-CM

## 2016-01-11 ENCOUNTER — Encounter (HOSPITAL_COMMUNITY): Payer: Self-pay | Admitting: Psychology

## 2016-01-11 NOTE — Progress Notes (Signed)
Patient:  [name] H Raboin   DOB: [birth year]  MR Number: [id number]  [facility] [address] [place] [address] Dept: [phone]  Start: 8 AM End: 9 AM  Provider/Observer:     Hershal CoriaJohn R Monquie Fulgham PSYD  Chief Complaint:      Chief Complaint  Patient presents with  . Anxiety  . Depression  . Stress  . Trauma    Reason For Service:     The patient returned after being seen here many years ago. Review of my medical records show that I last saw her 2007 because of significant major depressive disorder with major stressors in her family related to conflicts between her and her husband. She has since divorced this husband and had been doing fairly well. However, she recently remarried and he situation with her new husband is not going very well. He also is attempting to make efforts to control her even though this was not something that he appear to be doing when they were dating. The patient has not been coping very well at this and symptoms of depression have returned worsened.  I have not seen her for some time again (2 years) and she returns reporting that she has had a lot of stress recently primarily around her family. She has been losing weight and has had a number of immune system issues including having an outbreak of shingles. The patient reports that she is down under 100 pounds now. She reports that her depression has continued to be significant. She reports that she has been getting along with her husband again.   Interventions Strategy:  Cognitive/behavioral psychotherapeutic interventions  Participation Level:   Active  Participation Quality:  Appropriate      Behavioral Observation:  Well Groomed, Alert, and Appropriate.   Current Psychosocial Factors: The patient reports that she has been doing very poorly and has had a lot of trouble with being able to work many days.  She had  had increases in depression, stress with work, and issues with her sister.  She had battled with soon to be ex-husband over money issues as well.  Content of Session:   Review current symptoms and continued work on therapeutic interventions for recurrent major depressive events.  Current Status:   The patient reports that has been continuing to worsen.  While it is typical to get worse with Fall this is earlier and worse and is also related to real and difficult stressors.  She has looked for a new job but depress has be very difficult and she can't even keep up with her current work.  Patient Progress:   The patient has had a recent deterioration of her overall functioning with marital conflicts but she is starting to improve and move on to adjusting.  Target Goals:   Target goals include reducing the intensity, duration, and severity of depressive events including symptoms of helplessness and hopelessness, anhedonia, and social isolation and withdrawal.  Last Reviewed:   01/04/2016  Goals Addressed Today:    Today we worked on coping skills are in issues of recurrent depression particular in the issue of the intensity of symptoms.  Impression/Diagnosis:  The patient has a long history of recurrent depressive symptoms major depressive disorder. Current stressors right now have to do with her new marriage and the patient is having difficulty coping with this. She returns to this office due to this issue.   Diagnosis:    Axis  I: Major depressive disorder, recurrent episode, moderate (HCC)      Axis II: Deferred

## 2016-01-21 ENCOUNTER — Ambulatory Visit (INDEPENDENT_AMBULATORY_CARE_PROVIDER_SITE_OTHER): Payer: Self-pay | Admitting: Psychology

## 2016-01-21 ENCOUNTER — Encounter (HOSPITAL_COMMUNITY): Payer: Self-pay | Admitting: Psychology

## 2016-01-21 DIAGNOSIS — F331 Major depressive disorder, recurrent, moderate: Secondary | ICD-10-CM

## 2016-01-21 NOTE — Progress Notes (Signed)
Patient:  [name]   DOB: June 10, 1965  MR Number: [id number]  Location: [facility]AL [facility]-[place] [address] [place] [address] Dept: [phone]  Start: 10 AM End: 11 AM  Provider/Observer:     Hershal CoriaJohn R Rodenbough PSYD  Chief Complaint:      Chief Complaint  Patient presents with  . Anxiety  . Depression  . Stress  . Trauma    Reason For Service:     The patient returned after being seen here many years ago. Review of my medical records show that I last saw her 2007 because of significant major depressive disorder with major stressors in her family related to conflicts between her and her husband. She has since divorced this husband and had been doing fairly well. However, she recently remarried and he situation with her new husband is not going very well. He also is attempting to make efforts to control her even though this was not something that he appear to be doing when they were dating. The patient has not been coping very well at this and symptoms of depression have returned worsened.  I have not seen her for some time again (2 years) and she returns reporting that she has had a lot of stress recently primarily around her family. She has been losing weight and has had a number of immune system issues including having an outbreak of shingles. The patient reports that she is down under 100 pounds now. She reports that her depression has continued to be significant. She reports that she has been getting along with her husband again.   Interventions Strategy:  Cognitive/behavioral psychotherapeutic interventions  Participation Level:   Active  Participation Quality:  Appropriate      Behavioral Observation:  Well Groomed, Alert, and Appropriate.   Current Psychosocial Factors: The patient reports that her friend needed bypass surgery and he and his family expected her to help him when he got home.   However, patient and his son started being very very demanding of her and the son acted agressively towards her after they expected her to stay all the time when she could not.  This overwhelmed her, but she was able to take up for herself (rare event for her) and pull away from the situation.  Content of Session:   Review current symptoms and continued work on therapeutic interventions for recurrent major depressive events.  Current Status:   The patient reports that she had been doing more and more poorly up until a few days ago.  She was able to stand up for herself and has been feeling a lot better past two days.  Patient Progress:   The patient has had a recent deterioration of her overall functioning with marital conflicts but she is starting to improve and move on to adjusting.  Target Goals:   Target goals include reducing the intensity, duration, and severity of depressive events including symptoms of helplessness and hopelessness, anhedonia, and social isolation and withdrawal.  Last Reviewed:   01/21/2016  Goals Addressed Today:    Today we worked on coping skills are in issues of recurrent depression particular in the issue of the intensity of symptoms.  Impression/Diagnosis:  The patient has a long history of recurrent depressive symptoms major depressive disorder. Current stressors right now have to do with her new marriage and the patient is having difficulty coping with this. She returns to this office due to this issue.  Diagnosis:    Axis I: Major depressive disorder, recurrent episode, moderate (HCC)      Axis II: Deferred

## 2016-02-10 NOTE — Progress Notes (Signed)
Patient:  [name]   DOB: [birth year]  MR Number: [id number]  [facility]- [address] [place] [address] Dept: [phone]  Start: 1 PM End: 2 PM  Provider/Observer:     Hershal CoriaJohn R Andria Head PSYD  Chief Complaint:      Chief Complaint  Patient presents with  . Anxiety  . Depression  . Stress    Reason For Service:     The patient returned after being seen here many years ago. Review of my medical records show that I last saw her 2007 because of significant major depressive disorder with major stressors in her family related to conflicts between her and her husband. She has since divorced this husband and had been doing fairly well. However, she recently remarried and he situation with her new husband is not going very well. He also is attempting to make efforts to control her even though this was not something that he appear to be doing when they were dating. The patient has not been coping very well at this and symptoms of depression have returned worsened.  I have not seen her for some time again (2 years) and she returns reporting that she has had a lot of stress recently primarily around her family. She has been losing weight and has had a number of immune system issues including having an outbreak of shingles. The patient reports that she is down under 100 pounds now. She reports that her depression has continued to be significant. She reports that she has been getting along with her husband again.   Interventions Strategy:  Cognitive/behavioral psychotherapeutic interventions  Participation Level:   Active  Participation Quality:  Appropriate      Behavioral Observation:  Well Groomed, Alert, and Appropriate.   Current Psychosocial Factors: The patient reports that her husband has left for [place] and it looks like the marriage is over.  While this initially created a lot of  stress she has adjusted and is more stable now.  Worked on developing a plan for coping.  Content of Session:   Review current symptoms and continued work on therapeutic interventions for recurrent major depressive events.  Current Status:   The patient reports that the recent increase in her depression has improved somewhat and that she is working on her coping skills and applying them in a helpful way  Patient Progress:   The patient has had a recent deterioration of her overall functioning with marital conflicts but she is starting to improve and move on to adjusting.  Target Goals:   Target goals include reducing the intensity, duration, and severity of depressive events including symptoms of helplessness and hopelessness, anhedonia, and social isolation and withdrawal.  Last Reviewed:   09/29/2015  Goals Addressed Today:    Today we worked on coping skills are in issues of recurrent depression particular in the issue of the intensity of symptoms.  Impression/Diagnosis:  The patient has a long history of recurrent depressive symptoms major depressive disorder. Current stressors right now have to do with her new marriage and the patient is having difficulty coping with this. She returns to this office due to this issue.   Diagnosis:    Axis I: Major depressive disorder, recurrent episode, moderate (HCC)      Axis II: Deferred

## 2016-02-18 DIAGNOSIS — Z0271 Encounter for disability determination: Secondary | ICD-10-CM

## 2016-02-19 DIAGNOSIS — Z0289 Encounter for other administrative examinations: Secondary | ICD-10-CM

## 2016-02-22 ENCOUNTER — Ambulatory Visit (INDEPENDENT_AMBULATORY_CARE_PROVIDER_SITE_OTHER): Payer: Self-pay | Admitting: Psychology

## 2016-02-22 ENCOUNTER — Encounter (HOSPITAL_COMMUNITY): Payer: Self-pay | Admitting: Psychology

## 2016-02-22 DIAGNOSIS — F331 Major depressive disorder, recurrent, moderate: Secondary | ICD-10-CM

## 2016-02-22 NOTE — Progress Notes (Signed)
Patient:  [name]   DOB: 1965/11/19  MR Number: [id number]  Location: [facility] [address] [place] [address] Dept: [phone]  Start: 10 AM End: 11 AM  Provider/Observer:     Hershal CoriaJohn R Jaimeson Gopal PSYD  Chief Complaint:      Chief Complaint  Patient presents with  . Anxiety  . Depression  . Stress  . Trauma    Reason For Service:     The patient returned after being seen here many years ago. Review of my medical records show that I last saw her 2007 because of significant major depressive disorder with major stressors in her family related to conflicts between her and her husband. She has since divorced this husband and had been doing fairly well. However, she recently remarried and he situation with her new husband is not going very well. He also is attempting to make efforts to control her even though this was not something that he appear to be doing when they were dating. The patient has not been coping very well at this and symptoms of depression have returned worsened.  I have not seen her for some time again (2 years) and she returns reporting that she has had a lot of stress recently primarily around her family. She has been losing weight and has had a number of immune system issues including having an outbreak of shingles. The patient reports that she is down under 100 pounds now. She reports that her depression has continued to be significant. She reports that she has been getting along with her husband again.   Interventions Strategy:  Cognitive/behavioral psychotherapeutic interventions  Participation Level:   Active  Participation Quality:  Appropriate      Behavioral Observation:  Well Groomed, Alert, and Appropriate.   Current Psychosocial Factors: The patient reports that she has contineud to do poorly.  She reports that things with friend remain stressful.  She is still  apart from Husband and there is little to no chance that they will get back together.  Content of Session:   Review current symptoms and continued work on therapeutic interventions for recurrent major depressive events.  Current Status:   The patient reports that she had done better at times with depression, but other times she is unable to function and cope.  Past stress and Trauma cause issues.  Patient Progress:   The patient has had a recent deterioration of her overall functioning with marital conflicts but she is starting to improve and move on to adjusting.  Target Goals:   Target goals include reducing the intensity, duration, and severity of depressive events including symptoms of helplessness and hopelessness, anhedonia, and social isolation and withdrawal.  Last Reviewed:   02/22/2016  Goals Addressed Today:    Today we worked on coping skills are in issues of recurrent depression particular in the issue of the intensity of symptoms.  Impression/Diagnosis:  The patient has a long history of recurrent depressive symptoms major depressive disorder. Current stressors right now have to do with ending of her marriage and stress with the little support system she has.  Family is expecting patient to help them but not giving help back.  This has been true for years.   Diagnosis:    Axis I: Major depressive disorder, recurrent episode, moderate (HCC)      Axis II: Deferred

## 2016-03-09 ENCOUNTER — Encounter: Payer: Self-pay | Admitting: Nurse Practitioner

## 2016-03-09 ENCOUNTER — Ambulatory Visit (INDEPENDENT_AMBULATORY_CARE_PROVIDER_SITE_OTHER): Payer: Self-pay | Admitting: Nurse Practitioner

## 2016-03-09 VITALS — BP 100/70 | Ht 67.0 in | Wt 99.5 lb

## 2016-03-09 DIAGNOSIS — F419 Anxiety disorder, unspecified: Secondary | ICD-10-CM

## 2016-03-09 DIAGNOSIS — F3289 Other specified depressive episodes: Secondary | ICD-10-CM

## 2016-03-09 MED ORDER — DULOXETINE HCL 60 MG PO CPEP: 60.0000 mg | ORAL_CAPSULE | Freq: Every day | ORAL | 5 refills | Status: DC

## 2016-03-09 MED ORDER — BUPROPION HCL ER (SR) 150 MG PO TB12: 150.0000 mg | ORAL_TABLET | Freq: Two times a day (BID) | ORAL | 5 refills | Status: DC

## 2016-03-09 MED ORDER — ALPRAZOLAM 1 MG PO TABS: ORAL_TABLET | ORAL | 5 refills | Status: DC

## 2016-03-09 NOTE — Progress Notes (Signed)
Subjective:  Presents for recheck of depression and anxiety. Still seeing her counselor. Has cut back her Cymbalta to 60 mg. Doing better on current dose except for fatigue. Would like to try Wellbutrin again for depression as well as smoking cessation. Takes 2 0.5 mg Xanax at bedtime which helps sleep. Takes occasional one during the day. Rare panic attack.   Objective:   BP 100/70   Ht 5\' 7"  (1.702 m)   Wt 99 lb 8 oz (45.1 kg)   BMI 15.58 kg/m  NAD. Alert, oriented. Thoughts logical, coherent and relevant. Dressed appropriately. Makes good eye contact. Lungs clear. Heart RRR.   Assessment:  Problem List Items Addressed This Visit      Other   Anxiety - Primary   Relevant Medications   DULoxetine (CYMBALTA) 60 MG capsule   buPROPion (WELLBUTRIN SR) 150 MG 12 hr tablet   ALPRAZolam (XANAX) 1 MG tablet   Depression   Relevant Medications   DULoxetine (CYMBALTA) 60 MG capsule   buPROPion (WELLBUTRIN SR) 150 MG 12 hr tablet   ALPRAZolam (XANAX) 1 MG tablet     Plan:  Meds ordered this encounter  Medications  . DULoxetine (CYMBALTA) 60 MG capsule    Sig: Take 1 capsule (60 mg total) by mouth daily.    Dispense:  30 capsule    Refill:  5    Order Specific Question:   Supervising Provider    Answer:   Merlyn AlbertLUKING, WILLIAM S [[id number]]  . buPROPion (WELLBUTRIN SR) 150 MG 12 hr tablet    Sig: Take 1 tablet (150 mg total) by mouth 2 (two) times daily.    Dispense:  60 tablet    Refill:  5    Order Specific Question:   Supervising Provider    Answer:   Merlyn AlbertLUKING, WILLIAM S [[id number]]  . ALPRAZolam (XANAX) 1 MG tablet    Sig: Take 1/2 tab po BID during the day and one po qhs prn    Dispense:  60 tablet    Refill:  5    May refill monthly    Order Specific Question:   Supervising Provider    Answer:   Merlyn AlbertLUKING, WILLIAM S [[id number]]   Start Wellbutrin once in the morning then if needed, take a second dose about 8 hours later.  Stop med if any problems.  Return in about 6 months (around 09/06/2016) for  recheck. Cannot afford physical due to lack of insurance.

## 2016-03-15 ENCOUNTER — Ambulatory Visit: Payer: Self-pay | Admitting: Family Medicine

## 2016-04-06 ENCOUNTER — Ambulatory Visit (INDEPENDENT_AMBULATORY_CARE_PROVIDER_SITE_OTHER): Payer: Self-pay | Admitting: Psychology

## 2016-04-06 DIAGNOSIS — F331 Major depressive disorder, recurrent, moderate: Secondary | ICD-10-CM

## 2016-04-06 NOTE — Progress Notes (Signed)
Patient:  [name]   DOB: 1965/09/10  MR Number: [id number]  Location: [facility] [address] [place] [address] Dept: [phone]  Start: 3 PM End: 4 PM  Provider/Observer:     Hershal CoriaJohn R Rodenbough PSYD  Chief Complaint:      Chief Complaint  Patient presents with  . Anxiety  . Depression  . Stress  . Trauma    Reason For Service:     The patient returned after being seen here many years ago. Review of my medical records show that I last saw her 2007 because of significant major depressive disorder with major stressors in her family related to conflicts between her and her husband. She has since divorced this husband and had been doing fairly well. However, she recently remarried and he situation with her new husband is not going very well. He also is attempting to make efforts to control her even though this was not something that he appear to be doing when they were dating. The patient has not been coping very well at this and symptoms of depression have returned worsened.  I have not seen her for some time again (2 years) and she returns reporting that she has had a lot of stress recently primarily around her family. She has been losing weight and has had a number of immune system issues including having an outbreak of shingles. The patient reports that she is down under 100 pounds now. She reports that her depression has continued to be significant. She reports that she has been getting along with her husband again.   Interventions Strategy:  Cognitive/behavioral psychotherapeutic interventions  Participation Level:   Active  Participation Quality:  Appropriate      Behavioral Observation:  Well Groomed, Alert, and Appropriate.   Current Psychosocial Factors: The patient reports that she has contineud to do poorly.  She reports that things with friend remain stressful.  She is still  apart from Husband and there is little to no chance that they will get back together.  Content of Session:   Review current symptoms and continued work on therapeutic interventions for recurrent major depressive events.  Current Status:   The patient reports that she had done better at times with depression, but other times she is unable to function and cope.  Past stress and Trauma cause issues.  Patient Progress:   The patient has had a recent deterioration of her overall functioning with marital conflicts but she is starting to improve and move on to adjusting.  Target Goals:   Target goals include reducing the intensity, duration, and severity of depressive events including symptoms of helplessness and hopelessness, anhedonia, and social isolation and withdrawal.  Last Reviewed:   04/06/2016  Goals Addressed Today:    Today we worked on coping skills are in issues of recurrent depression particular in the issue of the intensity of symptoms.  Impression/Diagnosis:  The patient has a long history of recurrent depressive symptoms major depressive disorder. Current stressors right now have to do with ending of her marriage and stress with the little support system she has.  Family is expecting patient to help them but not giving help back.  This has been true for years.   Diagnosis:    Axis I: Major depressive disorder, recurrent episode, moderate (HCC)      Axis II: Deferred

## 2016-04-14 ENCOUNTER — Telehealth: Payer: Self-pay | Admitting: *Deleted

## 2016-04-14 NOTE — Telephone Encounter (Signed)
Spoke with patient and rescheduled her follow up due to provider being OOO. She verbalized understanding.

## 2016-04-22 ENCOUNTER — Telehealth: Payer: Self-pay | Admitting: Family Medicine

## 2016-04-22 ENCOUNTER — Other Ambulatory Visit: Payer: Self-pay | Admitting: Nurse Practitioner

## 2016-04-22 MED ORDER — BUPROPION HCL ER (SR) 150 MG PO TB12: 150.0000 mg | ORAL_TABLET | Freq: Two times a day (BID) | ORAL | 5 refills | Status: DC

## 2016-04-22 NOTE — Telephone Encounter (Signed)
Even though Wellbutrin has not helped smoking, it is great for other things such as depression. If she would like to continue or change the dose, let me know.

## 2016-04-22 NOTE — Telephone Encounter (Signed)
Patient states she is still smoking while taking wellbutrin. She admits to 1pk/4d.  She states the wellbutrin has helped her with depression and would like to know if she should continue taking it.  Patient states she is fine today, but appears to be crying during our conversation. I advised her I would send you a message for recommendation and one of us would call her back. Seh voiced understanding and agreed with plan.

## 2016-04-22 NOTE — Telephone Encounter (Signed)
Patient wanting to know if she can continue taking Wellbutrin 150 and she hadnt stopped smoking still smoking some

## 2016-04-22 NOTE — Telephone Encounter (Signed)
Patient states she would like to continue medication at same dose. I advised her to call back if something changes.

## 2016-04-22 NOTE — Telephone Encounter (Signed)
noted 

## 2016-08-24 ENCOUNTER — Ambulatory Visit: Payer: Self-pay | Admitting: Diagnostic Neuroimaging

## 2016-09-13 ENCOUNTER — Ambulatory Visit (INDEPENDENT_AMBULATORY_CARE_PROVIDER_SITE_OTHER): Payer: Self-pay | Admitting: Family Medicine

## 2016-09-13 ENCOUNTER — Encounter: Payer: Self-pay | Admitting: Family Medicine

## 2016-09-13 VITALS — BP 124/82 | Temp 98.4°F | Ht 67.0 in | Wt 99.5 lb

## 2016-09-13 DIAGNOSIS — H7292 Unspecified perforation of tympanic membrane, left ear: Secondary | ICD-10-CM

## 2016-09-13 DIAGNOSIS — H6592 Unspecified nonsuppurative otitis media, left ear: Secondary | ICD-10-CM

## 2016-09-13 MED ORDER — CEPHALEXIN 500 MG PO CAPS: 500.0000 mg | ORAL_CAPSULE | Freq: Three times a day (TID) | ORAL | 0 refills | Status: DC

## 2016-09-13 NOTE — Progress Notes (Signed)
   Subjective:    Patient ID: [name] H Game, female    DOB: 06/03/1965, 51 y.o.   MRN: [id number]  Otalgia   There is pain in the left ear. This is a new problem. The current episode started today.   Patient today for possible busted ear drum to left ear. Patient states that she blew her nose this morning and she felt a pop and then noticed blood coming out of her ear.  Pt was blowing her nose and felt air conoing up thru ear, then bk Led  No major troule over the ra   Some recent allergy  States no other concerns this visit.   Review of Systems  HENT: Positive for ear pain.        Objective:   Physical Exam Alert vitals stable, NAD. Blood pressure good on repeat. HEENTLeft TM blood evident external canal otherwise normal. Lungs clear. Heart regular rate and rhythm.        Assessment & Plan:  Impression probable tympanic membrane rupture with increased pressure with blowing nose plan add Keflex 500 3 times a day 10 days. Symptom care discussed. Expect gradual resolution/follow-up of persists

## 2016-09-30 ENCOUNTER — Other Ambulatory Visit: Payer: Self-pay | Admitting: Diagnostic Neuroimaging

## 2016-09-30 ENCOUNTER — Other Ambulatory Visit: Payer: Self-pay | Admitting: Nurse Practitioner

## 2016-09-30 ENCOUNTER — Other Ambulatory Visit: Payer: Self-pay | Admitting: Family Medicine

## 2016-10-24 ENCOUNTER — Encounter: Payer: Self-pay | Admitting: Family Medicine

## 2016-10-24 ENCOUNTER — Ambulatory Visit (INDEPENDENT_AMBULATORY_CARE_PROVIDER_SITE_OTHER): Payer: Self-pay | Admitting: Family Medicine

## 2016-10-24 VITALS — BP 98/60 | Ht 67.0 in | Wt 97.6 lb

## 2016-10-24 DIAGNOSIS — F419 Anxiety disorder, unspecified: Secondary | ICD-10-CM

## 2016-10-24 MED ORDER — LEVETIRACETAM 500 MG PO TABS: ORAL_TABLET | ORAL | 5 refills | Status: DC

## 2016-10-24 MED ORDER — DULOXETINE HCL 60 MG PO CPEP: 60.0000 mg | ORAL_CAPSULE | Freq: Every day | ORAL | 5 refills | Status: DC

## 2016-10-24 MED ORDER — ALPRAZOLAM 1 MG PO TABS: ORAL_TABLET | ORAL | 5 refills | Status: DC

## 2016-10-24 NOTE — Progress Notes (Signed)
   Subjective:    Patient ID: [name], female    DOB: 04-Dec-1965, 51 y.o.   MRN: [id number]  Anxiety  Presents for follow-up visit. Patient reports no chest pain or shortness of breath.    Med check up on anxiety and depression. No concerns or problems.  Patient denies any seizures She states compliance with medicine She still smokes she know she needs to quit She denies any worsening of depression states anxiety about the same denies abusing medication Pt wants to get refill on keppra that is prescribed by neurologist. She does not have insurance.     Review of Systems  Constitutional: Negative for activity change, fatigue and fever.  Respiratory: Negative for cough and shortness of breath.   Cardiovascular: Negative for chest pain and leg swelling.  Neurological: Negative for headaches.       Objective:   Physical Exam  Constitutional: She appears well-nourished. No distress.  Cardiovascular: Normal rate, regular rhythm and normal heart sounds.   No murmur heard. Pulmonary/Chest: Effort normal and breath sounds normal. No respiratory distress.  Musculoskeletal: She exhibits no edema.  Lymphadenopathy:    She has no cervical adenopathy.  Neurological: She is alert. She exhibits normal muscle tone.  Psychiatric: Her behavior is normal.  Vitals reviewed.         Assessment & Plan:  Seizures none refills given follow-up in 6 months  Generalized anxiety with depression-continue Cymbalta continue Xanax follow-up if progressive troubles or if worse not suicidal. Recheck 6 months

## 2016-10-26 ENCOUNTER — Other Ambulatory Visit: Payer: Self-pay | Admitting: Family Medicine

## 2016-10-27 ENCOUNTER — Other Ambulatory Visit: Payer: Self-pay | Admitting: Family Medicine

## 2016-12-02 ENCOUNTER — Ambulatory Visit: Payer: Self-pay | Admitting: Family Medicine

## 2017-04-06 ENCOUNTER — Ambulatory Visit: Payer: Self-pay | Admitting: Nurse Practitioner

## 2017-04-06 ENCOUNTER — Encounter: Payer: Self-pay | Admitting: Nurse Practitioner

## 2017-04-06 VITALS — BP 110/74 | Temp 98.7°F | Ht 67.0 in | Wt 106.0 lb

## 2017-04-06 DIAGNOSIS — K582 Mixed irritable bowel syndrome: Secondary | ICD-10-CM

## 2017-04-06 DIAGNOSIS — K219 Gastro-esophageal reflux disease without esophagitis: Secondary | ICD-10-CM

## 2017-04-06 DIAGNOSIS — S39011A Strain of muscle, fascia and tendon of abdomen, initial encounter: Secondary | ICD-10-CM

## 2017-04-06 NOTE — Progress Notes (Signed)
Subjective: Presents for complaints of mid abdominal pain near the umbilical area that started about a week ago after raking leaves and pulling on a heavy tarp.  Has improved over time.  Describes as a bruised feeling.  Slightly worse after eating, feels a bulging pain.  Slightly worse with certain movements.  Sleeping without difficulty at this point.  Has had belching and increased gas production.  No vomiting.  No obvious acid reflux.  Has had chronic nausea, no change.  Discomfort is better with heat applications are being very still.  Has alternating cycles of constipation and diarrhea.  Rare anti-inflammatory use.  He drinks a large amount of caffeine.  Smoker but is trying to stop.  No fever.  No shortness of breath.  Objective:   BP 110/74   Temp 98.7 F (37.1 C) (Oral)   Ht 5\' 7"  (1.702 m)   Wt 106 lb (48.1 kg)   BMI 16.60 kg/m  NAD.  Alert, oriented.  Mildly anxious affect.  Lungs clear.  Heart regular rate and rhythm.  Abdomen soft nondistended with minimal bilateral mid abdominal pain.  No masses rebound or guarding noted.  Also mild epigastric area tenderness.  Assessment:   Problem List Items Addressed This Visit      Digestive   Gastroesophageal reflux disease without esophagitis - Primary   Irritable bowel syndrome with both constipation and diarrhea   Relevant Orders   IFOBT POC (occult bld, rslt in office)    Other Visit Diagnoses    Strain of abdominal muscle, initial encounter           Plan: Start OTC omeprazole daily as directed for the next 2-3 weeks.  Wean off caffeine and reduce tobacco use.  Patient is due for screening colonoscopy and combined with her current symptoms recommend GI referral.  Patient defers for now but agrees to  IFOBT.  Also agrees to see GI specialist if this comes back positive.  Expect gradual resolution of muscle strain.  Discussed lifestyle factors including dietary affecting her GERD and IBS.  Call back if symptoms worsen or persist.

## 2017-04-06 NOTE — Patient Instructions (Addendum)
Take Omeprazole 20 mg once a day for 2-3 weeks  Food Choices for Gastroesophageal Reflux Disease, Adult When you have gastroesophageal reflux disease (GERD), the foods you eat and your eating habits are very important. Choosing the right foods can help ease your discomfort. What guidelines do I need to follow?  Choose fruits, vegetables, whole grains, and low-fat dairy products.  Choose low-fat meat, fish, and poultry.  Limit fats such as oils, salad dressings, butter, nuts, and avocado.  Keep a food diary. This helps you identify foods that cause symptoms.  Avoid foods that cause symptoms. These may be different for everyone.  Eat small meals often instead of 3 large meals a day.  Eat your meals slowly, in a place where you are relaxed.  Limit fried foods.  Cook foods using methods other than frying.  Avoid drinking alcohol.  Avoid drinking large amounts of liquids with your meals.  Avoid bending over or lying down until 2-3 hours after eating. What foods are not recommended? These are some foods and drinks that may make your symptoms worse: Vegetables Tomatoes. Tomato juice. Tomato and spaghetti sauce. Chili peppers. Onion and garlic. Horseradish. Fruits Oranges, grapefruit, and lemon (fruit and juice). Meats High-fat meats, fish, and poultry. This includes hot dogs, ribs, ham, sausage, salami, and bacon. Dairy Whole milk and chocolate milk. Sour cream. Cream. Butter. Ice cream. Cream cheese. Drinks Coffee and tea. Bubbly (carbonated) drinks or energy drinks. Condiments Hot sauce. Barbecue sauce. Sweets/Desserts Chocolate and cocoa. Donuts. Peppermint and spearmint. Fats and Oils High-fat foods. This includes [place] fries and potato chips. Other Vinegar. Strong spices. This includes black pepper, white pepper, red pepper, cayenne, curry powder, cloves, ginger, and chili powder. The items listed above may not be a complete list of foods and drinks to avoid. Contact  your dietitian for more information. This information is not intended to replace advice given to you by your health care provider. Make sure you discuss any questions you have with your health care provider. Document Released: 09/27/2011 Document Revised: 09/03/2015 Document Reviewed: 01/30/2013 Elsevier Interactive Patient Education  2017 [employer]  Irritable Bowel Syndrome, Adult Irritable bowel syndrome (IBS) is not one specific disease. It is a group of symptoms that affects the organs responsible for digestion (gastrointestinal or GI tract). To regulate how your GI tract works, your body sends signals back and forth between your intestines and your brain. If you have IBS, there may be a problem with these signals. As a result, your GI tract does not function normally. Your intestines may become more sensitive and overreact to certain things. This is especially true when you eat certain foods or when you are under stress. There are four types of IBS. These may be determined based on the consistency of your stool:  IBS with diarrhea.  IBS with constipation.  Mixed IBS.  Unsubtyped IBS.  It is important to know which type of IBS you have. Some treatments are more likely to be helpful for certain types of IBS. What are the causes? The exact cause of IBS is not known. What increases the risk? You may have a higher risk of IBS if:  You are a woman.  You are younger than 51 years old.  You have a family history of IBS.  You have mental health problems.  You have had bacterial infection of your GI tract.  What are the signs or symptoms? Symptoms of IBS vary from person to person. The main symptom is abdominal pain or  discomfort. Additional symptoms usually include one or more of the following:  Diarrhea, constipation, or both.  Abdominal swelling or bloating.  Feeling full or sick after eating a small or regular-size meal.  Frequent gas.  Mucus in the stool.  A  feeling of having more stool left after a bowel movement.  Symptoms tend to come and go. They may be associated with stress, psychiatric conditions, or nothing at all. How is this diagnosed? There is no specific test to diagnose IBS. Your health care provider will make a diagnosis based on a physical exam, medical history, and your symptoms. You may have other tests to rule out other conditions that may be causing your symptoms. These may include:  Blood tests.  X-rays.  CT scan.  Endoscopy and colonoscopy. This is a test in which your GI tract is viewed with a long, thin, flexible tube.  How is this treated? There is no cure for IBS, but treatment can help relieve symptoms. IBS treatment often includes:  Changes to your diet, such as: ? Eating more fiber. ? Avoiding foods that cause symptoms. ? Drinking more water. ? Eating regular, medium-sized portioned meals.  Medicines. These may include: ? Fiber supplements if you have constipation. ? Medicine to control diarrhea (antidiarrheal medicines). ? Medicine to help control muscle spasms in your GI tract (antispasmodic medicines). ? Medicines to help with any mental health issues, such as antidepressants or tranquilizers.  Therapy. ? Talk therapy may help with anxiety, depression, or other mental health issues that can make IBS symptoms worse.  Stress reduction. ? Managing your stress can help keep symptoms under control.  Follow these instructions at home:  Take medicines only as directed by your health care provider.  Eat a healthy diet. ? Avoid foods and drinks with added sugar. ? Include more whole grains, fruits, and vegetables gradually into your diet. This may be especially helpful if you have IBS with constipation. ? Avoid any foods and drinks that make your symptoms worse. These may include dairy products and caffeinated or carbonated drinks. ? Do not eat large meals. ? Drink enough fluid to keep your urine clear or  pale yellow.  Exercise regularly. Ask your health care provider for recommendations of good activities for you.  Keep all follow-up visits as directed by your health care provider. This is important. Contact a health care provider if:  You have constant pain.  You have trouble or pain with swallowing.  You have worsening diarrhea. Get help right away if:  You have severe and worsening abdominal pain.  You have diarrhea and: ? You have a rash, stiff neck, or severe headache. ? You are irritable, sleepy, or difficult to awaken. ? You are weak, dizzy, or extremely thirsty.  You have bright red blood in your stool or you have black tarry stools.  You have unusual abdominal swelling that is painful.  You vomit continuously.  You vomit blood (hematemesis).  You have both abdominal pain and a fever. This information is not intended to replace advice given to you by your health care provider. Make sure you discuss any questions you have with your health care provider. Document Released: 03/28/2005 Document Revised: 08/28/2015 Document Reviewed: 12/13/2013 Elsevier Interactive Patient Education  2018 [employer]

## 2017-04-07 ENCOUNTER — Encounter: Payer: Self-pay | Admitting: Nurse Practitioner

## 2017-04-07 DIAGNOSIS — K219 Gastro-esophageal reflux disease without esophagitis: Secondary | ICD-10-CM | POA: Insufficient documentation

## 2017-04-07 DIAGNOSIS — K582 Mixed irritable bowel syndrome: Secondary | ICD-10-CM | POA: Insufficient documentation

## 2017-04-26 ENCOUNTER — Ambulatory Visit: Payer: Self-pay | Admitting: Family Medicine

## 2017-05-11 ENCOUNTER — Ambulatory Visit (INDEPENDENT_AMBULATORY_CARE_PROVIDER_SITE_OTHER): Payer: Self-pay | Admitting: Family Medicine

## 2017-05-11 ENCOUNTER — Encounter: Payer: Self-pay | Admitting: Family Medicine

## 2017-05-11 VITALS — BP 98/66 | HR 82 | Temp 98.6°F | Ht 67.0 in | Wt 102.4 lb

## 2017-05-11 DIAGNOSIS — J449 Chronic obstructive pulmonary disease, unspecified: Secondary | ICD-10-CM

## 2017-05-11 DIAGNOSIS — F321 Major depressive disorder, single episode, moderate: Secondary | ICD-10-CM

## 2017-05-11 DIAGNOSIS — G4700 Insomnia, unspecified: Secondary | ICD-10-CM

## 2017-05-11 MED ORDER — DULOXETINE HCL 60 MG PO CPEP: 60.0000 mg | ORAL_CAPSULE | Freq: Every day | ORAL | 5 refills | Status: DC

## 2017-05-11 MED ORDER — ALPRAZOLAM 1 MG PO TABS: ORAL_TABLET | ORAL | 5 refills | Status: DC

## 2017-05-11 MED ORDER — LEVETIRACETAM 500 MG PO TABS: ORAL_TABLET | ORAL | 5 refills | Status: DC

## 2017-05-11 MED ORDER — ALBUTEROL SULFATE HFA 108 (90 BASE) MCG/ACT IN AERS: 2.0000 | INHALATION_SPRAY | Freq: Four times a day (QID) | RESPIRATORY_TRACT | 2 refills | Status: DC | PRN

## 2017-05-11 MED ORDER — BUPROPION HCL ER (SR) 150 MG PO TB12: 150.0000 mg | ORAL_TABLET | Freq: Two times a day (BID) | ORAL | 4 refills | Status: DC

## 2017-05-11 NOTE — Progress Notes (Signed)
   Subjective:    Patient ID: [name], female    DOB: [birth year], 52 y.o.   MRN: [id number]  Depression         This is a recurrent problem.  Associated symptoms include no fatigue, no headaches and no suicidal ideas.( Depressed over the past few weeks. Wants to start back on wellbutrin to help with depression and to help quit smoking)  pt states she has abdominal pain for a couple of days a few weeks ago. Diagnosed with acid reflux. Pt states she has never had issues with reflux and she wants that diagnosis removed from chart. No longer having any abdominal pain.   Would like rx for inhaler due to sob.  The patient does smoke she knows she needs to quit she has had a pulmonary function test done in the past chest x-ray done in the past does not want further testing currently   Review of Systems  Constitutional: Negative for activity change, fatigue and fever.  HENT: Negative for congestion.   Respiratory: Positive for shortness of breath. Negative for cough and chest tightness.   Cardiovascular: Negative for chest pain and leg swelling.  Gastrointestinal: Negative for abdominal pain.  Skin: Negative for color change.  Neurological: Negative for headaches.  Psychiatric/Behavioral: Positive for depression, dysphoric mood and sleep disturbance. Negative for behavioral problems and suicidal ideas. The patient is nervous/anxious.        Objective:   Physical Exam  Constitutional: She appears well-developed and well-nourished. No distress.  HENT:  Head: Normocephalic and atraumatic.  Eyes: Right eye exhibits no discharge. Left eye exhibits no discharge.  Neck: No tracheal deviation present.  Cardiovascular: Normal rate, regular rhythm and normal heart sounds.  No murmur heard. Pulmonary/Chest: Effort normal and breath sounds normal. No respiratory distress. She has no wheezes. She has no rales.  Musculoskeletal: She exhibits no edema.  Lymphadenopathy:    She has no cervical  adenopathy.  Neurological: She is alert. She exhibits normal muscle tone.  Skin: Skin is warm and dry. No erythema.  Psychiatric: Her behavior is normal.  Vitals reviewed.         Assessment & Plan:  Probable COPD patient needs to quit smoking.  She had a pulmonary function test done several years ago she also had chest x-ray several years back I would not recommend repeating these currently patient does not have insurance and would like to keep cost down in addition to this the patient does state that she is willing to work at quitting smoking with the Wellbutrin  Depression continue Cymbalta but add Wellbutrin recheck in 4 weeks if not seeing significant improvement over the span then may need specialist consultation  Insomnia Xanax as directed only one at nighttime may use a half during the day when necessary to help with anxiety the patient states on her previous visit  It was thought that she had reflux she states she never had reflux symptoms and she would like to have that removed from her problem list this was removed without difficulty  The patient agrees that if she is not seeing significant improvement over the next 4 weeks she will allow us to help set her up with psychiatry her husband is present today he was concerned that she may be having bipolar patient denies any manic symptoms

## 2017-05-30 ENCOUNTER — Ambulatory Visit: Payer: Self-pay | Admitting: Family Medicine

## 2017-05-30 ENCOUNTER — Ambulatory Visit (HOSPITAL_COMMUNITY)
Admission: RE | Admit: 2017-05-30 | Discharge: 2017-05-30 | Disposition: A | Discharge status: Home / Self Care | Payer: Self-pay | Source: Ambulatory Visit | Attending: Family Medicine | Admitting: Family Medicine

## 2017-05-30 ENCOUNTER — Encounter: Payer: Self-pay | Admitting: Family Medicine

## 2017-05-30 ENCOUNTER — Other Ambulatory Visit (HOSPITAL_COMMUNITY)
Admission: RE | Admit: 2017-05-30 | Discharge: 2017-05-30 | Disposition: A | Discharge status: Home / Self Care | Payer: Self-pay | Source: Ambulatory Visit | Attending: Family Medicine | Admitting: Family Medicine

## 2017-05-30 VITALS — BP 122/82 | Temp 98.2°F | Ht 67.0 in | Wt 100.2 lb

## 2017-05-30 DIAGNOSIS — I700 Atherosclerosis of aorta: Secondary | ICD-10-CM | POA: Insufficient documentation

## 2017-05-30 DIAGNOSIS — R1032 Left lower quadrant pain: Secondary | ICD-10-CM | POA: Insufficient documentation

## 2017-05-30 DIAGNOSIS — E7849 Other hyperlipidemia: Secondary | ICD-10-CM

## 2017-05-30 LAB — CBC WITH DIFFERENTIAL/PLATELET
BASOS PCT: 1 %
Basophils Absolute: 0 10*3/uL (ref 0.0–0.1)
EOS ABS: 0.1 10*3/uL (ref 0.0–0.7)
EOS PCT: 2 %
HCT: 41.5 % (ref 36.0–46.0)
Hemoglobin: 13.4 g/dL (ref 12.0–15.0)
Lymphocytes Relative: 31 %
Lymphs Abs: 1.9 10*3/uL (ref 0.7–4.0)
MCH: 31.7 pg (ref 26.0–34.0)
MCHC: 32.3 g/dL (ref 30.0–36.0)
MCV: 98.1 fL (ref 78.0–100.0)
MONO ABS: 0.5 10*3/uL (ref 0.1–1.0)
Monocytes Relative: 9 %
NEUTROS ABS: 3.6 10*3/uL (ref 1.7–7.7)
Neutrophils Relative %: 57 %
PLATELETS: 227 10*3/uL (ref 150–400)
RBC: 4.23 MIL/uL (ref 3.87–5.11)
RDW: 12.4 % (ref 11.5–15.5)
WBC: 6.2 10*3/uL (ref 4.0–10.5)

## 2017-05-30 LAB — HEPATIC FUNCTION PANEL
ALBUMIN: 4.6 g/dL (ref 3.5–5.0)
ALT: 14 U/L (ref 14–54)
AST: 19 U/L (ref 15–41)
Alkaline Phosphatase: 54 U/L (ref 38–126)
Bilirubin, Direct: 0.1 mg/dL (ref 0.1–0.5)
Indirect Bilirubin: 0.5 mg/dL (ref 0.3–0.9)
TOTAL PROTEIN: 7.5 g/dL (ref 6.5–8.1)
Total Bilirubin: 0.6 mg/dL (ref 0.3–1.2)

## 2017-05-30 LAB — BASIC METABOLIC PANEL
ANION GAP: 9 (ref 5–15)
BUN: 13 mg/dL (ref 6–20)
CHLORIDE: 100 mmol/L — AB (ref 101–111)
CO2: 27 mmol/L (ref 22–32)
Calcium: 9.8 mg/dL (ref 8.9–10.3)
Creatinine, Ser: 0.83 mg/dL (ref 0.44–1.00)
GFR calc non Af Amer: >60 mL/min (ref >60)
Glucose, Bld: 95 mg/dL (ref 65–99)
Potassium: 4 mmol/L (ref 3.5–5.1)
SODIUM: 136 mmol/L (ref 135–145)

## 2017-05-30 LAB — LIPASE, BLOOD: LIPASE: 28 U/L (ref 11–51)

## 2017-05-30 MED ORDER — IOPAMIDOL (ISOVUE-300) INJECTION 61%
100.0000 mL | Freq: Once | INTRAVENOUS | Status: AC | PRN
  Administered 2017-05-30: 100 mL via INTRAVENOUS

## 2017-05-30 NOTE — Progress Notes (Signed)
   Subjective:    Patient ID: [name], female    DOB: [birth year], 52 y.o.   MRN: [id number]  Abdominal Pain  This is a new problem. The current episode started more than 1 month ago. Associated symptoms include constipation, diarrhea and nausea. Pertinent negatives include no fever or headaches. Associated symptoms comments: Bloating, pain in stomach on left side, feeling pressure at rectum area, . The pain is aggravated by eating (eating caused pt stomach to "blow up"). The pain is relieved by nothing. Treatments tried: stool softners, lots of water. The treatment provided no relief.   Patient with significant lower abdominal pain she relates to present over the past 6-8 weeks has had some mucus in her bowel movements no blood in her bowel movements relates over the past few weeks to change in bowel movements.  Is more constipated painful at times she does take some stool softeners without success she denies reflux symptoms denies epigastric pain she did not take the Nexium because she did not feel she had reflux  Patient does smoke-patient has been counseled to quit   Review of Systems  Constitutional: Negative for activity change, fatigue and fever.  HENT: Negative for congestion.   Respiratory: Negative for cough, chest tightness and shortness of breath.   Cardiovascular: Negative for chest pain and leg swelling.  Gastrointestinal: Positive for abdominal pain, constipation, diarrhea and nausea. Negative for abdominal distention and anal bleeding.  Skin: Negative for color change.  Neurological: Negative for headaches.  Psychiatric/Behavioral: Negative for behavioral problems.       Objective:   Physical Exam  Constitutional: She appears well-nourished. No distress.  HENT:  Head: Normocephalic.  Right Ear: External ear normal.  Left Ear: External ear normal.  Eyes: Right eye exhibits no discharge. Left eye exhibits no discharge.  Neck: No tracheal deviation present.    Cardiovascular: Normal rate, regular rhythm and normal heart sounds.  No murmur heard. Pulmonary/Chest: Effort normal and breath sounds normal. No respiratory distress. She has no wheezes. She has no rales.  Abdominal: Soft. She exhibits no distension. There is tenderness. There is no rebound and no guarding.  Musculoskeletal: She exhibits no edema.  Lymphadenopathy:    She has no cervical adenopathy.  Neurological: She is alert.  Psychiatric: Her behavior is normal.  Vitals reviewed.  Patient has tenderness in the left lower quadrant       Assessment & Plan:  Stat lab work Stat CAT scan Need to rule out the possibility of diverticulitis I doubt tumor Patient will need GI referral and colonoscopy Await stat findings.  MiraLAX as a stool softener.

## 2017-05-31 ENCOUNTER — Telehealth: Payer: Self-pay | Admitting: *Deleted

## 2017-05-31 ENCOUNTER — Other Ambulatory Visit: Payer: Self-pay | Admitting: *Deleted

## 2017-05-31 ENCOUNTER — Other Ambulatory Visit: Payer: Self-pay | Admitting: Family Medicine

## 2017-05-31 DIAGNOSIS — E785 Hyperlipidemia, unspecified: Secondary | ICD-10-CM

## 2017-05-31 DIAGNOSIS — R1032 Left lower quadrant pain: Secondary | ICD-10-CM

## 2017-05-31 NOTE — Progress Notes (Signed)
Patient is aware 

## 2017-05-31 NOTE — Telephone Encounter (Signed)
Pt prefers to see Dr. Karilyn Cotaehman.

## 2017-06-01 ENCOUNTER — Telehealth: Payer: Self-pay | Admitting: Family Medicine

## 2017-06-01 ENCOUNTER — Encounter: Payer: Self-pay | Admitting: Family Medicine

## 2017-06-01 NOTE — Telephone Encounter (Signed)
Discussed with pt. Pt states she will get the money and have it done this afternoon. Pt states she will still be fasting this afternoon when she goes

## 2017-06-01 NOTE — Telephone Encounter (Signed)
Left message with pt to return call to discuss message below and to give her the number to the free clinic [place] [phone]

## 2017-06-01 NOTE — Telephone Encounter (Signed)
I am sympathetic to what is going on with her but I do not have other suggestions at this time hopefully at some point she will have insurance and cannot afford to do this-she can try the free clinic-I am not sure if they can help

## 2017-06-01 NOTE — Telephone Encounter (Signed)
Pt came in stating that she was unable to do her labs due to [facility] requiring the amount for the labs up front. Please advise.

## 2017-06-02 ENCOUNTER — Other Ambulatory Visit: Payer: Self-pay | Admitting: *Deleted

## 2017-06-02 DIAGNOSIS — E785 Hyperlipidemia, unspecified: Secondary | ICD-10-CM

## 2017-06-02 LAB — LIPID PANEL
CHOLESTEROL TOTAL: 181 mg/dL (ref 100–199)
Chol/HDL Ratio: 3 ratio (ref 0.0–4.4)
HDL: 60 mg/dL (ref >39)
LDL Calculated: 102 mg/dL — ABNORMAL HIGH (ref 0–99)
Triglycerides: 93 mg/dL (ref 0–149)
VLDL CHOLESTEROL CAL: 19 mg/dL (ref 5–40)

## 2017-06-05 ENCOUNTER — Encounter: Payer: Self-pay | Admitting: Gastroenterology

## 2017-06-08 ENCOUNTER — Ambulatory Visit: Payer: Self-pay | Admitting: Family Medicine

## 2017-08-02 ENCOUNTER — Ambulatory Visit: Payer: Self-pay | Admitting: Gastroenterology

## 2017-09-21 ENCOUNTER — Ambulatory Visit: Payer: Self-pay | Admitting: Family Medicine

## 2017-09-21 ENCOUNTER — Encounter: Payer: Self-pay | Admitting: Family Medicine

## 2017-09-21 VITALS — BP 116/72 | Temp 98.2°F | Ht 67.0 in | Wt 98.6 lb

## 2017-09-21 DIAGNOSIS — F321 Major depressive disorder, single episode, moderate: Secondary | ICD-10-CM

## 2017-09-21 DIAGNOSIS — J019 Acute sinusitis, unspecified: Secondary | ICD-10-CM

## 2017-09-21 MED ORDER — CEPHALEXIN 250 MG PO CAPS: ORAL_CAPSULE | ORAL | 0 refills | Status: DC

## 2017-09-21 MED ORDER — ARIPIPRAZOLE 2 MG PO TABS: 2.0000 mg | ORAL_TABLET | Freq: Every day | ORAL | 2 refills | Status: DC

## 2017-09-21 MED ORDER — ALPRAZOLAM 1 MG PO TABS: ORAL_TABLET | ORAL | 5 refills | Status: DC

## 2017-09-21 NOTE — Progress Notes (Signed)
   Subjective:    Patient ID: [name], female    DOB: 21-Jan-1966, 52 y.o.   MRN: [id number]  Sinusitis  This is a new problem. The current episode started in the past 7 days. Associated symptoms include chills, congestion, coughing and sinus pressure. (Runny nose, can only breathe through mouth) Past treatments include nothing.   Significant congestion drainage coughing denies high fevers denies wheezing difficulty breathing  Also has chronic depression that seems to be getting worse not suicidal but finds himself feeling sad down depressed at times   Review of Systems  Constitutional: Positive for chills.  HENT: Positive for congestion and sinus pressure.   Respiratory: Positive for cough.        Objective:   Physical Exam Lungs clear respiratory rate normal heart regular eardrums normal sinus moderate tenderness  Patient denies being suicidal       Assessment & Plan:  Sinusitis Antibiotics prescribed warning signs discussed  Patient encouraged to stay away from smoking  Moderate depression long-standing already on medication did not tolerate trazodone continue Cymbalta add Abilify 2 mg daily if causing negative side effects to notify [place] follow-up if progressive troubles warning signs discussed in detail follow-up again in 2 to 3 months to give us update on how she is doing

## 2017-09-22 NOTE — Progress Notes (Signed)
Pt states she will call back in 2 -3 weeks with an update and she did start abilify last night.

## 2017-09-22 NOTE — Progress Notes (Signed)
Left message to return call 

## 2017-09-25 ENCOUNTER — Telehealth: Payer: Self-pay | Admitting: Family Medicine

## 2017-09-25 DIAGNOSIS — F419 Anxiety disorder, unspecified: Secondary | ICD-10-CM

## 2017-09-25 DIAGNOSIS — F3289 Other specified depressive episodes: Secondary | ICD-10-CM

## 2017-09-25 NOTE — Telephone Encounter (Signed)
Patient states she would be interested and I [facility]

## 2017-09-25 NOTE — Telephone Encounter (Signed)
Patient said that she was told to call in after taking her Abilify to let Dr. Lorin PicketScott know how this was working for her.  She said she woke up this morning sick on her stomach and a terrible headache.  She is unsure if this is related to the Abilify or the antibiotic he put her on.

## 2017-09-25 NOTE — Telephone Encounter (Signed)
FYI-[facility] would do everything at a very reduced price or sometimes free

## 2017-09-25 NOTE — Telephone Encounter (Signed)
Patient is aware of all.She states she can not afford another Dr at this point. I advised that if she needs us to please do not hesitate to call.

## 2017-09-25 NOTE — Telephone Encounter (Signed)
She states she started the Abilify on Thursday night.She state she threw up this am and this is the first time she has had any problems.She states she was working in the yard yesterday and and developed a headache which she went to bed with it. She takes the antibx four times per day and the other meds she takes at night.She states the vomit this am tasted like medicine. She states she has had nausea prior to this.She wants to know if she needs to continue the antibx or the Abilify. Also can she take something for a headache. Please advise.

## 2017-09-25 NOTE — Telephone Encounter (Signed)
I would encourage her to hold off on the Abilify at this point- it is difficult to know if it is the antibiotic causing her upset stomach or the Abilify.  I would continue the antibiotic for now.  If she continues to have upset stomach she should let us know.  Because we may have to switch the antibiotic at that point.  May use Tylenol as needed for headache-if progressive troubles with headache she will need to be seen-we can help set her up with mental health specialist for her depression if she is interested

## 2017-10-23 ENCOUNTER — Encounter: Payer: Self-pay | Admitting: Family Medicine

## 2017-10-23 ENCOUNTER — Ambulatory Visit: Payer: Self-pay | Admitting: Family Medicine

## 2017-10-23 VITALS — Ht 67.0 in | Wt 99.4 lb

## 2017-10-23 DIAGNOSIS — R609 Edema, unspecified: Secondary | ICD-10-CM

## 2017-10-23 LAB — POCT URINALYSIS DIPSTICK
Spec Grav, UA: 1.02 (ref 1.010–1.025)
pH, UA: 5 (ref 5.0–8.0)

## 2017-10-23 MED ORDER — LORATADINE 10 MG PO TABS: 10.0000 mg | ORAL_TABLET | Freq: Every day | ORAL | 5 refills | Status: DC

## 2017-10-23 NOTE — Progress Notes (Signed)
Subjective:    Patient ID: [name], female    DOB: 07/08/1965, 52 y.o.   MRN: [id number]  HPIswelling in arms and hands since last week.  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   Results for orders placed or performed in visit on 10/23/17  POCT urinalysis dipstick  Result Value Ref Range   Color, UA     Clarity, UA     Glucose, UA  Negative   Bilirubin, UA     Ketones, UA     Spec Grav, UA 1.020 1.010 - 1.025   Blood, UA     pH, UA 5.0 5.0 - 8.0   Protein, UA  Negative   Urobilinogen, UA  0.2 or 1.0 E.U./dL   Nitrite, UA     Leukocytes, UA  Negative   Appearance     Odor       Review of Systems  Constitutional: Negative for activity change, appetite change and fatigue.  HENT: Negative for congestion and rhinorrhea.   Respiratory: Negative for cough and shortness of breath.   Cardiovascular: Negative for chest pain and leg swelling.  Gastrointestinal: Negative for abdominal pain.  Endocrine: Negative for polydipsia and polyphagia.  Skin: Negative for color change.  Neurological: Negative for weakness.  Psychiatric/Behavioral: Negative for confusion.       Objective:   Physical Exam  Constitutional: She appears well-nourished. No distress.  Cardiovascular: Normal rate, regular rhythm and normal heart sounds.    No murmur heard. Pulmonary/Chest: Effort normal and breath sounds normal. No respiratory distress.  Musculoskeletal: She exhibits no edema.  Lymphadenopathy:    She has no cervical adenopathy.  Neurological: She is alert. She exhibits normal muscle tone.  Psychiatric: Her behavior is normal.  Vitals reviewed.         Assessment & Plan:  Patient with peripheral edema Not severe currently It was worse yesterday No diuretic indicated No proteinuria Probably related to the heat she is working in  Intermittent hives on her arms I doubt this is due to recent antibiotics or recent Abilify from several weeks ago I believe it is more likely related to the excessive heat and chemical exposures working as in a [employer]  Patient has significant depression symptoms her ex-husband is here with her today he has been given permission to talk with me he is concerned that she has bipolar disease because he relates how quickly her emotions can flip and how this is affecting her relationships with other people and inability to keep the job patient [facility]  Patient has had chronic anxiety for years and is been on Xanax for years  The patient has been on multiple different antidepressants without success and recently had side effects  to Abilify  We have never diagnosed bipolar or treated her for this but a psychiatry consultation would be most appreciated  15 minutes was spent with patient today discussing healthcare issues which they came.  More than 50% of this visit-total duration of visit-was spent in counseling and coordination of care.  Please see diagnosis regarding the focus of this coordination and care

## 2017-11-27 ENCOUNTER — Ambulatory Visit: Payer: Self-pay | Admitting: Family Medicine

## 2017-12-15 ENCOUNTER — Telehealth: Payer: Self-pay | Admitting: Family Medicine

## 2017-12-15 NOTE — Telephone Encounter (Signed)
I called and spoke with patient she is aware that if worsens to go to the ed. Transferred upfront to have an appt scheduled for next week.

## 2017-12-15 NOTE — Telephone Encounter (Signed)
I called the pt to check and make sure she is not in the mental states of harming herself or others and she states she is not,she is just depressed and has not been able to make it to work.She states she is currently on Cymbalta and Xanax. Please advise.

## 2017-12-15 NOTE — Telephone Encounter (Signed)
Patient called today wanting to reschedule appointment she had to cancel on 8/19 for depression/anxiety.All you have left for the month is same day.Your first available appointment is the third week of October. She states that she has deal with this issue all her life but the past two months have been really stuff and she hadn't been able to go to work. I can put her in with lindsay but wanted to know if that's ok to do and if she is willing to see her. Please advise.

## 2017-12-15 NOTE — Telephone Encounter (Signed)
It would be fine to put her in with Mardella Layman but I would recommend not on Monday Patient to go to ER if worse

## 2017-12-20 ENCOUNTER — Ambulatory Visit: Payer: Self-pay | Admitting: Family Medicine

## 2017-12-20 ENCOUNTER — Encounter: Payer: Self-pay | Admitting: Family Medicine

## 2017-12-20 VITALS — Ht 67.0 in | Wt 101.0 lb

## 2017-12-20 DIAGNOSIS — F322 Major depressive disorder, single episode, severe without psychotic features: Secondary | ICD-10-CM

## 2017-12-20 NOTE — Progress Notes (Signed)
   Subjective:    Patient ID: [name], female    DOB: 1965/07/31, 52 y.o.   MRN: [id number]  Anxiety  Presents for follow-up visit. Symptoms include chest pain, excessive worry, panic and shortness of breath. The quality of sleep is poor.    This patient relates that she gets very stressed and anxious nervous panic attacks in addition to this also finds herself feeling stressed out about things depressed about things at times she feels so sad she does not want to go anywhere she denies being suicidal.  She states the medicine does not seem to be helping  Couple months ago we recommended Abilify she took it for a while then she had a rash on her arms that she thought was due to the medicine but looking back she thinks it was related to her job in a [employer] because she states this rash comes and goes whenever she does that she is desiring to try that medicine again  She also relates panic attacks intermittent chest pain shortness of breath excessive worry    Review of Systems  Respiratory: Positive for shortness of breath.   Cardiovascular: Positive for chest pain.       Objective:   Physical Exam  Lungs are clear respiratory rate normal heart is regular no murmurs     Assessment & Plan:  Severe anxiety as well as panic attacks and major depression Continue Cymbalta She is tried numerous medications in the past without help Also recommend that the patient be seen by psychiatry.  Also do some counseling she would like to go to [facility] she is going to apply for financial aid to help her go  She will restart Abilify she will follow-up here in 4 to 6 weeks Patient incapacitated by her problem unable to work currently Patient not suicidal

## 2017-12-21 NOTE — Progress Notes (Signed)
Patient is aware of the referral and it has been placed and will await someone to schedule and call her with information.

## 2017-12-21 NOTE — Progress Notes (Signed)
I called and left a message to r/c. 

## 2018-01-30 ENCOUNTER — Other Ambulatory Visit: Payer: Self-pay | Admitting: Family Medicine

## 2018-02-12 ENCOUNTER — Encounter: Payer: Self-pay | Admitting: Family Medicine

## 2018-02-12 ENCOUNTER — Ambulatory Visit (INDEPENDENT_AMBULATORY_CARE_PROVIDER_SITE_OTHER): Payer: Self-pay | Admitting: Family Medicine

## 2018-02-12 VITALS — BP 96/70 | Ht 67.0 in | Wt 107.8 lb

## 2018-02-12 DIAGNOSIS — F322 Major depressive disorder, single episode, severe without psychotic features: Secondary | ICD-10-CM

## 2018-02-12 DIAGNOSIS — R5383 Other fatigue: Secondary | ICD-10-CM

## 2018-02-12 NOTE — Progress Notes (Signed)
   Subjective:    Patient ID: [name], female    DOB: 04-Sep-1965, 53 y.o.   MRN: [id number]  HPIMed check up. Pt states she is still waiting to hear back from behavior health. States she has not seen anyone yet.   Pt states she would like to be checked for lupus. Her sister had lupus and pt thinks some of the symptoms she has had was what her sister had, mood swings, hot flashes.   Patient has 2 depression doing better with Abilify states she will go ahead and schedule her an appointment with psychiatry not suicidal today  Review of Systems  Constitutional: Negative for activity change and appetite change.  HENT: Negative for congestion and rhinorrhea.   Respiratory: Negative for cough and shortness of breath.   Cardiovascular: Negative for chest pain and leg swelling.  Gastrointestinal: Negative for abdominal pain, nausea and vomiting.  Skin: Negative for color change.  Neurological: Negative for dizziness and weakness.  Psychiatric/Behavioral: Negative for agitation and confusion.       Objective:   Physical Exam  Constitutional: She appears well-nourished. No distress.  HENT:  Head: Normocephalic.  Cardiovascular: Normal rate, regular rhythm and normal heart sounds.  No murmur heard. Pulmonary/Chest: Effort normal and breath sounds normal.  Musculoskeletal: She exhibits no edema.  Lymphadenopathy:    She has no cervical adenopathy.  Neurological: She is alert.  Psychiatric: Her behavior is normal.  Vitals reviewed.         Assessment & Plan:  Persistent depression doing better on medication currently Follow-up 3 months I encourage patient to be seen by psychiatry she is concerned she may have bipolar I told her psychiatry is more qualified to figure this out patient states she will follow-up with [place] in 3 months and she will try to get herself an appointment with psychiatry  She is concerned about the possibility of lupus she will be doing some lab testing but I  doubt she has lupus based on her symptomatology

## 2018-03-22 ENCOUNTER — Telehealth: Payer: Self-pay | Admitting: Family Medicine

## 2018-03-22 NOTE — Telephone Encounter (Signed)
Last blood work ordered pt lost paper order, she is wanting another copy of blood work.

## 2018-03-22 NOTE — Telephone Encounter (Signed)
I spoke with the pt and she is aware she does not need the paper to have the labs drawn.

## 2018-03-27 LAB — CBC WITH DIFFERENTIAL/PLATELET
BASOS ABS: 0.1 10*3/uL (ref 0.0–0.2)
Basos: 1 %
EOS (ABSOLUTE): 0.1 10*3/uL (ref 0.0–0.4)
Eos: 1 %
Hematocrit: 34.7 % (ref 34.0–46.6)
Hemoglobin: 11.9 g/dL (ref 11.1–15.9)
Immature Grans (Abs): 0.1 10*3/uL (ref 0.0–0.1)
Immature Granulocytes: 1 %
LYMPHS ABS: 2.1 10*3/uL (ref 0.7–3.1)
Lymphs: 26 %
MCH: 33.5 pg — AB (ref 26.6–33.0)
MCHC: 34.3 g/dL (ref 31.5–35.7)
MCV: 98 fL — ABNORMAL HIGH (ref 79–97)
MONOCYTES: 9 %
MONOS ABS: 0.7 10*3/uL (ref 0.1–0.9)
NEUTROS ABS: 4.8 10*3/uL (ref 1.4–7.0)
Neutrophils: 62 %
PLATELETS: 232 10*3/uL (ref 150–450)
RBC: 3.55 x10E6/uL — AB (ref 3.77–5.28)
RDW: 11.6 % — AB (ref 12.3–15.4)
WBC: 7.9 10*3/uL (ref 3.4–10.8)

## 2018-03-27 LAB — ANA: ANA: POSITIVE — AB

## 2018-03-27 LAB — SEDIMENTATION RATE: SED RATE: 2 mm/h (ref 0–40)

## 2018-03-28 ENCOUNTER — Other Ambulatory Visit: Payer: Self-pay | Admitting: Family Medicine

## 2018-03-28 DIAGNOSIS — Z840 Family history of diseases of the skin and subcutaneous tissue: Secondary | ICD-10-CM

## 2018-04-07 ENCOUNTER — Other Ambulatory Visit: Payer: Self-pay | Admitting: Family Medicine

## 2018-04-09 NOTE — Telephone Encounter (Signed)
May have each 1 of these with 2 refills

## 2018-05-15 ENCOUNTER — Ambulatory Visit: Payer: Self-pay | Admitting: Family Medicine

## 2018-05-15 VITALS — Wt 119.0 lb

## 2018-05-15 DIAGNOSIS — F3289 Other specified depressive episodes: Secondary | ICD-10-CM

## 2018-05-15 DIAGNOSIS — Z131 Encounter for screening for diabetes mellitus: Secondary | ICD-10-CM

## 2018-05-15 DIAGNOSIS — F419 Anxiety disorder, unspecified: Secondary | ICD-10-CM

## 2018-05-15 DIAGNOSIS — Z13220 Encounter for screening for lipoid disorders: Secondary | ICD-10-CM

## 2018-05-15 MED ORDER — DULOXETINE HCL 60 MG PO CPEP: 60.0000 mg | ORAL_CAPSULE | Freq: Every day | ORAL | 4 refills | Status: DC

## 2018-05-15 MED ORDER — ARIPIPRAZOLE 2 MG PO TABS: 2.0000 mg | ORAL_TABLET | Freq: Every day | ORAL | 2 refills | Status: DC

## 2018-05-15 MED ORDER — ALPRAZOLAM 1 MG PO TABS: ORAL_TABLET | ORAL | 5 refills | Status: DC

## 2018-05-15 NOTE — Progress Notes (Signed)
Patient presents for checkup She has depression She takes her medicine on a regular basis She denies any setbacks There are some times where she feels more depressed than others but other times she feels pretty good about how things are going She works at [employer] She does have her ex-husband who helps her with her care Patient denies any setbacks recently She has gained a moderate amount of weight which concerns her She wonders if there may be some underlying issue She is taking Abilify along with Cymbalta she states that she is careful with her nerve medication usage denies abusing the medicine drug registry checked. Review of systems benign negative for bleeding abdominal pain vomiting Lungs are clear respiratory rate normal heart is regular no murmurs pulses normal BP good extremities no edema Patient due for lipid and glucose screening Continue current medications I recommend tapering the Abilify may use half tablet daily for the next month if doing well in the spring time she can stop the medicine and as long as her moods are doing well she can stay off the medicine She is to follow-up in 4 months time I also recommended mammogram and colonoscopy currently patient cannot afford I recommended that she discuss it with the business office at [facility] to see if they can help her with the cost of these procedures

## 2018-06-29 ENCOUNTER — Other Ambulatory Visit: Payer: Self-pay | Admitting: Family Medicine

## 2018-07-21 ENCOUNTER — Observation Stay (HOSPITAL_COMMUNITY): Payer: Self-pay

## 2018-07-21 ENCOUNTER — Emergency Department (HOSPITAL_COMMUNITY): Payer: Self-pay

## 2018-07-21 ENCOUNTER — Encounter (HOSPITAL_COMMUNITY): Payer: Self-pay | Admitting: Emergency Medicine

## 2018-07-21 ENCOUNTER — Other Ambulatory Visit: Payer: Self-pay

## 2018-07-21 ENCOUNTER — Inpatient Hospital Stay (HOSPITAL_COMMUNITY)
Admit: 2018-07-21 | Discharge: 2018-07-23 | DRG: 101 | Disposition: A | Discharge status: Home / Self Care | Payer: Self-pay | Attending: Internal Medicine | Admitting: Internal Medicine

## 2018-07-21 DIAGNOSIS — Z820 Family history of epilepsy and other diseases of the nervous system: Secondary | ICD-10-CM

## 2018-07-21 DIAGNOSIS — Z720 Tobacco use: Secondary | ICD-10-CM | POA: Diagnosis present

## 2018-07-21 DIAGNOSIS — F1210 Cannabis abuse, uncomplicated: Secondary | ICD-10-CM

## 2018-07-21 DIAGNOSIS — Z880 Allergy status to penicillin: Secondary | ICD-10-CM

## 2018-07-21 DIAGNOSIS — E876 Hypokalemia: Secondary | ICD-10-CM

## 2018-07-21 DIAGNOSIS — Q0700 Arnold-Chiari syndrome without spina bifida or hydrocephalus: Secondary | ICD-10-CM

## 2018-07-21 DIAGNOSIS — K589 Irritable bowel syndrome without diarrhea: Secondary | ICD-10-CM | POA: Diagnosis present

## 2018-07-21 DIAGNOSIS — Z806 Family history of leukemia: Secondary | ICD-10-CM

## 2018-07-21 DIAGNOSIS — Z9119 Patient's noncompliance with other medical treatment and regimen: Secondary | ICD-10-CM

## 2018-07-21 DIAGNOSIS — F419 Anxiety disorder, unspecified: Secondary | ICD-10-CM | POA: Diagnosis present

## 2018-07-21 DIAGNOSIS — Z881 Allergy status to other antibiotic agents status: Secondary | ICD-10-CM

## 2018-07-21 DIAGNOSIS — Z833 Family history of diabetes mellitus: Secondary | ICD-10-CM

## 2018-07-21 DIAGNOSIS — Z9114 Patient's other noncompliance with medication regimen: Secondary | ICD-10-CM

## 2018-07-21 DIAGNOSIS — R569 Unspecified convulsions: Secondary | ICD-10-CM

## 2018-07-21 DIAGNOSIS — F17210 Nicotine dependence, cigarettes, uncomplicated: Secondary | ICD-10-CM | POA: Diagnosis present

## 2018-07-21 DIAGNOSIS — Z818 Family history of other mental and behavioral disorders: Secondary | ICD-10-CM

## 2018-07-21 DIAGNOSIS — Z811 Family history of alcohol abuse and dependence: Secondary | ICD-10-CM

## 2018-07-21 DIAGNOSIS — T17908A Unspecified foreign body in respiratory tract, part unspecified causing other injury, initial encounter: Secondary | ICD-10-CM

## 2018-07-21 DIAGNOSIS — Z888 Allergy status to other drugs, medicaments and biological substances status: Secondary | ICD-10-CM

## 2018-07-21 DIAGNOSIS — G40909 Epilepsy, unspecified, not intractable, without status epilepticus: Principal | ICD-10-CM | POA: Diagnosis present

## 2018-07-21 DIAGNOSIS — Z8249 Family history of ischemic heart disease and other diseases of the circulatory system: Secondary | ICD-10-CM

## 2018-07-21 DIAGNOSIS — Z8262 Family history of osteoporosis: Secondary | ICD-10-CM

## 2018-07-21 DIAGNOSIS — F329 Major depressive disorder, single episode, unspecified: Secondary | ICD-10-CM | POA: Diagnosis present

## 2018-07-21 DIAGNOSIS — Z813 Family history of other psychoactive substance abuse and dependence: Secondary | ICD-10-CM

## 2018-07-21 DIAGNOSIS — I340 Nonrheumatic mitral (valve) insufficiency: Secondary | ICD-10-CM | POA: Diagnosis present

## 2018-07-21 DIAGNOSIS — I248 Other forms of acute ischemic heart disease: Secondary | ICD-10-CM | POA: Diagnosis not present

## 2018-07-21 DIAGNOSIS — R4182 Altered mental status, unspecified: Secondary | ICD-10-CM | POA: Diagnosis present

## 2018-07-21 DIAGNOSIS — J449 Chronic obstructive pulmonary disease, unspecified: Secondary | ICD-10-CM | POA: Diagnosis present

## 2018-07-21 LAB — URINALYSIS, ROUTINE W REFLEX MICROSCOPIC
Bacteria, UA: NONE SEEN
Bilirubin Urine: NEGATIVE
Glucose, UA: NEGATIVE mg/dL
Ketones, ur: NEGATIVE mg/dL
Leukocytes,Ua: NEGATIVE
Nitrite: NEGATIVE
Protein, ur: 30 mg/dL — AB
Specific Gravity, Urine: 1.017 (ref 1.005–1.030)
pH: 5 (ref 5.0–8.0)

## 2018-07-21 LAB — MAGNESIUM: Magnesium: 2.2 mg/dL (ref 1.7–2.4)

## 2018-07-21 LAB — CBC WITH DIFFERENTIAL/PLATELET
Abs Immature Granulocytes: 0.1 10*3/uL — ABNORMAL HIGH (ref 0.00–0.07)
Basophils Absolute: 0.1 10*3/uL (ref 0.0–0.1)
Basophils Relative: 0 %
Eosinophils Absolute: 0.1 10*3/uL (ref 0.0–0.5)
Eosinophils Relative: 0 %
HCT: 37.6 % (ref 36.0–46.0)
Hemoglobin: 11.8 g/dL — ABNORMAL LOW (ref 12.0–15.0)
Immature Granulocytes: 1 %
Lymphocytes Relative: 9 %
Lymphs Abs: 1.4 10*3/uL (ref 0.7–4.0)
MCH: 32.2 pg (ref 26.0–34.0)
MCHC: 31.4 g/dL (ref 30.0–36.0)
MCV: 102.7 fL — ABNORMAL HIGH (ref 80.0–100.0)
Monocytes Absolute: 1.2 10*3/uL — ABNORMAL HIGH (ref 0.1–1.0)
Monocytes Relative: 8 %
Neutro Abs: 13.1 10*3/uL — ABNORMAL HIGH (ref 1.7–7.7)
Neutrophils Relative %: 82 %
Platelets: 218 10*3/uL (ref 150–400)
RBC: 3.66 MIL/uL — ABNORMAL LOW (ref 3.87–5.11)
RDW: 11.9 % (ref 11.5–15.5)
WBC: 16 10*3/uL — ABNORMAL HIGH (ref 4.0–10.5)
nRBC: 0 % (ref 0.0–0.2)

## 2018-07-21 LAB — COMPREHENSIVE METABOLIC PANEL
ALT: 12 U/L (ref 0–44)
AST: 23 U/L (ref 15–41)
Albumin: 4.4 g/dL (ref 3.5–5.0)
Alkaline Phosphatase: 52 U/L (ref 38–126)
Anion gap: 7 (ref 5–15)
BUN: 14 mg/dL (ref 6–20)
CO2: 24 mmol/L (ref 22–32)
Calcium: 8.7 mg/dL — ABNORMAL LOW (ref 8.9–10.3)
Chloride: 108 mmol/L (ref 98–111)
Creatinine, Ser: 0.91 mg/dL (ref 0.44–1.00)
GFR calc Af Amer: >60 mL/min (ref >60)
GFR calc non Af Amer: >60 mL/min (ref >60)
Glucose, Bld: 80 mg/dL (ref 70–99)
Potassium: 3.2 mmol/L — ABNORMAL LOW (ref 3.5–5.1)
Sodium: 139 mmol/L (ref 135–145)
Total Bilirubin: 0.3 mg/dL (ref 0.3–1.2)
Total Protein: 6.8 g/dL (ref 6.5–8.1)

## 2018-07-21 LAB — RAPID URINE DRUG SCREEN, HOSP PERFORMED
Amphetamines: NOT DETECTED
Barbiturates: NOT DETECTED
Benzodiazepines: POSITIVE — AB
Cocaine: NOT DETECTED
Opiates: NOT DETECTED
Tetrahydrocannabinol: POSITIVE — AB

## 2018-07-21 LAB — CBG MONITORING, ED: Glucose-Capillary: 157 mg/dL — ABNORMAL HIGH (ref 70–99)

## 2018-07-21 LAB — ETHANOL: Alcohol, Ethyl (B): <10 mg/dL (ref <10)

## 2018-07-21 MED ORDER — LORAZEPAM 2 MG/ML IJ SOLN
INTRAMUSCULAR | Status: AC
  Administered 2018-07-21: 2 mg via INTRAVENOUS
  Filled 2018-07-21: qty 1

## 2018-07-21 MED ORDER — STERILE WATER FOR INJECTION IJ SOLN
INTRAMUSCULAR | Status: AC
  Administered 2018-07-21: 1.2 mL
  Filled 2018-07-21: qty 10

## 2018-07-21 MED ORDER — LEVETIRACETAM IN NACL 500 MG/100ML IV SOLN
500.0000 mg | Freq: Two times a day (BID) | INTRAVENOUS | Status: DC
  Administered 2018-07-22 – 2018-07-23 (×3): 500 mg via INTRAVENOUS
  Filled 2018-07-21 (×6): qty 100

## 2018-07-21 MED ORDER — LORAZEPAM 2 MG/ML IJ SOLN
2.0000 mg | Freq: Once | INTRAMUSCULAR | Status: AC
  Administered 2018-07-21: 17:00:00 2 mg via INTRAVENOUS

## 2018-07-21 MED ORDER — SODIUM CHLORIDE 0.9 % IV BOLUS
1000.0000 mL | Freq: Once | INTRAVENOUS | Status: AC
  Administered 2018-07-21: 17:00:00 1000 mL via INTRAVENOUS

## 2018-07-21 MED ORDER — LORAZEPAM 2 MG/ML IJ SOLN
1.0000 mg | Freq: Four times a day (QID) | INTRAMUSCULAR | Status: DC | PRN
  Administered 2018-07-22 (×2): 1 mg via INTRAVENOUS
  Filled 2018-07-21 (×2): qty 1

## 2018-07-21 MED ORDER — LORAZEPAM 2 MG/ML IJ SOLN
2.0000 mg | Freq: Once | INTRAMUSCULAR | Status: AC
  Administered 2018-07-21: 18:00:00 2 mg via INTRAVENOUS

## 2018-07-21 MED ORDER — LORAZEPAM 2 MG/ML IJ SOLN
2.0000 mg | INTRAMUSCULAR | Status: DC | PRN
  Administered 2018-07-21: 2 mg via INTRAVENOUS
  Filled 2018-07-21: qty 1

## 2018-07-21 MED ORDER — ZIPRASIDONE MESYLATE 20 MG IM SOLR
INTRAMUSCULAR | Status: AC
  Filled 2018-07-21: qty 20

## 2018-07-21 MED ORDER — LORAZEPAM 2 MG/ML IJ SOLN
INTRAMUSCULAR | Status: AC
  Filled 2018-07-21: qty 1

## 2018-07-21 MED ORDER — LEVETIRACETAM IN NACL 1000 MG/100ML IV SOLN
1000.0000 mg | Freq: Once | INTRAVENOUS | Status: AC
  Administered 2018-07-22: 1000 mg via INTRAVENOUS
  Filled 2018-07-21: qty 100

## 2018-07-21 MED ORDER — ZIPRASIDONE MESYLATE 20 MG IM SOLR
20.0000 mg | Freq: Once | INTRAMUSCULAR | Status: AC
  Administered 2018-07-21: 20 mg via INTRAMUSCULAR

## 2018-07-21 NOTE — H&P (Signed)
TRH H&P    Patient Demographics:    [name], is a 53 y.o. female  MRN: [id number]  DOB - [birth year]  Admit Date - 07/21/2018  Referring MD/NP/PA:  Nat Christen  Outpatient Primary MD for the patient is Kathyrn Drown, MD   [phone]  Silva Bandy, sister)  Patient coming from: home  Chief complaint- seizure ?, altered mental status    HPI:    [name]  is a 53 y.o. female,w  Seizure do, Copd, IBS, Anxiety/ Depression who apparently presented with seizure while at sisters house. Pt had seizure for about 5 minutes per sister. Glazed over eyes, and seemed confused.  R arm was shaking a little.  No tongue bite, no jerking motions with her legs, no incontinence.  Her sister called EMS and was brought to ED.   Sisters thinks she might have missed her seizure medication.   Pt was taken by EMS to ED.  In ED,  T afebrile  P 71  Bp 135/59  Pox 93-100% on RA,  Wt 61.2 kg  CT brain  IMPRESSION: Negative head CT. No intracranial mass, hemorrhage or edema.   UDS  + benzo, + marijuana Urinalysis negative  Na 139, K 3.2   Bun 14, Creatinine 0.91 Calcium 8.7, Alb 4.4 Ast 23, Alt 12 Wbc 16.0, hgb 11.8, Plt 218  MCV 102.7  Pt became agitated per ED and was given lorazepam 36m iv x3, and ns 10085mx1 and geodon 2033mm x1.     I spoke with her Sister PhySilva Bandy well as her husband to notify them of patients condition and that [facility] will be admitted for evaluation of AMS, ? Seizure.            Review of systems:    In addition to the HPI above,  No Fever-chills, No Headache, No changes with Vision or hearing, No problems swallowing food or Liquids, No Chest pain, Cough or Shortness of Breath, No Abdominal pain, No Nausea or Vomiting, bowel movements are regular, No Blood in stool or Urine, No dysuria, No new skin  rashes or bruises, No new joints pains-aches,  No new weakness, tingling, numbness in any extremity, No recent weight gain or loss, No polyuria, polydypsia or polyphagia, No significant Mental Stressors.  All other systems reviewed and are negative.    Past History of the following :    Past Medical History:  Diagnosis Date  . Anxiety   . Chiari malformation   . COPD (chronic obstructive pulmonary disease) ([facility] . Depression   . 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 IBS (irritable bowel syndrome)   . [place]   x 2  . Shingles 11/12/14   left eye      Past Surgical History:  Procedure Laterality Date  . ABDOMINAL HYSTERECTOMY     partial 2010-Dr.Ferguson  . I&D EXTREMITY Left 07/17/2014   Procedure: IRRIGATION AND DEBRIDEMENT left index  finger;  Surgeon: Leanora Cover, MD;  Location: [facility];  Service: Orthopedics;  Laterality: Left;  . NERVE, TENDON AND ARTERY REPAIR Left 07/17/2014   Procedure: NERVE, TENDON AND ARTERY REPAIR LEFT INDEX FINGER;  Surgeon: Leanora Cover, MD;  Location: [facility];  Service: Orthopedics;  Laterality: Left;  . PARTIAL HYSTERECTOMY  2006  . TUBAL LIGATION  1988      Social History:      Social History   Tobacco Use  . Smoking status: Current Every Day Smoker    Packs/day: 0.50    Years: 30.00    Pack years: 15.00    Types: Cigarettes  . Smokeless tobacco: Never Used  Substance Use Topics  . Alcohol use: Yes    Comment: occasionally, 11/25/14 once a week       Family History :     Family History  Problem Relation Age of Onset  . Hypertension Sister   . Depression Sister   . Drug abuse Sister   . Hypertension Mother   . Osteoporosis Mother   . Diabetes Mother   . Alcohol abuse Mother   . Depression Mother   . Heart disease Father        MI  . Depression Father   . Depression Sister   . Multiple sclerosis Sister   . Heart attack Sister   . Cancer - Lung Maternal Aunt   . Leukemia Maternal Aunt   . Colon  cancer Neg Hx        Home Medications:   Prior to Admission medications   Medication Sig Start Date End Date Taking? Authorizing Provider  ALPRAZolam (XANAX) 1 MG tablet TAKE 1/2 TABLET BY MOUTH TWICE DAILY as needed , AND 1 TABLET AT BEDTIME AS NEEDED. 05/15/18  Yes Luking, Elayne Snare, MD  DULoxetine (CYMBALTA) 60 MG capsule Take 1 capsule (60 mg total) by mouth daily. 05/15/18  Yes Luking, Elayne Snare, MD  levETIRAcetam (KEPPRA) 500 MG tablet TAKE (1) TABLET BY MOUTH TWICE DAILY. 04/09/18  Yes Luking, Elayne Snare, MD  albuterol (PROVENTIL HFA;VENTOLIN HFA) 108 (90 Base) MCG/ACT inhaler Inhale 2 puffs into the lungs every 6 (six) hours as needed for wheezing. Patient not taking: Reported on 07/21/2018 05/11/17   Kathyrn Drown, MD  ARIPiprazole (ABILIFY) 2 MG tablet TAKE ONE TABLET BY MOUTH ONCE DAILY. Patient not taking: Reported on 07/21/2018 07/01/18   Kathyrn Drown, MD     Allergies:     Allergies  Allergen Reactions  . Effexor [Venlafaxine Hcl] Other (See Comments)    Dizziness, pain behind eye  . Ampicillin Rash  . Neomycin Rash  . Penicillins Rash    Did it involve swelling of the face/tongue/throat, SOB, or low BP? Unknown Did it involve sudden or severe rash/hives, skin peeling, or any reaction on the inside of your mouth or nose? Unknown Did you need to seek medical attention at a hospital or doctor's office? Unknown When did it last happen? If all above answers are "NO", may proceed with cephalosporin use.      Physical Exam:   Vitals  Blood pressure 115/78, pulse 71, resp. rate 18, weight 61.2 kg, SpO2 100 %.  1.  General: somnolent  2. Psychiatric: Unable to assess due to somnolence  3. Neurologic: cn2-12 intact, reflexes 2+ symmetric, diffuse with downgoing toes bilaterally, pt moves all 4 ext in response to sternal rub  4. HEENMT:  Anicteric, pupils 1.49m symmetric, direct , consensual, near intact  5. Respiratory : CTAB  6. Cardiovascular : rrr s1, s2,  no m/g/r  7. Gastrointestinal:  Abd: soft, nt, nd, +bs   8. Skin:  Ext no c/c/e, normal turgor  9.Musculoskeletal:  Good ROM    Data Review:    CBC Recent Labs  Lab 07/21/18 1717  WBC 16.0*  HGB 11.8*  HCT 37.6  PLT 218  MCV 102.7*  MCH 32.2  MCHC 31.4  RDW 11.9  LYMPHSABS 1.4  MONOABS 1.2*  EOSABS 0.1  BASOSABS 0.1   ------------------------------------------------------------------------------------------------------------------  Results for orders placed or performed during the hospital encounter of 07/21/18 (from the past 48 hour(s))  Urinalysis, Routine w reflex microscopic     Status: Abnormal   Collection Time: 07/21/18  4:49 PM  Result Value Ref Range   Color, Urine YELLOW YELLOW   APPearance CLEAR CLEAR   Specific Gravity, Urine 1.017 1.005 - 1.030   pH 5.0 5.0 - 8.0   Glucose, UA NEGATIVE NEGATIVE mg/dL   Hgb urine dipstick SMALL (A) NEGATIVE   Bilirubin Urine NEGATIVE NEGATIVE   Ketones, ur NEGATIVE NEGATIVE mg/dL   Protein, ur 30 (A) NEGATIVE mg/dL   Nitrite NEGATIVE NEGATIVE   Leukocytes,Ua NEGATIVE NEGATIVE   RBC / HPF 0-5 0 - 5 RBC/hpf   WBC, UA 0-5 0 - 5 WBC/hpf   Bacteria, UA NONE SEEN NONE SEEN   Squamous Epithelial / LPF 0-5 0 - 5   Mucus PRESENT    Hyaline Casts, UA PRESENT     Comment: Performed at [facility], [address], [place], [place] [address]  Urine rapid drug screen (hosp performed)     Status: Abnormal   Collection Time: 07/21/18  4:49 PM  Result Value Ref Range   Opiates NONE DETECTED NONE DETECTED   Cocaine NONE DETECTED NONE DETECTED   Benzodiazepines POSITIVE (A) NONE DETECTED   Amphetamines NONE DETECTED NONE DETECTED   Tetrahydrocannabinol POSITIVE (A) NONE DETECTED   Barbiturates NONE DETECTED NONE DETECTED    Comment: (NOTE) DRUG SCREEN FOR MEDICAL PURPOSES ONLY.  IF CONFIRMATION IS NEEDED FOR ANY PURPOSE, NOTIFY LAB WITHIN 5 DAYS. LOWEST DETECTABLE LIMITS FOR URINE DRUG SCREEN Drug Class                      Cutoff (ng/mL) Amphetamine and metabolites    1000 Barbiturate and metabolites    200 Benzodiazepine                 382 Tricyclics and metabolites     300 Opiates and metabolites        300 Cocaine and metabolites        300 THC                            50 Performed at [facility], [address], [place], [place] [address]   POC CBG, ED     Status: Abnormal   Collection Time: 07/21/18  5:01 PM  Result Value Ref Range   Glucose-Capillary 157 (H) 70 - 99 mg/dL  CBC with Differential     Status: Abnormal   Collection Time: 07/21/18  5:17 PM  Result Value Ref Range   WBC 16.0 (H) 4.0 - 10.5 K/uL   RBC 3.66 (L) 3.87 - 5.11 MIL/uL   Hemoglobin 11.8 (L) 12.0 - 15.0 g/dL   HCT 37.6 36.0 - 46.0 %   MCV 102.7 (H) 80.0 - 100.0 fL   MCH 32.2 26.0 - 34.0  pg   MCHC 31.4 30.0 - 36.0 g/dL   RDW 11.9 11.5 - 15.5 %   Platelets 218 150 - 400 K/uL   nRBC 0.0 0.0 - 0.2 %   Neutrophils Relative % 82 %   Neutro Abs 13.1 (H) 1.7 - 7.7 K/uL   Lymphocytes Relative 9 %   Lymphs Abs 1.4 0.7 - 4.0 K/uL   Monocytes Relative 8 %   Monocytes Absolute 1.2 (H) 0.1 - 1.0 K/uL   Eosinophils Relative 0 %   Eosinophils Absolute 0.1 0.0 - 0.5 K/uL   Basophils Relative 0 %   Basophils Absolute 0.1 0.0 - 0.1 K/uL   Immature Granulocytes 1 %   Abs Immature Granulocytes 0.10 (H) 0.00 - 0.07 K/uL    Comment: Performed at [facility], [address], [place], [place] [address]  Comprehensive metabolic panel     Status: Abnormal   Collection Time: 07/21/18  5:17 PM  Result Value Ref Range   Sodium 139 135 - 145 mmol/L   Potassium 3.2 (L) 3.5 - 5.1 mmol/L   Chloride 108 98 - 111 mmol/L   CO2 24 22 - 32 mmol/L   Glucose, Bld 80 70 - 99 mg/dL   BUN 14 6 - 20 mg/dL   Creatinine, Ser 0.91 0.44 - 1.00 mg/dL   Calcium 8.7 (L) 8.9 - 10.3 mg/dL   Total Protein 6.8 6.5 - 8.1 g/dL   Albumin 4.4 3.5 - 5.0 g/dL   AST 23 15 - 41 U/L   ALT 12 0 - 44 U/L   Alkaline Phosphatase 52 38 - 126 U/L   Total Bilirubin 0.3  0.3 - 1.2 mg/dL   GFR calc non Af Amer >60 >60 mL/min   GFR calc Af Amer >60 >60 mL/min   Anion gap 7 5 - 15    Comment: Performed at [facility], [address], [place], [place] [address]  Ethanol     Status: None   Collection Time: 07/21/18  5:17 PM  Result Value Ref Range   Alcohol, Ethyl (B) <10 <10 mg/dL    Comment: (NOTE) Lowest detectable limit for serum alcohol is 10 mg/dL. For medical purposes only. Performed at [facility], [address], [place], [place] [address]     Chemistries  Recent Labs  Lab 07/21/18 1717  NA 139  K 3.2*  CL 108  CO2 24  GLUCOSE 80  BUN 14  CREATININE 0.91  CALCIUM 8.7*  AST 23  ALT 12  ALKPHOS 52  BILITOT 0.3   ------------------------------------------------------------------------------------------------------------------  ------------------------------------------------------------------------------------------------------------------ GFR: Estimated Creatinine Clearance: 69.1 mL/min (by C-G formula based on SCr of 0.91 mg/dL). Liver Function Tests: Recent Labs  Lab 07/21/18 1717  AST 23  ALT 12  ALKPHOS 52  BILITOT 0.3  PROT 6.8  ALBUMIN 4.4   No results for input(s): LIPASE, AMYLASE in the last 168 hours. No results for input(s): AMMONIA in the last 168 hours. Coagulation Profile: No results for input(s): INR, PROTIME in the last 168 hours. Cardiac Enzymes: No results for input(s): CKTOTAL, CKMB, CKMBINDEX, TROPONINI in the last 168 hours. BNP (last 3 results) No results for input(s): PROBNP in the last 8760 hours. HbA1C: No results for input(s): HGBA1C in the last 72 hours. CBG: Recent Labs  Lab 07/21/18 1701  GLUCAP 157*   Lipid Profile: No results for input(s): CHOL, HDL, LDLCALC, TRIG, CHOLHDL, LDLDIRECT in the last 72 hours. Thyroid Function Tests: No results for input(s): TSH, T4TOTAL, FREET4, T3FREE, THYROIDAB in the  last 72 hours. Anemia Panel: No results for input(s): VITAMINB12, FOLATE, FERRITIN,  TIBC, IRON, RETICCTPCT in the last 72 hours.  --------------------------------------------------------------------------------------------------------------- Urine analysis:    Component Value Date/Time   COLORURINE YELLOW 07/21/2018 1649   APPEARANCEUR CLEAR 07/21/2018 1649   LABSPEC 1.017 07/21/2018 1649   PHURINE 5.0 07/21/2018 1649   GLUCOSEU NEGATIVE 07/21/2018 1649   HGBUR SMALL (A) 07/21/2018 1649   BILIRUBINUR NEGATIVE 07/21/2018 1649   KETONESUR NEGATIVE 07/21/2018 1649   PROTEINUR 30 (A) 07/21/2018 1649   UROBILINOGEN 0.2 10/24/2014 0510   NITRITE NEGATIVE 07/21/2018 1649   LEUKOCYTESUR NEGATIVE 07/21/2018 1649      Imaging Results:    Ct Head Wo Contrast  Result Date: 07/21/2018 CLINICAL DATA:  Seizure, new, abnormal neuro exam, nontraumatic. Patient combative. EXAM: CT HEAD WITHOUT CONTRAST TECHNIQUE: Contiguous axial images were obtained from the base of the skull through the vertex without intravenous contrast. COMPARISON:  Head CT dated 10/23/2014. Brain MRI dated 10/23/2014. FINDINGS: Brain: Ventricles are within normal limits in size and configuration. All areas of the brain demonstrate appropriate gray-white matter attenuation. No mass, hemorrhage, edema or other evidence of acute parenchymal abnormality. No extra-axial hemorrhage. Vascular: No hyperdense vessel or unexpected calcification. Skull: Normal. Negative for fracture or focal lesion. Sinuses/Orbits: No acute finding. Other: None. IMPRESSION: Negative head CT. No intracranial mass, hemorrhage or edema. Electronically Signed   By: Franki Cabot M.D.   On: 07/21/2018 21:08   nsr at 95, nl axis, nl int, borderline st depression in v2, 3,     Assessment & Plan:    Principal Problem:   AMS (altered mental status) Active Problems:   Seizure (HCC)   Tobacco abuse   Marijuana abuse   Hypokalemia   Hypocalcemia  AMS, ? Seizure, medical noncompliance vs marijuana use Tele MRI brain EEG Check b12, esr, tsh,  rpr,  \ (prior ANA negative) keppra 1023m iv x1, 50442miv bid Ativan 42m53mv q6h prn seizure, anxiety Please consult neurology in AM  Hypokalemia Replete  Hypocalcemia Check ionized calcium Check magnesium  Anxiety/ Depression Due to sedation Hold xanax Hold Abilify Hold Cymbalta   ? slightly abnormal EKG Check trop I q6h x2 Repeat EKG in am  Nicotine dep Once wakes up, please ask if needs nicotine patch    DVT Prophylaxis-   Lovenox - SCDs  AM Labs Ordered, also please review Full Orders  Family Communication: Admission, patients condition and plan of care including tests being ordered have been discussed with the patient's sister and husband who indicate understanding and agree with the plan and Code Status.  Code Status:  FULL CODE  Admission status: Observation/  Based on patients clinical presentation and evaluation of above clinical data, I have made determination that patient meets observation criteria at this time for AMS secondary to ? Seizure, hypokalemia, and hypocalcemia.   Time spent in minutes : 70 minutes   JamJani GravelD on 07/21/2018 at 10:38 PM

## 2018-07-21 NOTE — ED Notes (Signed)
Pt transported to CT with 2 person assist, patient continues to try and get up out of bed and kick and hit staff.

## 2018-07-21 NOTE — ED Provider Notes (Signed)
[facility]Y [facility] Provider Note   CSN: [id number] Arrival date & time: 07/21/18  1609    History   Chief Complaint Chief Complaint  Patient presents with  . Seizures    HPI [name] is a 53 y.o. female.     Level 5 caveat for acuity of condition.  Patient has a known seizure disorder and allegedly had a tonic-clonic seizure after mowing the lawn at her sister's house.  No known prodromal illnesses.  She was given Versed 2.5 mg IV via EMS.  CBG 109.  Unknown whether she is taking her seizure medication.     Past Medical History:  Diagnosis Date  . Anxiety   . Chiari malformation   . COPD (chronic obstructive pulmonary disease) (HCC)   . Depression   . 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 IBS (irritable bowel syndrome)   . Seizures (HCC) 10/23/14   x 2  . Shingles 11/12/14   left eye    Patient Active Problem List   Diagnosis Date Noted  . Aortic atherosclerosis (HCC) 05/30/2017  . Irritable bowel syndrome with both constipation and diarrhea 04/07/2017  . Raynaud phenomenon 12/15/2015  . Mild single current episode of major depressive disorder (HCC) 06/18/2015  . Generalized anxiety disorder with panic attacks 06/18/2015  . Insomnia 05/12/2015  . Tobacco abuse 05/12/2015  . Herpes zoster 11/14/2014  . Chiari malformation type I (HCC) 11/06/2014  . Seizure (HCC) 10/23/2014  . Seasonal affective disorder (HCC) 01/08/2014  . Fecal incontinence 06/26/2013  . COPD (chronic obstructive pulmonary disease) (HCC) 12/23/2012  . Dizziness 10/22/2012    Past Surgical History:  Procedure Laterality Date  . ABDOMINAL HYSTERECTOMY     partial 2010-Dr.Ferguson  . I&D EXTREMITY Left 07/17/2014   Procedure: IRRIGATION AND DEBRIDEMENT left index finger;  Surgeon: Betha LoaKevin Kuzma, MD;  [facility];  Service: Orthopedics;  Laterality: Left;  . NERVE, TENDON AND ARTERY REPAIR Left 07/17/2014   Procedure: NERVE, TENDON AND ARTERY REPAIR LEFT INDEX FINGER;   Surgeon: Betha LoaKevin Kuzma, MD;  Location: [facility] OR;  Service: Orthopedics;  Laterality: Left;  . PARTIAL HYSTERECTOMY  2006  . TUBAL LIGATION  1988     OB History   No obstetric history on file.      Home Medications    Prior to Admission medications   Medication Sig Start Date End Date Taking? Authorizing Provider  ALPRAZolam (XANAX) 1 MG tablet TAKE 1/2 TABLET BY MOUTH TWICE DAILY as needed , AND 1 TABLET AT BEDTIME AS NEEDED. 05/15/18  Yes Luking, Jonna CoupScott A, MD  DULoxetine (CYMBALTA) 60 MG capsule Take 1 capsule (60 mg total) by mouth daily. 05/15/18  Yes Luking, Jonna CoupScott A, MD  levETIRAcetam (KEPPRA) 500 MG tablet TAKE (1) TABLET BY MOUTH TWICE DAILY. 04/09/18  Yes Luking, Jonna CoupScott A, MD  albuterol (PROVENTIL HFA;VENTOLIN HFA) 108 (90 Base) MCG/ACT inhaler Inhale 2 puffs into the lungs every 6 (six) hours as needed for wheezing. Patient not taking: Reported on 07/21/2018 05/11/17   Babs SciaraLuking, Scott A, MD  ARIPiprazole (ABILIFY) 2 MG tablet TAKE ONE TABLET BY MOUTH ONCE DAILY. Patient not taking: Reported on 07/21/2018 07/01/18   Babs SciaraLuking, Scott A, MD    Family History Family History  Problem Relation Age of Onset  . Hypertension Sister   . Depression Sister   . Drug abuse Sister   . Hypertension Mother   . Osteoporosis Mother   . Diabetes Mother   . Alcohol abuse Mother   . Depression  Mother   . Heart disease Father        MI  . Depression Father   . Depression Sister   . Multiple sclerosis Sister   . Heart attack Sister   . Cancer - Lung Maternal Aunt   . Leukemia Maternal Aunt   . Colon cancer Neg Hx     Social History Social History   Tobacco Use  . Smoking status: Current Every Day Smoker    Packs/day: 0.50    Years: 30.00    Pack years: 15.00    Types: Cigarettes  . Smokeless tobacco: Never Used  Substance Use Topics  . Alcohol use: Yes    Comment: occasionally, 11/25/14 once a week  . Drug use: Yes    Types: Marijuana     Allergies   Effexor [venlafaxine hcl]; Ampicillin;  Neomycin; and Penicillins   Review of Systems Review of Systems  Unable to perform ROS: Acuity of condition     Physical Exam Updated Vital Signs BP 115/69   Pulse 71   Resp 19   Wt 61.2 kg   SpO2 99%   BMI 21.14 kg/m   Physical Exam Vitals signs and nursing note reviewed.  Constitutional:      Appearance: She is well-developed.     Comments: sleepy  HENT:     Head: Normocephalic and atraumatic.  Eyes:     Conjunctiva/sclera: Conjunctivae normal.  Neck:     Musculoskeletal: Neck supple.  Cardiovascular:     Rate and Rhythm: Normal rate and regular rhythm.  Pulmonary:     Effort: Pulmonary effort is normal.     Breath sounds: Normal breath sounds.  Abdominal:     General: Bowel sounds are normal.     Palpations: Abdomen is soft.  Musculoskeletal:     Comments: Moving all extremities  Skin:    General: Skin is warm and dry.  Neurological:     Comments: Disoriented  Psychiatric:     Comments: Postictal      ED Treatments / Results  Labs (all labs ordered are listed, but only abnormal results are displayed) Labs Reviewed  CBC WITH DIFFERENTIAL/PLATELET - Abnormal; Notable for the following components:      Result Value   WBC 16.0 (*)    RBC 3.66 (*)    Hemoglobin 11.8 (*)    MCV 102.7 (*)    Neutro Abs 13.1 (*)    Monocytes Absolute 1.2 (*)    Abs Immature Granulocytes 0.10 (*)    All other components within normal limits  COMPREHENSIVE METABOLIC PANEL - Abnormal; Notable for the following components:   Potassium 3.2 (*)    Calcium 8.7 (*)    All other components within normal limits  URINALYSIS, ROUTINE W REFLEX MICROSCOPIC - Abnormal; Notable for the following components:   Hgb urine dipstick SMALL (*)    Protein, ur 30 (*)    All other components within normal limits  RAPID URINE DRUG SCREEN, HOSP PERFORMED - Abnormal; Notable for the following components:   Benzodiazepines POSITIVE (*)    Tetrahydrocannabinol POSITIVE (*)    All other  components within normal limits  CBG MONITORING, ED - Abnormal; Notable for the following components:   Glucose-Capillary 157 (*)    All other components within normal limits  ETHANOL    EKG EKG Interpretation  Date/Time:  Saturday July 21 2018 16:15:26 EDT Ventricular Rate:  96 PR Interval:    QRS Duration: 98 QT Interval:  380 QTC Calculation: 481  R Axis:   54 Text Interpretation:  Sinus rhythm Borderline ST depression, anterolateral leads Confirmed by Donnetta Hutching ([id number]) on 07/21/2018 4:38:54 PM   Radiology Ct Head Wo Contrast  Result Date: 07/21/2018 CLINICAL DATA:  Seizure, new, abnormal neuro exam, nontraumatic. Patient combative. EXAM: CT HEAD WITHOUT CONTRAST TECHNIQUE: Contiguous axial images were obtained from the base of the skull through the vertex without intravenous contrast. COMPARISON:  Head CT dated 10/23/2014. Brain MRI dated 10/23/2014. FINDINGS: Brain: Ventricles are within normal limits in size and configuration. All areas of the brain demonstrate appropriate gray-white matter attenuation. No mass, hemorrhage, edema or other evidence of acute parenchymal abnormality. No extra-axial hemorrhage. Vascular: No hyperdense vessel or unexpected calcification. Skull: Normal. Negative for fracture or focal lesion. Sinuses/Orbits: No acute finding. Other: None. IMPRESSION: Negative head CT. No intracranial mass, hemorrhage or edema. Electronically Signed   By: Bary Richard M.D.   On: 07/21/2018 21:08    Procedures Procedures (including critical care time)  Medications Ordered in ED Medications  LORazepam (ATIVAN) injection 2 mg (2 mg Intravenous Given 07/21/18 2050)  sodium chloride 0.9 % bolus 1,000 mL (0 mLs Intravenous Stopped 07/21/18 1802)  LORazepam (ATIVAN) injection 2 mg (2 mg Intravenous Given 07/21/18 1711)  sterile water (preservative free) injection (1.2 mLs  Given 07/21/18 1746)  ziprasidone (GEODON) injection 20 mg (20 mg Intramuscular Given 07/21/18 1746)   LORazepam (ATIVAN) injection 2 mg (2 mg Intravenous Given 07/21/18 1817)     Initial Impression / Assessment and Plan / ED Course  I have reviewed the triage vital signs and the nursing notes.  Pertinent labs & imaging results that were available during my care of the patient were reviewed by me and considered in my medical decision making (see chart for details).        Patient allegedly had a seizure earlier today.  Emergency department, she was very sleepy initially.  She then became erratic and combative.  She required several doses of Ativan and IM Geodon.  Arm and leg restraints initiated.  CT head shows no acute findings.  Will admit for further management.   CRITICAL CARE Performed by: Donnetta Hutching Total critical care time: 40 minutes Critical care time was exclusive of separately billable procedures and treating other patients. Critical care was necessary to treat or prevent imminent or life-threatening deterioration. Critical care was time spent personally by me on the following activities: development of treatment plan with patient and/or surrogate as well as nursing, discussions with consultants, evaluation of patient's response to treatment, examination of patient, obtaining history from patient or surrogate, ordering and performing treatments and interventions, ordering and review of laboratory studies, ordering and review of radiographic studies, pulse oximetry and re-evaluation of patient's condition.  Final Clinical Impressions(s) / ED Diagnoses   Final diagnoses:  Seizure (HCC)  Altered mental status, unspecified altered mental status type    ED Discharge Orders    None       Donnetta Hutching, MD 07/21/18 2141

## 2018-07-21 NOTE — ED Notes (Signed)
Attempted to transport pt to CT scan at this time. PT sitting up in bed and hitting at staff and is confused and trying to get out of bed and unable to follow commands. PT back to room at this time with EDP at bedside and verbal order for Geodon 20mg  IM at this time. Will attempt to transport back to CT momentarily.

## 2018-07-21 NOTE — ED Triage Notes (Addendum)
Per EMS, called out for witnessed seizure by sister. EMS states upon arrival, pt was post-ictal. Pt would look around, but would not speak. While EMS in home, pt had another seizure that lasted around 30 seconds. Pt's sats dropped to 89% and supplemental oxygen was given. Pt was given 2.5 mg versed on scene. Pt unresponsive to stimuli and snoring.Hx of seizures. CBG 109.

## 2018-07-21 NOTE — ED Notes (Signed)
Pt became very combative when trying to connect fluids.  Removed her cardiac monitor and attempting to remove IV.  Pt hitting staff, biting, and kicking.  Unable to redirect or orient.  Continues to babble incoherently.  Dr. Adriana Simas at bedside assisting to restrain patient and gave verbal order for restraints.  Restraints applied without difficulty.

## 2018-07-21 NOTE — ED Notes (Signed)
Pt continues to be combative and aggressive towards staff. Pt still confused. Pt biting at staff, kicking, and sitting up despite restraints. Verbal order for geodon give by EDP. Given by this RN. Pt still fighting. Mitts placed on pt's hands.

## 2018-07-21 NOTE — ED Notes (Signed)
Please call husband Lovinia Wiltse [phone] for any new updates.

## 2018-07-22 ENCOUNTER — Observation Stay (HOSPITAL_BASED_OUTPATIENT_CLINIC_OR_DEPARTMENT_OTHER): Payer: Self-pay

## 2018-07-22 ENCOUNTER — Observation Stay (HOSPITAL_COMMUNITY): Payer: Self-pay

## 2018-07-22 DIAGNOSIS — R4182 Altered mental status, unspecified: Secondary | ICD-10-CM

## 2018-07-22 DIAGNOSIS — R569 Unspecified convulsions: Secondary | ICD-10-CM

## 2018-07-22 DIAGNOSIS — F1210 Cannabis abuse, uncomplicated: Secondary | ICD-10-CM

## 2018-07-22 DIAGNOSIS — R509 Fever, unspecified: Secondary | ICD-10-CM

## 2018-07-22 DIAGNOSIS — I361 Nonrheumatic tricuspid (valve) insufficiency: Secondary | ICD-10-CM

## 2018-07-22 LAB — COMPREHENSIVE METABOLIC PANEL
ALT: 12 U/L (ref 0–44)
AST: 26 U/L (ref 15–41)
Albumin: 3.7 g/dL (ref 3.5–5.0)
Alkaline Phosphatase: 44 U/L (ref 38–126)
Anion gap: 10 (ref 5–15)
BUN: 11 mg/dL (ref 6–20)
CO2: 23 mmol/L (ref 22–32)
Calcium: 9.2 mg/dL (ref 8.9–10.3)
Chloride: 107 mmol/L (ref 98–111)
Creatinine, Ser: 0.85 mg/dL (ref 0.44–1.00)
GFR calc Af Amer: >60 mL/min (ref >60)
GFR calc non Af Amer: >60 mL/min (ref >60)
Glucose, Bld: 104 mg/dL — ABNORMAL HIGH (ref 70–99)
Potassium: 3.8 mmol/L (ref 3.5–5.1)
Sodium: 140 mmol/L (ref 135–145)
Total Bilirubin: 0.7 mg/dL (ref 0.3–1.2)
Total Protein: 5.7 g/dL — ABNORMAL LOW (ref 6.5–8.1)

## 2018-07-22 LAB — CBC
HCT: 34.8 % — ABNORMAL LOW (ref 36.0–46.0)
Hemoglobin: 11.3 g/dL — ABNORMAL LOW (ref 12.0–15.0)
MCH: 31.6 pg (ref 26.0–34.0)
MCHC: 32.5 g/dL (ref 30.0–36.0)
MCV: 97.2 fL (ref 80.0–100.0)
Platelets: 196 10*3/uL (ref 150–400)
RBC: 3.58 MIL/uL — ABNORMAL LOW (ref 3.87–5.11)
RDW: 11.9 % (ref 11.5–15.5)
WBC: 11.2 10*3/uL — ABNORMAL HIGH (ref 4.0–10.5)
nRBC: 0 % (ref 0.0–0.2)

## 2018-07-22 LAB — RPR: RPR Ser Ql: NONREACTIVE

## 2018-07-22 LAB — TROPONIN I
Troponin I: 0.24 ng/mL (ref <0.03)
Troponin I: 0.51 ng/mL (ref <0.03)

## 2018-07-22 LAB — VITAMIN B12: Vitamin B-12: 173 pg/mL — ABNORMAL LOW (ref 180–914)

## 2018-07-22 LAB — SEDIMENTATION RATE: Sed Rate: 5 mm/hr (ref 0–22)

## 2018-07-22 LAB — HIV ANTIBODY (ROUTINE TESTING W REFLEX): HIV Screen 4th Generation wRfx: NONREACTIVE

## 2018-07-22 LAB — ECHOCARDIOGRAM LIMITED: Weight: 2000.01 oz

## 2018-07-22 LAB — TSH: TSH: 1.947 u[IU]/mL (ref 0.350–4.500)

## 2018-07-22 MED ORDER — ACETAMINOPHEN 650 MG RE SUPP: 650.0000 mg | Freq: Four times a day (QID) | RECTAL | Status: DC | PRN

## 2018-07-22 MED ORDER — ACETAMINOPHEN 325 MG PO TABS
650.0000 mg | ORAL_TABLET | Freq: Four times a day (QID) | ORAL | Status: DC | PRN
  Administered 2018-07-22: 03:00:00 650 mg via ORAL
  Filled 2018-07-22: qty 2

## 2018-07-22 MED ORDER — SODIUM CHLORIDE 0.9 % IV SOLN
2.0000 g | Freq: Three times a day (TID) | INTRAVENOUS | Status: DC
  Administered 2018-07-22 – 2018-07-23 (×3): 2 g via INTRAVENOUS
  Filled 2018-07-22 (×6): qty 2

## 2018-07-22 MED ORDER — CYANOCOBALAMIN 1000 MCG/ML IJ SOLN
1000.0000 ug | Freq: Once | INTRAMUSCULAR | Status: AC
  Administered 2018-07-22: 1000 ug via INTRAMUSCULAR
  Filled 2018-07-22: qty 1

## 2018-07-22 MED ORDER — ENOXAPARIN SODIUM 40 MG/0.4ML ~~LOC~~ SOLN
40.0000 mg | Freq: Every day | SUBCUTANEOUS | Status: DC
  Administered 2018-07-22: 11:00:00 40 mg via SUBCUTANEOUS
  Filled 2018-07-22: qty 0.4

## 2018-07-22 MED ORDER — DEXTROSE 5 % IV SOLN
10.0000 mg/kg | Freq: Three times a day (TID) | INTRAVENOUS | Status: DC
  Administered 2018-07-22 – 2018-07-23 (×3): 565 mg via INTRAVENOUS
  Filled 2018-07-22 (×6): qty 11.3
  Filled 2018-07-22: qty 10

## 2018-07-22 MED ORDER — VANCOMYCIN HCL 10 G IV SOLR
1250.0000 mg | INTRAVENOUS | Status: DC
  Administered 2018-07-23: 10:00:00 1250 mg via INTRAVENOUS
  Filled 2018-07-22 (×2): qty 1250

## 2018-07-22 MED ORDER — VANCOMYCIN HCL 10 G IV SOLR
1250.0000 mg | Freq: Once | INTRAVENOUS | Status: AC
  Administered 2018-07-22: 10:00:00 1250 mg via INTRAVENOUS
  Filled 2018-07-22: qty 1250

## 2018-07-22 MED ORDER — SODIUM CHLORIDE 0.9 % IV SOLN
INTRAVENOUS | Status: AC
  Administered 2018-07-22: 03:00:00 via INTRAVENOUS

## 2018-07-22 NOTE — ED Notes (Signed)
Pts sister Barnetta Hammersmith phone number [phone]

## 2018-07-22 NOTE — Progress Notes (Signed)
Received pt from [facility], alert but drowsy, lethargic with some confusion, placed on Tele and verified, no call light in reach, MD orders were followed, monitoring continues.

## 2018-07-22 NOTE — Progress Notes (Signed)
Xcover Called by RN regarding troponin elevation Pt doesn't c/o chest pain or dyspnea.   A/P Elevated Trop ? Secondary to seizure.  Cont tele Check cpk, mb Check trop I q6hx3 Check cardiac echo

## 2018-07-22 NOTE — Progress Notes (Signed)
  Echocardiogram 2D Echocardiogram has been performed.  Limited echocardiogram performed per the Director's protocol to limit patient to technician exposure during COVID 19.  [name] 07/22/2018, 10:18 AM

## 2018-07-22 NOTE — Progress Notes (Signed)
Progress Note    [name]  JYN:[id number] DOB: 09/15/1965  DOA: 07/21/2018 PCP: Babs Sciara, MD    Brief Narrative:     Medical records reviewed and are as summarized below:  [name] is an 53 y.o. female w  Seizure do, Copd, IBS, Anxiety/ Depression who apparently presented with seizure while at sisters house. Pt had seizure for about 5 minutes per sister. Glazed over eyes, and seemed confused.  R arm was shaking a little.  No tongue bite, no jerking motions with her legs, no incontinence.  Her sister called EMS and was brought to ED. Pt became agitated per ED and was given lorazepam  iv x3, and ns x1 and geodon  im x1.    Assessment/Plan:   Principal Problem:   AMS (altered mental status) Active Problems:   Seizure (HCC)   Tobacco abuse   Marijuana abuse   Hypokalemia   Hypocalcemia  AMS with fever -? meningitis ?seizure MRI brain w/o CVA EEG keppra  iv x1,  iv bid Ativan  iv q6h prn seizure, anxiety Neurology consult appreciated -got lovenox this AM so not able to get LP -will start broad spectrum abx for now -no known COVID 19 exposures; no cough, no hypoxia  Hypokalemia Replete  Low B12 -replete  Marijuana abuse -UDS +  Elevated troponin -echo shows: Severe anterior mitral leaflet myxomatous prolapse, with at least moderate mitral insufficiency and with preserved left heart chamber sizes and function. The mitral insufficiency was highly eccentric and Doppler assessment was insufficient. The MR is probably not severe. Consider inpatient TEE only if this is will have direct impact on current care. Otherwise, consider future outpatient TEE to clarify severity of mitral insufficiency. -recheck echo  Family Communication/Anticipated D/C date and plan/Code Status   DVT prophylaxis: Lovenox ordered. Code Status: Full Code.  Family Communication:  Disposition Plan: pending work up   Medical Consultants:      Neuro     Subjective:   Buried under covers  Objective:    Vitals:   07/22/18 0221 07/22/18 0400 07/22/18 0500 07/22/18 0827  BP: 91/68   (!) 99/48  Pulse: 71   90  Resp: 18   14  Temp: (!) 100.8 F (38.2 C) 99.1 F (37.3 C)  100.1 F (37.8 C)  TempSrc: Oral Oral  Oral  SpO2: 97%   97%  Weight:   56.7 kg     Intake/Output Summary (Last 24 hours) at 07/22/2018 1210 Last data filed at 07/22/2018 0400 Gross per 24 hour  Intake 1103.3 ml  Output --  Net 1103.3 ml   Filed Weights   07/21/18 1615 07/22/18 0500  Weight: 61.2 kg 56.7 kg    Exam: Knows name and place Does not follow commands rrr No increased work of breathing, lungs clear  Data Reviewed:   I have personally reviewed following labs and imaging studies:  Labs: Labs show the following:   Basic Metabolic Panel: Recent Labs  Lab 07/21/18 1717 07/22/18 0452  NA 139 140  K 3.2* 3.8  CL 108 107  CO2 24 23  GLUCOSE 80 104*  BUN 14 11  CREATININE 0.91 0.85  CALCIUM 8.7* 9.2  MG 2.2  --    GFR Estimated Creatinine Clearance: 68.5 mL/min (by C-G formula based on SCr of 0.85 mg/dL). Liver Function Tests: Recent Labs  Lab 07/21/18 1717 07/22/18 0452  AST 23 26  ALT 12 12  ALKPHOS 52 44  BILITOT 0.3  0.7  PROT 6.8 5.7*  ALBUMIN 4.4 3.7   No results for input(s): LIPASE, AMYLASE in the last 168 hours. No results for input(s): AMMONIA in the last 168 hours. Coagulation profile No results for input(s): INR, PROTIME in the last 168 hours.  CBC: Recent Labs  Lab 07/21/18 1717 07/22/18 0452  WBC 16.0* 11.2*  NEUTROABS 13.1*  --   HGB 11.8* 11.3*  HCT 37.6 34.8*  MCV 102.7* 97.2  PLT 218 196   Cardiac Enzymes: Recent Labs  Lab 07/21/18 2356  TROPONINI 0.51*   BNP (last 3 results) No results for input(s): PROBNP in the last 8760 hours. CBG: Recent Labs  Lab 07/21/18 1701  GLUCAP 157*   D-Dimer: No results for input(s): DDIMER in the last 72 hours. Hgb A1c: No  results for input(s): HGBA1C in the last 72 hours. Lipid Profile: No results for input(s): CHOL, HDL, LDLCALC, TRIG, CHOLHDL, LDLDIRECT in the last 72 hours. Thyroid function studies: Recent Labs    07/22/18 0452  TSH 1.947   Anemia work up: Recent Labs    07/22/18 0452  VITAMINB12 173*   Sepsis Labs: Recent Labs  Lab 07/21/18 1717 07/22/18 0452  WBC 16.0* 11.2*    Microbiology No results found for this or any previous visit (from the past 240 hour(s)).  Procedures and diagnostic studies:  Ct Head Wo Contrast  Result Date: 07/21/2018 CLINICAL DATA:  Seizure, new, abnormal neuro exam, nontraumatic. Patient combative. EXAM: CT HEAD WITHOUT CONTRAST TECHNIQUE: Contiguous axial images were obtained from the base of the skull through the vertex without intravenous contrast. COMPARISON:  Head CT dated 10/23/2014. Brain MRI dated 10/23/2014. FINDINGS: Brain: Ventricles are within normal limits in size and configuration. All areas of the brain demonstrate appropriate gray-white matter attenuation. No mass, hemorrhage, edema or other evidence of acute parenchymal abnormality. No extra-axial hemorrhage. Vascular: No hyperdense vessel or unexpected calcification. Skull: Normal. Negative for fracture or focal lesion. Sinuses/Orbits: No acute finding. Other: None. IMPRESSION: Negative head CT. No intracranial mass, hemorrhage or edema. Electronically Signed   By: Bary RichardStan  Maynard M.D.   On: 07/21/2018 21:08   Mr Brain Wo Contrast  Result Date: 07/22/2018 CLINICAL DATA:  Altered mental status EXAM: MRI HEAD WITHOUT CONTRAST TECHNIQUE: Multiplanar, multiecho pulse sequences of the brain and surrounding structures were obtained without intravenous contrast. COMPARISON:  CT head 07/21/2018. FINDINGS: Brain: Incomplete study. The patient was not able to complete the study. Axial T2 images significantly degraded by motion. Negative for acute infarct. Negative for hemorrhage mass or edema. No midline  shift. Small hyperintensity right frontal white matter likely due to chronic ischemia. Brainstem normal. Vascular: Normal arterial flow voids Skull and upper cervical spine: Negative Sinuses/Orbits: Negative Other: None IMPRESSION: Incomplete study.  No acute or significant abnormality is detected. Note is made of mild Chiari malformation on prior MRI from 10/23/2014. Sagittal images not performed today. Electronically Signed   By: Marlan Palauharles  Clark M.D.   On: 07/22/2018 09:32   Dg Chest Port 1 View  Result Date: 07/21/2018 CLINICAL DATA:  Altered mental status. EXAM: PORTABLE CHEST 1 VIEW COMPARISON:  Radiographs of Aug 25, 2009. FINDINGS: The heart size and mediastinal contours are within normal limits. Both lungs are clear. The visualized skeletal structures are unremarkable. IMPRESSION: No active disease. Electronically Signed   By: Lupita RaiderJames  Green Jr, M.D.   On: 07/21/2018 23:06    Medications:    enoxaparin (LOVENOX) injection  40 mg Subcutaneous Daily   Continuous Infusions:  acyclovir  levETIRAcetam     meropenem (MERREM) IV     [START ON 07/23/2018] vancomycin       LOS: 0 days   Joseph Art  [facility]   How to contact the [facility] Attending or Consulting provider 7A - 7P or covering provider during after hours 7P -7A, for this patient?  1. Check the care team in [facility] and look for a) attending/consulting [facility] provider listed and b) the [facility] team listed 2. Log into [email or web address] and use [place]'s universal password to access. If you do not have the password, please contact the hospital operator. 3. Locate the [facility] provider you are looking for under [facility] and page to a number that you can be directly reached. 4. If you still have difficulty reaching the provider, please page the [facility] (Director on Call) for the Hospitalists listed on amion for assistance.  07/22/2018, 12:10 PM

## 2018-07-22 NOTE — Progress Notes (Signed)
Attempted to get pt for MRI and the rn was not able to wake up the pt, so he asked that we check back later in the am.

## 2018-07-22 NOTE — Consult Note (Addendum)
NEURO HOSPITALIST CONSULT NOTE   Requestig physician: Dr. Benjamine Mola   Reason for Consult: seizure/ AMS   History obtained from:  Patient    HPI:                                                                                                                                          [name] is an 53 y.o. female  With PMH seizures ( 10/2014), COPD, anxiety, depression, chiari malformation who presented to [facility] ed on 07/21/2018 for seizure. Neurology consulted on 07/22/2018 for AMS.    patient was at her sisters house yesterday when she had a seizure that lasted about 5 minutes. Her eyes became glazed over and she seemed confused. Her right arm was shaking a little. Denies tongue bites, jerking motions in the legs, incontinence. Patient admitted that she has been taking only 500 mg of Keppra per day for awhile, when she is prescribed 1000 mg of keppra per day. She states that she has been busy taking care of family, and that she thought she was better because she had not had a seizure in a long time. Admits to smoking 1/4- 1/2 pack of cigarettes per day, social drinking. Denies drug use any recent sick contacts, cough. N/v diarrhea.  Her seizures normally she becomes dizzy , becomes hot, and then loses consciousness.   Patient is a patient of Dr. Marjory Lies last seen 08/25/15: at that time her seizures were well controlled. Keppra 500 mg BID.  Previous EEG and MRI unremarkable.   Hospital course: 07/21/2018: admitted for seizure, CTH: no hemorrhage or acute abnormality UDS: + benzo, + marijuana UA: - UTI was given 3 mg ativan x3, NS bolus, geodon 20 mg IM x 1. rEEG: pending ( patient in MRI) MRI: incomplete MRI , but no acute finding on MRI, prior chiari malformation.  07/22/2018: neurology consulted or AMS.  Past Medical History:  Diagnosis Date  . Anxiety   . Chiari malformation   . COPD (chronic obstructive pulmonary disease) (HCC)   . Depression   . 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 IBS (irritable bowel syndrome)   . Seizures (HCC) 10/23/14   x 2  . Shingles 11/12/14   left eye    Past Surgical History:  Procedure Laterality Date  . ABDOMINAL HYSTERECTOMY     partial 2010-Dr.Ferguson  . I&D EXTREMITY Left 07/17/2014   Procedure: IRRIGATION AND DEBRIDEMENT left index finger;  Surgeon: Betha Loa, MD;  Location: [facility] OR;  Service: Orthopedics;  Laterality: Left;  . NERVE, TENDON AND ARTERY REPAIR Left 07/17/2014   Procedure: NERVE, TENDON AND ARTERY REPAIR LEFT INDEX FINGER;  Surgeon: Betha Loa, MD;  Location: [facility] OR;  Service: Orthopedics;  Laterality: Left;  . PARTIAL HYSTERECTOMY  2006  .  TUBAL LIGATION  1988    Family History  Problem Relation Age of Onset  . Hypertension Sister   . Depression Sister   . Drug abuse Sister   . Hypertension Mother   . Osteoporosis Mother   . Diabetes Mother   . Alcohol abuse Mother   . Depression Mother   . Heart disease Father        MI  . Depression Father   . Depression Sister   . Multiple sclerosis Sister   . Heart attack Sister   . Cancer - Lung Maternal Aunt   . Leukemia Maternal Aunt   . Colon cancer Neg Hx             Social History:  reports that she has been smoking cigarettes. She has a 15.00 pack-year smoking history. She has never used smokeless tobacco. She reports current alcohol use. She reports current drug use. Drug: Marijuana.  Allergies  Allergen Reactions  . Effexor [Venlafaxine Hcl] Other (See Comments)    Dizziness, pain behind eye  . Ampicillin Rash  . Neomycin Rash  . Penicillins Rash    Did it involve swelling of the face/tongue/throat, SOB, or low BP? Unknown Did it involve sudden or severe rash/hives, skin peeling, or any reaction on the inside of your mouth or nose? Unknown Did you need to seek medical attention at a hospital or doctor's office? Unknown When did it last happen? If all above answers are "NO", may proceed with cephalosporin use.      MEDICATIONS:                                                                                                                     Scheduled:  Continuous: . acyclovir    . levETIRAcetam    . meropenem (MERREM) IV    . [START ON 07/23/2018] vancomycin     ZOX:[id number]:acetaminophen **OR** acetaminophen, LORazepam, LORazepam   ROS:                                                                                                                                       ROS was performed and is negative except as noted in HPI   Blood pressure 103/62, pulse 65, temperature 98.8 F (37.1 C), temperature source Oral, resp. rate 15, weight 56.7 kg, SpO2 96 %.   General Examination:  Physical Exam  HEENT-  Normocephalic, no lesions, without obvious abnormality.  Normal external eye and conjunctiva.   Cardiovascular- S1-S2 audible, pulses palpable throughout   Lungs-no rhonchi or wheezing noted, no excessive working breathing.  Saturations within normal limits on RA Extremities- Warm, dry and intact Musculoskeletal-no joint tenderness, deformity or swelling Skin-warm and dry, no hyperpigmentation, vitiligo, or suspicious lesions  Neurological Examination Mental Status: Alert, oriented, thought content appropriate.  Speech fluent without evidence of aphasia.  Able to follow commands without difficulty. No neck stiffness or rigidity. Cranial Nerves: II:  Visual fields grossly normal,  III,IV, VI: ptosis not present, extra-ocular motions intact bilaterally pupils equal, round, reactive to light and accommodation V,VII: smile symmetric, facial light touch sensation normal bilaterally VIII: hearing normal bilaterally IX,X: uvula rises symmetrically XI: bilateral shoulder shrug XII: midline tongue extension Motor: Right : Upper extremity   5/5  Left:     Upper extremity   5/5  Lower extremity    5/5   Lower extremity   5/5 Tone and bulk:normal tone throughout; no atrophy noted Sensory: light touch intact throughout, bilaterally Deep Tendon Reflexes: 2+ and symmetric throughout Plantars: Right: downgoing   Left: downgoing Cerebellar: normal finger-to-nose,  normal heel-to-shin test Gait: deferred   Lab Results: Basic Metabolic Panel: Recent Labs  Lab 07/21/18 1717 07/22/18 0452  NA 139 140  K 3.2* 3.8  CL 108 107  CO2 24 23  GLUCOSE 80 104*  BUN 14 11  CREATININE 0.91 0.85  CALCIUM 8.7* 9.2  MG 2.2  --     CBC: Recent Labs  Lab 07/21/18 1717 07/22/18 0452  WBC 16.0* 11.2*  NEUTROABS 13.1*  --   HGB 11.8* 11.3*  HCT 37.6 34.8*  MCV 102.7* 97.2  PLT 218 196    Cardiac Enzymes: Recent Labs  Lab 07/21/18 2356  TROPONINI 0.51*    Imaging: Ct Head Wo Contrast  Result Date: 07/21/2018 CLINICAL DATA:  Seizure, new, abnormal neuro exam, nontraumatic. Patient combative. EXAM: CT HEAD WITHOUT CONTRAST TECHNIQUE: Contiguous axial images were obtained from the base of the skull through the vertex without intravenous contrast. COMPARISON:  Head CT dated 10/23/2014. Brain MRI dated 10/23/2014. FINDINGS: Brain: Ventricles are within normal limits in size and configuration. All areas of the brain demonstrate appropriate gray-white matter attenuation. No mass, hemorrhage, edema or other evidence of acute parenchymal abnormality. No extra-axial hemorrhage. Vascular: No hyperdense vessel or unexpected calcification. Skull: Normal. Negative for fracture or focal lesion. Sinuses/Orbits: No acute finding. Other: None. IMPRESSION: Negative head CT. No intracranial mass, hemorrhage or edema. Electronically Signed   By: Bary Richard M.D.   On: 07/21/2018 21:08   Mr Brain Wo Contrast  Result Date: 07/22/2018 CLINICAL DATA:  Altered mental status EXAM: MRI HEAD WITHOUT CONTRAST TECHNIQUE: Multiplanar, multiecho pulse sequences of the brain and surrounding structures were obtained  without intravenous contrast. COMPARISON:  CT head 07/21/2018. FINDINGS: Brain: Incomplete study. The patient was not able to complete the study. Axial T2 images significantly degraded by motion. Negative for acute infarct. Negative for hemorrhage mass or edema. No midline shift. Small hyperintensity right frontal white matter likely due to chronic ischemia. Brainstem normal. Vascular: Normal arterial flow voids Skull and upper cervical spine: Negative Sinuses/Orbits: Negative Other: None IMPRESSION: Incomplete study.  No acute or significant abnormality is detected. Note is made of mild Chiari malformation on prior MRI from 10/23/2014. Sagittal images not performed today. Electronically Signed   By: Marlan Palau M.D.   On: 07/22/2018 09:32  Dg Chest Port 1 View  Result Date: 07/21/2018 CLINICAL DATA:  Altered mental status. EXAM: PORTABLE CHEST 1 VIEW COMPARISON:  Radiographs of Aug 25, 2009. FINDINGS: The heart size and mediastinal contours are within normal limits. Both lungs are clear. The visualized skeletal structures are unremarkable. IMPRESSION: No active disease. Electronically Signed   By: Lupita Raider, M.D.   On: 07/21/2018 23:06    Assessment: With PMH seizures ( 10/2014), COPD, anxiety, depression, chiari malformation who presented to [facility] ed on 07/21/2018 for seizure. Neurology consulted on 07/22/2018 for AMS. rEEG pending. Given that patient has not been compliant with medication she had a seizure. There was no neck stiffness or pain.  Impression:   Recommendations: -- continue keppra 500 mg BID --rEEG --f/u outpatient neurology -smoking cessation  -No driving for 6 months  Patient verbalized an understanding of the following: Per [facility] statutes, patients with seizures are not allowed to drive until  they have been seizure-free for six months. Use caution when using heavy equipment or power tools. Avoid working on ladders or at heights. Take showers instead of baths.  Ensure the water temperature is not too high on the home water heater. Do not go swimming alone. When caring for infants or small children, sit down when holding, feeding, or changing them to minimize risk of injury to the child in the event you have a seizure. Patient's mental status was clear during exam without nuchal rigidity I do not think an LP is needed at this time.   Also, Maintain good sleep hygiene. Avoid alcohol.   Valentina Lucks, MSN, NP-C [facility] [phone]  Attending neurologist's note to follow   07/22/2018, 1:04 PM  NEUROHOSPITALIST ADDENDUM Performed a face to face diagnostic evaluation.   I have reviewed the contents of history and physical exam as documented by PA/ARNP/Resident and agree with above documentation.  I have discussed and formulated the above plan as documented. Edits to the note have been made as needed.  Seizures likely secondary to noncompliance.  Low suspicion for meningitis with absent neck stiffness.  MRI brain reviewed-mild Arnold-Chiari malformation. Continue Keppra.  Seizure precautions and no driving for 6 months.  Neurology will be available as needed.    Georgiana Spinner Clairissa Valvano MD [facility] [phone]   If 7pm to 7am, please call on call as listed on AMION.

## 2018-07-22 NOTE — ED Notes (Signed)
CRITICAL VALUE ALERT  Critical Value:  Trop 0.51  Date & Time Notied:  07/22/2018 0058  Provider Notified: Dr. Selena Batten  Orders Received/Actions taken: see chart

## 2018-07-22 NOTE — Progress Notes (Signed)
Pt at MRI, not available for EEG at this time. Will reattempt EEG as schedule permits

## 2018-07-22 NOTE — Progress Notes (Signed)
Pharmacy Antibiotic Note  [name] is a 53 y.o. female admitted on 07/21/2018 with seizures. Planning to start broad spectrum antibiotics for encephalitis/meningitis. No culture data as of yet. SCr 0.8.  Vancomycin 1250 mg IV Q 24 hrs. Goal AUC 400-550. Expected AUC: 511.9 SCr used: 0.85   Plan: -Meropenem 2g/8h -Vancomycin 1250 mg IV q24h -Acyclovir 10 mg TID   Weight: 125 lb (56.7 kg)  Temp (24hrs), Avg:100 F (37.8 C), Min:99.1 F (37.3 C), Max:100.8 F (38.2 C)  Recent Labs  Lab 07/21/18 1717 07/22/18 0452  WBC 16.0* 11.2*  CREATININE 0.91 0.85      Baldemar Friday 07/22/2018 9:22 AM

## 2018-07-23 ENCOUNTER — Inpatient Hospital Stay (HOSPITAL_COMMUNITY): Payer: Self-pay

## 2018-07-23 LAB — BASIC METABOLIC PANEL
Anion gap: 9 (ref 5–15)
BUN: 10 mg/dL (ref 6–20)
CO2: 23 mmol/L (ref 22–32)
Calcium: 8.6 mg/dL — ABNORMAL LOW (ref 8.9–10.3)
Chloride: 106 mmol/L (ref 98–111)
Creatinine, Ser: 0.83 mg/dL (ref 0.44–1.00)
GFR calc Af Amer: >60 mL/min (ref >60)
GFR calc non Af Amer: >60 mL/min (ref >60)
Glucose, Bld: 101 mg/dL — ABNORMAL HIGH (ref 70–99)
Potassium: 3.5 mmol/L (ref 3.5–5.1)
Sodium: 138 mmol/L (ref 135–145)

## 2018-07-23 LAB — CALCIUM, IONIZED: Calcium, Ionized, Serum: 4.6 mg/dL (ref 4.5–5.6)

## 2018-07-23 LAB — CBC
HCT: 29.1 % — ABNORMAL LOW (ref 36.0–46.0)
Hemoglobin: 9.9 g/dL — ABNORMAL LOW (ref 12.0–15.0)
MCH: 32.8 pg (ref 26.0–34.0)
MCHC: 34 g/dL (ref 30.0–36.0)
MCV: 96.4 fL (ref 80.0–100.0)
Platelets: 168 10*3/uL (ref 150–400)
RBC: 3.02 MIL/uL — ABNORMAL LOW (ref 3.87–5.11)
RDW: 11.9 % (ref 11.5–15.5)
WBC: 11.2 10*3/uL — ABNORMAL HIGH (ref 4.0–10.5)
nRBC: 0 % (ref 0.0–0.2)

## 2018-07-23 MED ORDER — CYANOCOBALAMIN 500 MCG PO TABS: 500.0000 ug | ORAL_TABLET | Freq: Every day | ORAL | 0 refills | Status: DC

## 2018-07-23 MED ORDER — LEVETIRACETAM 750 MG PO TABS: 750.0000 mg | ORAL_TABLET | Freq: Two times a day (BID) | ORAL | 0 refills | Status: DC

## 2018-07-23 MED ORDER — LEVETIRACETAM 750 MG PO TABS: 750.0000 mg | ORAL_TABLET | Freq: Two times a day (BID) | ORAL | Status: DC

## 2018-07-23 MED ORDER — ACETAMINOPHEN 325 MG PO TABS: 650.0000 mg | ORAL_TABLET | Freq: Four times a day (QID) | ORAL | Status: DC | PRN

## 2018-07-23 MED ORDER — VITAMIN B-12 100 MCG PO TABS
500.0000 ug | ORAL_TABLET | Freq: Every day | ORAL | Status: DC
  Administered 2018-07-23: 12:00:00 500 ug via ORAL
  Filled 2018-07-23: qty 5

## 2018-07-23 NOTE — Progress Notes (Addendum)
NEUROLOGY PROGRESS NOTE  Subjective: Patient is alert and oriented, no further seizures.  Exam: Vitals:   07/23/18 0343 07/23/18 0745  BP: 113/67 101/87  Pulse: (!) 59 63  Resp: 16 18  Temp: 99.7 F (37.6 C) 100.2 F (37.9 C)  SpO2: 99% 97%    Physical Exam   HEENT-  Normocephalic, no lesions, without obvious abnormality.  Normal external eye and conjunctiva.   Extremities- Warm, dry and intact Musculoskeletal-no joint tenderness, deformity or swelling Skin-warm and dry, no hyperpigmentation, vitiligo, or suspicious lesions    Neuro:  Mental Status: Alert, oriented, thought content appropriate.  Speech fluent without evidence of aphasia.  Able to follow 3 step commands without difficulty. Cranial Nerves: II:  Visual fields grossly normal,  III,IV, VI: ptosis not present, extra-ocular motions intact bilaterally pupils equal, round, reactive to light and accommodation V,VII: smile symmetric, facial light touch sensation normal bilaterally VIII: hearing normal bilaterally IX,X: Palate rises midline XI: bilateral shoulder shrug XII: midline tongue extension Motor: Right : Upper extremity   5/5    Left:     Upper extremity   5/5  Lower extremity   5/5     Lower extremity   5/5 Tone and bulk:normal tone throughout; no atrophy noted Sensory: Pinprick and light touch intact throughout, bilaterally Deep Tendon Reflexes: 2+ and symmetric throughout Plantars: Right: downgoing   Left: downgoing Cerebellar: normal finger-to-nose,    Medications: Scheduled: Acyclovir Keppra 500 mg twice daily Merrem 2 g every 8 hours Vancomycin 1250 mg every 24 hours Ativan as needed  Pertinent Labs/Diagnostics: Awaiting EEG reading WBC 11.2  Ct Head Wo Contrast  Result Date: 07/21/2018 CLINICAL DATA:  Seizure, new, abnormal neuro exam, nontraumatic.Marland Kitchen. IMPRESSION: Negative head CT. No intracranial mass, hemorrhage or edema. Electronically Signed   By: Bary RichardStan  Maynard M.D.   On: 07/21/2018  21:08   Mr Brain Wo Contrast  Result Date: 07/22/2018 CLINICAL DATA: IMPRESSION: Incomplete study.  No acute or significant abnormality is detected. Note is made of mild Chiari malformation on prior MRI from 10/23/2014. Sagittal images not performed today. Electronically Signed   By: Marlan Palauharles  Clark M.D.   On: 07/22/2018 09:32   Dg Chest Port 1 View  Result Date: 07/21/2018 CLINICAL DATA:  Altered mental status. EXAM: PORTABLE CHEST 1 VIEW COMPARISON:  Radiographs of Aug 25, 2009. FINDINGS: The heart size and mediastinal contours are within normal limits. Both lungs are clear. The visualized skeletal structures are unremarkable. IMPRESSION: No active disease. Electronically Signed   By: Lupita RaiderJames  Green Jr, M.D.   On: 07/21/2018 23:06     Felicie MornDavid Smith PA-C [facility] [phone]   Assessment: 53 year old female with past medical history of seizure.  EEG pending however patient admits that she was noncompliant with her medications which is most likely the cause of her seizure.    Recommendations: Continue Keppra 500 mg twice daily Will look out for reading of EEG Follow-up with outpatient neurology  Per [facility] [place] [facility] [place], patients with seizures are not allowed to drive until  they have been seizure-free for six months. Use caution when using heavy equipment or power tools. Avoid working on ladders or at heights. Take showers instead of baths. Ensure the water temperature is not too high on the home water heater. Do not go swimming alone. When caring for infants or small children, sit down when holding, feeding, or changing them to minimize risk of injury to the child in the event you have a seizure.   Also, Maintain  good sleep hygiene. Avoid alcohol.     07/23/2018, 8:54 AM

## 2018-07-23 NOTE — TOC Transition Note (Signed)
Transition of Care [facility]) - CM/SW Discharge Note   Patient Details  Name: [name] MRN: [id number] Date of Birth: 11-19-1965  Transition of Care [facility]) CM/SW Contact:  Kermit Balo, RN Phone Number: 07/23/2018, 1:05 PM   Clinical Narrative:    CM spoke to her about her medications and she feels she will be able to afford the cost. She uses [place] and has not had issues affording her meds in the past.  She realizes she wasn't taking her meds correctly and wants to do better.  Pt states she has transportation home.   Final next level of care: Home/Self Care Barriers to Discharge: Inadequate or no insurance   Patient Goals and CMS Choice Patient states their goals for this hospitalization and ongoing recovery are:: to take medications like im suppose to       Discharge Placement                       Discharge Plan and Services   Discharge Planning Services: CM Consult                      Social Determinants of Health (SDOH) Interventions     Readmission Risk Interventions No flowsheet data found.

## 2018-07-23 NOTE — Progress Notes (Signed)
Patient dressed, all lines removed, and belongings packed. AVS reviewed with patient and patient given a copy to take with her. Patient taken via wheelchair by CNA to ex-husband's car for discharge.

## 2018-07-23 NOTE — TOC Initial Note (Signed)
Transition of Care [facility]) - Initial/Assessment Note    Patient Details  Name: LEOSHA VAQUERANO MRN: [id number] Date of Birth: 08/08/1965  Transition of Care [facility]) CM/SW Contact:    Kermit Balo, RN Phone Number: 07/23/2018, 1:05 PM  Clinical Narrative:                   Expected Discharge Plan: Home/Self Care Barriers to Discharge: Inadequate or no insurance   Patient Goals and CMS Choice Patient states their goals for this hospitalization and ongoing recovery are:: to take medications like im suppose to       Expected Discharge Plan and Services Expected Discharge Plan: Home/Self Care   Discharge Planning Services: CM Consult     Expected Discharge Date: 07/23/18                        Prior Living Arrangements/Services   Lives with:: Spouse Patient language and need for interpreter reviewed:: Yes(no needs) Do you feel safe going back to the place where you live?: Yes      Need for Family Participation in Patient Care: No (Comment) Care giver support system in place?: No (comment)   Criminal Activity/Legal Involvement Pertinent to Current Situation/Hospitalization: No - Comment as needed  Activities of Daily Living      Permission Sought/Granted                  Emotional Assessment Appearance:: Appears stated age Attitude/Demeanor/Rapport: Engaged Affect (typically observed): Accepting, Appropriate, Pleasant Orientation: : Oriented to Self, Oriented to Place, Oriented to  Time, Oriented to Situation   Psych Involvement: No (comment)  Admission diagnosis:  Seizure (HCC) [R56.9] Altered mental status, unspecified altered mental status type [R41.82] AMS (altered mental status) [R41.82] Patient Active Problem List   Diagnosis Date Noted  . AMS (altered mental status) 07/21/2018  . Marijuana abuse 07/21/2018  . Hypokalemia 07/21/2018  . Hypocalcemia 07/21/2018  . Aortic atherosclerosis (HCC) 05/30/2017  . Irritable bowel syndrome with both  constipation and diarrhea 04/07/2017  . Raynaud phenomenon 12/15/2015  . Mild single current episode of major depressive disorder (HCC) 06/18/2015  . Generalized anxiety disorder with panic attacks 06/18/2015  . Insomnia 05/12/2015  . Tobacco abuse 05/12/2015  . Herpes zoster 11/14/2014  . Chiari malformation type I (HCC) 11/06/2014  . Seizure (HCC) 10/23/2014  . Seasonal affective disorder (HCC) 01/08/2014  . Fecal incontinence 06/26/2013  . COPD (chronic obstructive pulmonary disease) (HCC) 12/23/2012  . Dizziness 10/22/2012   PCP:  Babs Sciara, MD Pharmacy:   [facility] [place], [place] - [address] [place] [address] Phone: [phone] Fax: [phone]     Social Determinants of Health (SDOH) Interventions    Readmission Risk Interventions No flowsheet data found.

## 2018-07-23 NOTE — Discharge Summary (Addendum)
Physician Discharge Summary  [name] H Yellen ZOX:[id number] DOB: [birth year] DOA: 07/21/2018  PCP: Babs SciaraLuking, Scott A, MD  Admit date: 07/21/2018 Discharge date: 07/23/2018  Admitted From: home Discharge disposition: home   Recommendations for Outpatient Follow-Up:   1. B12 level 2.  outpatient TEE to clarify severity of mitral insufficiency. 3.  Repeat chest x ray outpatient with nipple markers     Discharge Diagnosis:   Principal Problem:   AMS (altered mental status) Active Problems:   Seizure (HCC)   Tobacco abuse   Marijuana abuse   Hypokalemia   Hypocalcemia    Discharge Condition: Improved.  Diet recommendation:   Regular.  Wound care: None.  Code status: Full.   History of Present Illness:   [name]  is a 53 y.o. female,w  Seizure do, Copd, IBS, Anxiety/ Depression who apparently presented with seizure while at sisters house. Pt had seizure for about 5 minutes per sister. Glazed over eyes, and seemed confused.  R arm was shaking a little.  No tongue bite, no jerking motions with her legs, no incontinence.  Her sister called EMS and was brought to ED.    Hospital Course by Problem:   Post ictal s/p seizure MRI brain w/o CVA EEG- no need to repeat per neuro as h/o seizure d/o and patient is back to her baseline keppra 1000mg  iv x1-- increase dose to 750 mg BID-- encouraged compliance  Fever -source not clear -urine and chest x ray negative for infection -no URI symptoms, no pain, denies tick bites, denies muscle aches- no localizing information by patient--- she looks well so doubt meningitis -patient would prefer to monitor temperature at home- ? Related to seizure  Hypokalemia Repleted  Low B12 -replete  -follow up levels as an outpatient  Marijuana abuse -UDS + -encourage cessation  Elevated troponin -demand ischemia suspected -echo shows: Severe anterior mitral leaflet myxomatous prolapse, with at least moderate mitral  insufficiency and with preserved left heart chamber sizes and function. The mitral insufficiency was highly eccentric and Doppler assessment was insufficient. The MR is probably not severe. Consider inpatient TEE only if this is will have direct impact on current care. Otherwise, consider future outpatient TEE to clarify severity of mitral insufficiency.     Medical Consultants:   neurology   Discharge Exam:   Vitals:   07/23/18 0745 07/23/18 1157  BP: 101/87 116/65  Pulse: 63 (!) 55  Resp: 18 15  Temp: 100.2 F (37.9 C) 99.5 F (37.5 C)  SpO2: 97%    Vitals:   07/23/18 0343 07/23/18 0410 07/23/18 1157  BP: 113/67  116/65  Pulse: (!) 59  (!) 55  Resp: 16  15  Temp: 99.7 F (37.6 C)  99.5 F (37.5 C)  TempSrc: Oral  Oral  SpO2: 99%    Weight:  55.8 kg     General exam: anxious appearing  The results of significant diagnostics from this hospitalization (including imaging, microbiology, ancillary and laboratory) are listed below for reference.     Procedures and Diagnostic Studies:   Ct Head Wo Contrast  Result Date: 07/21/2018 CLINICAL DATA:  Seizure, new, abnormal neuro exam, nontraumatic. Patient combative. EXAM: CT HEAD WITHOUT CONTRAST TECHNIQUE: Contiguous axial images were obtained from the base of the skull through the vertex without intravenous contrast. COMPARISON:  Head CT dated 10/23/2014. Brain MRI dated 10/23/2014. FINDINGS: Brain: Ventricles are within normal limits in size and configuration. All areas of the brain demonstrate appropriate gray-white matter attenuation.  No mass, hemorrhage, edema or other evidence of acute parenchymal abnormality. No extra-axial hemorrhage. Vascular: No hyperdense vessel or unexpected calcification. Skull: Normal. Negative for fracture or focal lesion. Sinuses/Orbits: No acute finding. Other: None. IMPRESSION: Negative head CT. No intracranial mass, hemorrhage or edema. Electronically Signed   By: Bary Richard M.D.   On:  07/21/2018 21:08   Mr Brain Wo Contrast  Result Date: 07/22/2018 CLINICAL DATA:  Altered mental status EXAM: MRI HEAD WITHOUT CONTRAST TECHNIQUE: Multiplanar, multiecho pulse sequences of the brain and surrounding structures were obtained without intravenous contrast. COMPARISON:  CT head 07/21/2018. FINDINGS: Brain: Incomplete study. The patient was not able to complete the study. Axial T2 images significantly degraded by motion. Negative for acute infarct. Negative for hemorrhage mass or edema. No midline shift. Small hyperintensity right frontal white matter likely due to chronic ischemia. Brainstem normal. Vascular: Normal arterial flow voids Skull and upper cervical spine: Negative Sinuses/Orbits: Negative Other: None IMPRESSION: Incomplete study.  No acute or significant abnormality is detected. Note is made of mild Chiari malformation on prior MRI from 10/23/2014. Sagittal images not performed today. Electronically Signed   By: Marlan Palau M.D.   On: 07/22/2018 09:32   Dg Chest Port 1 View  Result Date: 07/21/2018 CLINICAL DATA:  Altered mental status. EXAM: PORTABLE CHEST 1 VIEW COMPARISON:  Radiographs of Aug 25, 2009. FINDINGS: The heart size and mediastinal contours are within normal limits. Both lungs are clear. The visualized skeletal structures are unremarkable. IMPRESSION: No active disease. Electronically Signed   By: Lupita Raider, M.D.   On: 07/21/2018 23:06     Labs:   Basic Metabolic Panel: Recent Labs  Lab 07/21/18 1717 07/22/18 0452 07/23/18 0557  NA 139 140 138  K 3.2* 3.8 3.5  CL 108 107 106  CO2 GLUCOSE 80 104* 101*  BUN CREATININE 0.91 0.85 0.83  CALCIUM 8.7* 9.2 8.6*  MG 2.2  --   --    GFR Estimated Creatinine Clearance: 69 mL/min (by C-G formula based on SCr of 0.83 mg/dL). Liver Function Tests: Recent Labs  Lab 07/21/18 1717 07/22/18 0452  AST 23 26  ALT 12 12  ALKPHOS 52 44  BILITOT 0.3 0.7  PROT 6.8 5.7*  ALBUMIN 4.4  3.7   No results for input(s): LIPASE, AMYLASE in the last 168 hours. No results for input(s): AMMONIA in the last 168 hours. Coagulation profile No results for input(s): INR, PROTIME in the last 168 hours.  CBC: Recent Labs  Lab 07/21/18 1717 07/22/18 0452 07/23/18 0557  WBC 16.0* 11.2* 11.2*  NEUTROABS 13.1*  --   --   HGB 11.8* 11.3* 9.9*  HCT 37.6 34.8* 29.1*  MCV 102.7* 97.2 96.4  PLT 218 196 168   Cardiac Enzymes: Recent Labs  Lab 07/21/18 2356 07/22/18 1205  TROPONINI 0.51* 0.24*   BNP: Invalid input(s): POCBNP CBG: Recent Labs  Lab 07/21/18 1701  GLUCAP 157*   D-Dimer No results for input(s): DDIMER in the last 72 hours. Hgb A1c No results for input(s): HGBA1C in the last 72 hours. Lipid Profile No results for input(s): CHOL, HDL, LDLCALC, TRIG, CHOLHDL, LDLDIRECT in the last 72 hours. Thyroid function studies Recent Labs    07/22/18 0452  TSH 1.947   Anemia work up Recent Labs    07/22/18 0452  VITAMINB12 173*   Microbiology No results found for this or any previous visit (from the past 240 hour(s)).   Discharge  Instructions:   Discharge Instructions    Call MD for:  temperature >100.4   Complete by:  As directed    Diet general   Complete by:  As directed    Discharge instructions   Complete by:  As directed    Per [facility] statutes, patients with seizures are not allowed to drive until they have been seizure-free for six months.   Use caution when using heavy equipment or power tools. Avoid working on ladders or at heights. Take showers instead of baths. Ensure the water temperature is not too high on the home water heater. Do not go swimming alone. Do not lock yourself in a room alone (i.e. bathroom). When caring for infants or small children, sit down when holding, feeding, or changing them to minimize risk of injury to the child in the event you have a seizure. Maintain good sleep hygiene. Avoid alcohol.   If patienthas  another seizure, call 911 and bring them back to the ED if: A. The seizure lasts longer than 5 minutes.  B. The patient doesn't wake shortly after the seizure or has new problems such as difficulty seeing, speaking or moving following the seizure C. The patient was injured during the seizure D. The patient has a temperature over 102 F (39C) E. The patient vomited during the seizure and now is having trouble breathing  Your B12 level was low-- will need replacement and follow up with PCP   Increase activity slowly   Complete by:  As directed      Allergies as of 07/23/2018      Reactions   Effexor [venlafaxine Hcl] Other (See Comments)   Dizziness, pain behind eye   Ampicillin Rash   Neomycin Rash   Penicillins Rash   Did it involve swelling of the face/tongue/throat, SOB, or low BP? Unknown Did it involve sudden or severe rash/hives, skin peeling, or any reaction on the inside of your mouth or nose? Unknown Did you need to seek medical attention at a hospital or doctor's office? Unknown When did it last happen? If all above answers are "NO", may proceed with cephalosporin use.      Medication List    STOP taking these medications   ARIPiprazole 2 MG tablet Commonly known as:  ABILIFY     TAKE these medications   acetaminophen 325 MG tablet Commonly known as:  TYLENOL Take 2 tablets (650 mg total) by mouth every 6 (six) hours as needed for mild pain (or Fever >/= 101).   albuterol 108 (90 Base) MCG/ACT inhaler Commonly known as:  PROVENTIL HFA;VENTOLIN HFA Inhale 2 puffs into the lungs every 6 (six) hours as needed for wheezing.   ALPRAZolam 1 MG tablet Commonly known as:  XANAX TAKE 1/2 TABLET BY MOUTH TWICE DAILY as needed , AND 1 TABLET AT BEDTIME AS NEEDED.   DULoxetine 60 MG capsule Commonly known as:  CYMBALTA Take 1 capsule (60 mg total) by mouth daily.   levETIRAcetam 750 MG tablet Commonly known as:  KEPPRA Take 1 tablet (750 mg total) by  mouth 2 (two) times daily. What changed:    medication strength  See the new instructions.   vitamin B-12 500 MCG tablet Commonly known as:  CYANOCOBALAMIN Take 1 tablet (500 mcg total) by mouth daily. Start taking on:  July 24, 2018      Follow-up Information    Luking, Jonna Coup, MD Follow up in 1 week(s).   Specialty:  Family Medicine Contact information:  [address] [place] [address] [phone]        Suanne Marker, MD Follow up in 2 month(s).   Specialties:  Neurology, Radiology Contact information: [address] [place] [address] [phone]            Time coordinating discharge: 25 min  Signed:  Joseph Art DO  [facility] 07/23/2018, 12:22 PM

## 2018-07-27 ENCOUNTER — Ambulatory Visit (INDEPENDENT_AMBULATORY_CARE_PROVIDER_SITE_OTHER): Payer: Self-pay | Admitting: Family Medicine

## 2018-07-27 ENCOUNTER — Other Ambulatory Visit: Payer: Self-pay

## 2018-07-27 DIAGNOSIS — D649 Anemia, unspecified: Secondary | ICD-10-CM

## 2018-07-27 DIAGNOSIS — F419 Anxiety disorder, unspecified: Secondary | ICD-10-CM

## 2018-07-27 DIAGNOSIS — F3289 Other specified depressive episodes: Secondary | ICD-10-CM

## 2018-07-27 DIAGNOSIS — E538 Deficiency of other specified B group vitamins: Secondary | ICD-10-CM

## 2018-07-27 DIAGNOSIS — R9389 Abnormal findings on diagnostic imaging of other specified body structures: Secondary | ICD-10-CM

## 2018-07-27 MED ORDER — CYANOCOBALAMIN 500 MCG PO TABS: ORAL_TABLET | ORAL | 0 refills | Status: DC

## 2018-07-27 MED ORDER — HYDROXYZINE PAMOATE 25 MG PO CAPS: ORAL_CAPSULE | ORAL | 4 refills | Status: DC

## 2018-07-27 MED ORDER — LEVETIRACETAM 750 MG PO TABS: 750.0000 mg | ORAL_TABLET | Freq: Two times a day (BID) | ORAL | 5 refills | Status: DC

## 2018-07-27 NOTE — Progress Notes (Signed)
Med list updated in epic and at [facility]

## 2018-07-27 NOTE — Addendum Note (Signed)
Addended by: Metro Kung on: 07/27/2018 02:25 PM   Modules accepted: Orders

## 2018-07-27 NOTE — Progress Notes (Signed)
Referral put in.

## 2018-07-27 NOTE — Addendum Note (Signed)
Addended by: Metro Kung on: 07/27/2018 02:24 PM   Modules accepted: Orders

## 2018-07-27 NOTE — Addendum Note (Signed)
Addended by: Metro Kung on: 07/27/2018 02:27 PM   Modules accepted: Orders

## 2018-07-27 NOTE — Progress Notes (Signed)
   Subjective:    Patient ID: [name], female    DOB: [birth year], 53 y.o.   MRN: [id number]  HPI Video and audio coronavirus outbreak patient present at home I was present at the office Patient calls for a follow up on recent hospitalization for seizures. Patient had seizures at home and was in the hospital for 2 days she is doing better now she states is because she stopped her seizure medicine.  She states she 1 taking it when she was supposed to be She denied abusing her medicine She has been dealing with some depression and is been difficult on her but she is trying the best she can dealing with it denies being suicidal.  Relates she is back to taking all of her medicines.  Does have family that is helping her.  Is trying to avoid coronavirus.  Virtual Visit via Video Note  I connected with [name] on 07/27/18 at  9:30 AM EDT by a video enabled telemedicine application and verified that I am speaking with the correct person using two identifiers.   I discussed the limitations of evaluation and management by telemedicine and the availability of in person appointments. The patient expressed understanding and agreed to proceed.  History of Present Illness:    Observations/Objective:   Assessment and Plan:   Follow Up Instructions:    I discussed the assessment and treatment plan with the patient. The patient was provided an opportunity to ask questions and all were answered. The patient agreed with the plan and demonstrated an understanding of the instructions.   The patient was advised to call back or seek an in-person evaluation if the symptoms worsen or if the condition fails to improve as anticipated.  I provided 15 minutes of non-face-to-face time during this encounter.      Review of Systems     Objective:   Physical Exam        Assessment & Plan:  Seizures no driving for 6 months Compliance with medicine discussed Follow-up in 3 months Depression  stable Continue medication Patient would benefit from counseling and referral Follow-up sooner if any problem Continue the Xanax Patient does have B12 deficiency she will take at thousand micrograms daily She will also do a chest x-ray because of abnormal chest x-ray needs nipple markers patient understands rationale

## 2018-09-13 ENCOUNTER — Ambulatory Visit: Payer: Self-pay | Admitting: Family Medicine

## 2018-09-17 ENCOUNTER — Telehealth: Payer: Self-pay | Admitting: Family Medicine

## 2018-09-17 NOTE — Telephone Encounter (Signed)
Pt contacted office. Pt very tearful and states she can not seem to get anxiety under control. Pt states she is not suicidal or having any thoughts of hurting self. Pt would like to see Dr.Scott, informed her that Dr.Scott is out of the office this week.pt would like to see Hoyle Sauer. Per Dr.Steve, Hoyle Sauer may see patient for this issue. Spoke with Hoyle Sauer and she states it would be fine to see pt at 4, it is pt option to come in or do a virtual visit. Left message to return call to inform patient. (informed patient that Hoyle Sauer was on the phone with a patient and that I would call her back)

## 2018-09-17 NOTE — Telephone Encounter (Signed)
ok 

## 2018-09-17 NOTE — Telephone Encounter (Signed)
Called [name] to get her an appt this afternoon and she declined and states she just wants to wait til next week for dr scott because he is more familiar with her. [name] states she is not sucidial and I advised her if anything change to call us and not wait til next week. [name] verbalized understanding.

## 2018-09-27 ENCOUNTER — Other Ambulatory Visit: Payer: Self-pay

## 2018-09-27 ENCOUNTER — Ambulatory Visit (INDEPENDENT_AMBULATORY_CARE_PROVIDER_SITE_OTHER): Payer: Self-pay | Admitting: Family Medicine

## 2018-09-27 DIAGNOSIS — F411 Generalized anxiety disorder: Secondary | ICD-10-CM

## 2018-09-27 DIAGNOSIS — F410 Panic disorder [episodic paroxysmal anxiety] without agoraphobia: Secondary | ICD-10-CM

## 2018-09-27 MED ORDER — SERTRALINE HCL 100 MG PO TABS: 150.0000 mg | ORAL_TABLET | Freq: Every day | ORAL | 1 refills | Status: DC

## 2018-09-27 NOTE — Progress Notes (Signed)
   Subjective:    Patient ID: [name], female    DOB: March 18, 1966, 53 y.o.   MRN: [id number]  HPI Video visit Patient calls and states her anxiety is getting worse and she is agitated a lot. This has been going on for a few months.Patient is having panic attacks. Patient finds her self feeling intermittently depressed finds her self feeling anxious nervous panic attacks She is doing part-time job working as a Actuary with a elderly person she feels that that is benefiting her The patient states that time she does feel aggravated and agitated but she denies being homicidal.  She denies being suicidal. Virtual Visit via Video Note  I connected with Gaylord Shih on 09/27/18 at  9:30 AM EDT by a video enabled telemedicine application and verified that I am speaking with the correct person using two identifiers.  Location: Patient: home Provider: office   I discussed the limitations of evaluation and management by telemedicine and the availability of in person appointments. The patient expressed understanding and agreed to proceed.  History of Present Illness:    Observations/Objective:   Assessment and Plan:   Follow Up Instructions:    I discussed the assessment and treatment plan with the patient. The patient was provided an opportunity to ask questions and all were answered. The patient agreed with the plan and demonstrated an understanding of the instructions.   The patient was advised to call back or seek an in-person evaluation if the symptoms worsen or if the condition fails to improve as anticipated.  I provided 15 minutes of non-face-to-face time during this encounter.      Review of Systems  Constitutional: Negative for activity change, fatigue and fever.  HENT: Negative for congestion and rhinorrhea.   Respiratory: Negative for cough, chest tightness and shortness of breath.   Cardiovascular: Negative for chest pain and leg swelling.  Gastrointestinal:  Negative for abdominal pain and nausea.  Skin: Negative for color change.  Neurological: Negative for dizziness and headaches.  Psychiatric/Behavioral: Negative for agitation and behavioral problems.       Objective:   Physical Exam  Patient had virtual visit Appears to be in no distress Atraumatic Neuro able to relate and oriented No apparent resp distress Color normal       Assessment & Plan:  Significant panic attacks with anxiety and depression I have counseled her several times of getting established with psychiatry we have given her several referrals unfortunately because of her schedule at the end of other issues she is not been able to do so  We will go ahead with sertraline in addition to this stop the Cymbalta.  I feel that that will help her overall.  Certainly if she has ongoing troubles or worsening issues she needs to let us know otherwise we will do another virtual visit with her in 2 weeks time

## 2018-10-15 ENCOUNTER — Emergency Department (HOSPITAL_COMMUNITY)
Admission: EM | Admit: 2018-10-15 | Discharge: 2018-10-16 | Disposition: A | Discharge status: Other Acute Inpatient Hospital | Payer: Self-pay | Attending: Emergency Medicine | Admitting: Emergency Medicine

## 2018-10-15 ENCOUNTER — Other Ambulatory Visit: Payer: Self-pay

## 2018-10-15 ENCOUNTER — Encounter (HOSPITAL_COMMUNITY): Payer: Self-pay | Admitting: Emergency Medicine

## 2018-10-15 DIAGNOSIS — R45851 Suicidal ideations: Secondary | ICD-10-CM

## 2018-10-15 DIAGNOSIS — Z79899 Other long term (current) drug therapy: Secondary | ICD-10-CM | POA: Insufficient documentation

## 2018-10-15 DIAGNOSIS — T450X2A Poisoning by antiallergic and antiemetic drugs, intentional self-harm, initial encounter: Secondary | ICD-10-CM | POA: Insufficient documentation

## 2018-10-15 DIAGNOSIS — R569 Unspecified convulsions: Secondary | ICD-10-CM | POA: Insufficient documentation

## 2018-10-15 DIAGNOSIS — Z03818 Encounter for observation for suspected exposure to other biological agents ruled out: Secondary | ICD-10-CM | POA: Insufficient documentation

## 2018-10-15 DIAGNOSIS — I700 Atherosclerosis of aorta: Secondary | ICD-10-CM | POA: Insufficient documentation

## 2018-10-15 DIAGNOSIS — Z880 Allergy status to penicillin: Secondary | ICD-10-CM | POA: Insufficient documentation

## 2018-10-15 DIAGNOSIS — F17210 Nicotine dependence, cigarettes, uncomplicated: Secondary | ICD-10-CM | POA: Insufficient documentation

## 2018-10-15 DIAGNOSIS — J449 Chronic obstructive pulmonary disease, unspecified: Secondary | ICD-10-CM | POA: Insufficient documentation

## 2018-10-15 DIAGNOSIS — T50902A Poisoning by unspecified drugs, medicaments and biological substances, intentional self-harm, initial encounter: Secondary | ICD-10-CM

## 2018-10-15 DIAGNOSIS — F411 Generalized anxiety disorder: Secondary | ICD-10-CM | POA: Insufficient documentation

## 2018-10-15 DIAGNOSIS — Z881 Allergy status to other antibiotic agents status: Secondary | ICD-10-CM | POA: Insufficient documentation

## 2018-10-15 DIAGNOSIS — F332 Major depressive disorder, recurrent severe without psychotic features: Secondary | ICD-10-CM | POA: Insufficient documentation

## 2018-10-15 DIAGNOSIS — Z888 Allergy status to other drugs, medicaments and biological substances status: Secondary | ICD-10-CM | POA: Insufficient documentation

## 2018-10-15 LAB — COMPREHENSIVE METABOLIC PANEL
ALT: 14 U/L (ref 0–44)
ALT: 13 U/L (ref 0–44)
AST: 17 U/L (ref 15–41)
AST: 15 U/L (ref 15–41)
Albumin: 4.4 g/dL (ref 3.5–5.0)
Albumin: 4 g/dL (ref 3.5–5.0)
Alkaline Phosphatase: 54 U/L (ref 38–126)
Alkaline Phosphatase: 52 U/L (ref 38–126)
Anion gap: 8 (ref 5–15)
Anion gap: 7 (ref 5–15)
BUN: 9 mg/dL (ref 6–20)
BUN: 9 mg/dL (ref 6–20)
CO2: 28 mmol/L (ref 22–32)
CO2: 26 mmol/L (ref 22–32)
Calcium: 9.8 mg/dL (ref 8.9–10.3)
Calcium: 9.3 mg/dL (ref 8.9–10.3)
Chloride: 105 mmol/L (ref 98–111)
Chloride: 108 mmol/L (ref 98–111)
Creatinine, Ser: 0.75 mg/dL (ref 0.44–1.00)
Creatinine, Ser: 0.76 mg/dL (ref 0.44–1.00)
GFR calc Af Amer: >60 mL/min (ref >60)
GFR calc Af Amer: >60 mL/min (ref >60)
GFR calc non Af Amer: >60 mL/min (ref >60)
GFR calc non Af Amer: >60 mL/min (ref >60)
Glucose, Bld: 92 mg/dL (ref 70–99)
Glucose, Bld: 91 mg/dL (ref 70–99)
Potassium: 4.3 mmol/L (ref 3.5–5.1)
Potassium: 4.1 mmol/L (ref 3.5–5.1)
Sodium: 141 mmol/L (ref 135–145)
Sodium: 141 mmol/L (ref 135–145)
Total Bilirubin: 0.7 mg/dL (ref 0.3–1.2)
Total Bilirubin: 0.9 mg/dL (ref 0.3–1.2)
Total Protein: 7.2 g/dL (ref 6.5–8.1)
Total Protein: 6.5 g/dL (ref 6.5–8.1)

## 2018-10-15 LAB — URINALYSIS, ROUTINE W REFLEX MICROSCOPIC
Bilirubin Urine: NEGATIVE
Glucose, UA: NEGATIVE mg/dL
Hgb urine dipstick: NEGATIVE
Ketones, ur: NEGATIVE mg/dL
Leukocytes,Ua: NEGATIVE
Nitrite: NEGATIVE
Protein, ur: NEGATIVE mg/dL
Specific Gravity, Urine: 1.002 — ABNORMAL LOW (ref 1.005–1.030)
pH: 6 (ref 5.0–8.0)

## 2018-10-15 LAB — RAPID URINE DRUG SCREEN, HOSP PERFORMED
Amphetamines: NOT DETECTED
Barbiturates: NOT DETECTED
Benzodiazepines: POSITIVE — AB
Cocaine: NOT DETECTED
Opiates: NOT DETECTED
Tetrahydrocannabinol: POSITIVE — AB

## 2018-10-15 LAB — CBC WITH DIFFERENTIAL/PLATELET
Abs Immature Granulocytes: 0.02 10*3/uL (ref 0.00–0.07)
Basophils Absolute: 0 10*3/uL (ref 0.0–0.1)
Basophils Relative: 1 %
Eosinophils Absolute: 0 10*3/uL (ref 0.0–0.5)
Eosinophils Relative: 1 %
HCT: 40.5 % (ref 36.0–46.0)
Hemoglobin: 13.1 g/dL (ref 12.0–15.0)
Immature Granulocytes: 0 %
Lymphocytes Relative: 22 %
Lymphs Abs: 1.6 10*3/uL (ref 0.7–4.0)
MCH: 32 pg (ref 26.0–34.0)
MCHC: 32.3 g/dL (ref 30.0–36.0)
MCV: 98.8 fL (ref 80.0–100.0)
Monocytes Absolute: 0.6 10*3/uL (ref 0.1–1.0)
Monocytes Relative: 8 %
Neutro Abs: 5 10*3/uL (ref 1.7–7.7)
Neutrophils Relative %: 68 %
Platelets: 218 10*3/uL (ref 150–400)
RBC: 4.1 MIL/uL (ref 3.87–5.11)
RDW: 12.1 % (ref 11.5–15.5)
WBC: 7.3 10*3/uL (ref 4.0–10.5)
nRBC: 0 % (ref 0.0–0.2)

## 2018-10-15 LAB — POC URINE PREG, ED: Preg Test, Ur: NEGATIVE

## 2018-10-15 LAB — ACETAMINOPHEN LEVEL
Acetaminophen (Tylenol), Serum: <10 ug/mL — ABNORMAL LOW (ref 10–30)
Acetaminophen (Tylenol), Serum: <10 ug/mL — ABNORMAL LOW (ref 10–30)

## 2018-10-15 LAB — PROTIME-INR
INR: 1 (ref 0.8–1.2)
Prothrombin Time: 12.9 seconds (ref 11.4–15.2)

## 2018-10-15 LAB — SALICYLATE LEVEL
Salicylate Lvl: <7 mg/dL (ref 2.8–30.0)
Salicylate Lvl: <7 mg/dL (ref 2.8–30.0)

## 2018-10-15 LAB — ETHANOL: Alcohol, Ethyl (B): <10 mg/dL (ref <10)

## 2018-10-15 LAB — CBG MONITORING, ED: Glucose-Capillary: 105 mg/dL — ABNORMAL HIGH (ref 70–99)

## 2018-10-15 MED ORDER — SODIUM CHLORIDE 0.9 % IV SOLN
INTRAVENOUS | Status: DC
  Administered 2018-10-15: 12:00:00 via INTRAVENOUS

## 2018-10-15 MED ORDER — SODIUM CHLORIDE 0.9 % IV BOLUS
1000.0000 mL | Freq: Once | INTRAVENOUS | Status: AC
  Administered 2018-10-15: 1000 mL via INTRAVENOUS

## 2018-10-15 NOTE — ED Notes (Signed)
TTS in process 

## 2018-10-15 NOTE — ED Provider Notes (Signed)
[facility] EMERGENCY DEPARTMENT Provider Note   CSN: [id number] Arrival date & time: 10/15/18  1133     History   Chief Complaint Chief Complaint  Patient presents with  . Drug Overdose    HPI Mitzi Davenportaige H Farfan is a 53 y.o. female.     The history is provided by the patient and the EMS personnel. The history is limited by the condition of the patient (psych disorder).  Drug Overdose    Pt was seen at 1215. Per EMS and pt report: Pt c/o gradually worsening depression and SI for "a while." Pt states this morning at 1030am she took #15 of Xanax 1mg  tabs and "2 vistaril" in SA. Pt states she "got my financial paperwork together," then called her "2nd ex-husband" to tell him what she did. Pt's ex-husband called EMS. Pt continues to endorse SI with SA attempt (intentional OD) this morning. Denies HI, no AVH. Denies CP/SOB, no cough, no fevers, no abd pain, no N/V/D, no focal motor weakness.     Past Medical History:  Diagnosis Date  . Anxiety   . Chiari malformation   . COPD (chronic obstructive pulmonary disease) (HCC)   . Depression   . 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 IBS (irritable bowel syndrome)   . Seizures (HCC) 10/23/14   x 2  . Shingles 11/12/14   left eye    Patient Active Problem List   Diagnosis Date Noted  . AMS (altered mental status) 07/21/2018  . Marijuana abuse 07/21/2018  . Hypokalemia 07/21/2018  . Hypocalcemia 07/21/2018  . Aortic atherosclerosis (HCC) 05/30/2017  . Irritable bowel syndrome with both constipation and diarrhea 04/07/2017  . Raynaud phenomenon 12/15/2015  . Mild single current episode of major depressive disorder (HCC) 06/18/2015  . Generalized anxiety disorder with panic attacks 06/18/2015  . Insomnia 05/12/2015  . Tobacco abuse 05/12/2015  . Herpes zoster 11/14/2014  . Chiari malformation type I (HCC) 11/06/2014  . Seizure (HCC) 10/23/2014  . Seasonal affective disorder (HCC) 01/08/2014  . Fecal incontinence 06/26/2013  .  COPD (chronic obstructive pulmonary disease) (HCC) 12/23/2012  . Dizziness 10/22/2012    Past Surgical History:  Procedure Laterality Date  . ABDOMINAL HYSTERECTOMY     partial 2010-Dr.Ferguson  . I&D EXTREMITY Left 07/17/2014   Procedure: IRRIGATION AND DEBRIDEMENT left index finger;  Surgeon: Betha LoaKevin Kuzma, MD;  [facility];  Service: Orthopedics;  Laterality: Left;  . NERVE, TENDON AND ARTERY REPAIR Left 07/17/2014   Procedure: NERVE, TENDON AND ARTERY REPAIR LEFT INDEX FINGER;  Surgeon: Betha LoaKevin Kuzma, MD;  Location: [facility] OR;  Service: Orthopedics;  Laterality: Left;  . PARTIAL HYSTERECTOMY  2006  . TUBAL LIGATION  1988     OB History   No obstetric history on file.      Home Medications    Prior to Admission medications   Medication Sig Start Date End Date Taking? Authorizing Provider  albuterol (PROVENTIL HFA;VENTOLIN HFA) 108 (90 Base) MCG/ACT inhaler Inhale 2 puffs into the lungs every 6 (six) hours as needed for wheezing. 05/11/17  Yes Luking, Jonna CoupScott A, MD  ALPRAZolam (XANAX) 1 MG tablet TAKE 1/2 TABLET BY MOUTH TWICE DAILY as needed , AND 1 TABLET AT BEDTIME AS NEEDED. 05/15/18  Yes Babs SciaraLuking, Scott A, MD  hydrOXYzine (VISTARIL) 25 MG capsule 1 nightly as needed to help with sleep 07/27/18  Yes Luking, Jonna CoupScott A, MD  levETIRAcetam (KEPPRA) 750 MG tablet Take 1 tablet (750 mg total) by mouth 2 (two) times daily.  07/27/18  Yes Babs SciaraLuking, Scott A, MD  sertraline (ZOLOFT) 100 MG tablet Take 1.5 tablets (150 mg total) by mouth daily. 09/27/18  Yes Babs SciaraLuking, Scott A, MD  vitamin B-12 (CYANOCOBALAMIN) 500 MCG tablet Take two daily 07/27/18  Yes Luking, Jonna CoupScott A, MD    Family History Family History  Problem Relation Age of Onset  . Hypertension Sister   . Depression Sister   . Drug abuse Sister   . Hypertension Mother   . Osteoporosis Mother   . Diabetes Mother   . Alcohol abuse Mother   . Depression Mother   . Heart disease Father        MI  . Depression Father   . Depression Sister   .  Multiple sclerosis Sister   . Heart attack Sister   . Cancer - Lung Maternal Aunt   . Leukemia Maternal Aunt   . Colon cancer Neg Hx     Social History Social History   Tobacco Use  . Smoking status: Current Every Day Smoker    Packs/day: 0.50    Years: 30.00    Pack years: 15.00    Types: Cigarettes  . Smokeless tobacco: Never Used  Substance Use Topics  . Alcohol use: Yes    Comment: occasionally  . Drug use: Yes    Types: Marijuana     Allergies   Cephalexin, Effexor [venlafaxine hcl], Ampicillin, Neomycin, and Penicillins   Review of Systems Review of Systems  Unable to perform ROS: Psychiatric disorder     Physical Exam Updated Vital Signs BP (!) 90/59   Pulse 65   Temp 98.1 F (36.7 C) (Oral)   Resp 17   Ht 5\' 7"  (1.702 m)   Wt 48.1 kg   SpO2 98%   BMI 16.60 kg/m    Patient Vitals for the past 24 hrs:  BP Temp Temp src Pulse Resp SpO2 Height Weight  10/15/18 1446 120/79 - - (!) 52 20 100 % - -  10/15/18 1402 (!) 90/59 - - 65 17 98 % - -  10/15/18 1330 96/68 - - - (!) 28 - - -  10/15/18 1300 111/65 - - - 13 - - -  10/15/18 1253 110/75 - - (!) 50 18 99 % - -  10/15/18 1202 107/77 - - 61 16 98 % - -  10/15/18 1138 126/83 98.1 F (36.7 C) Oral 61 18 100 % 5\' 7"  (1.702 m) 48.1 kg     Physical Exam 1220: Physical examination:  Nursing notes reviewed; Vital signs and O2 SAT reviewed;  Constitutional: Well developed, Well nourished, Well hydrated, In no acute distress; Head:  Normocephalic, atraumatic; Eyes: EOMI, PERRL, No scleral icterus; ENMT: Mouth and pharynx normal, Mucous membranes moist; Neck: Supple, Full range of motion, No lymphadenopathy; Cardiovascular: Regular rate and rhythm, No gallop; Respiratory: Breath sounds clear & equal bilaterally, No wheezes.  Speaking full sentences with ease, Normal respiratory effort/excursion; Chest: Nontender, Movement normal; Abdomen: Soft, Nontender, Nondistended, Normal bowel sounds; Genitourinary: No CVA  tenderness; Extremities: Peripheral pulses normal, No tenderness, No edema, No calf edema or asymmetry.; Neuro: AA&Ox3, Major CN grossly intact.  Speech clear. No gross focal motor or sensory deficits in extremities.; Skin: Color normal, Warm, Dry.; Psych:  Affect flat, poor eye contact. Endorses SI/SA.     ED Treatments / Results  Labs (all labs ordered are listed, but only abnormal results are displayed)   EKG EKG Interpretation  Date/Time:  Monday October 15 2018 11:43:11 EDT Ventricular Rate:  58 PR Interval:    QRS Duration: 94 QT Interval:  435 QTC Calculation: 428 R Axis:   66 Text Interpretation:  Sinus rhythm Left ventricular hypertrophy When compared with ECG of 07/21/2018, 01/21/2011 No significant change was found Confirmed by Samuel JesterMcManus, Zackory Pudlo [phone]([id number]) on 10/15/2018 12:48:57 PM   Radiology   Procedures Procedures (including critical care time)  Medications Ordered in ED Medications  0.9 %  sodium chloride infusion ( Intravenous New Bag/Given 10/15/18 1225)  sodium chloride 0.9 % bolus 1,000 mL (has no administration in time range)     Initial Impression / Assessment and Plan / ED Course  I have reviewed the triage vital signs and the nursing notes.  Pertinent labs & imaging results that were available during my care of the patient were reviewed by me and considered in my medical decision making (see chart for details).     MDM Reviewed: previous chart, nursing note and vitals Reviewed previous: labs Interpretation: labs   Results for orders placed or performed during the hospital encounter of 10/15/18  Comprehensive metabolic panel  Result Value Ref Range   Sodium 141 135 - 145 mmol/L   Potassium 4.3 3.5 - 5.1 mmol/L   Chloride 105 98 - 111 mmol/L   CO2 28 22 - 32 mmol/L   Glucose, Bld 92 70 - 99 mg/dL   BUN 9 6 - 20 mg/dL   Creatinine, Ser 6.040.75 0.44 - 1.00 mg/dL   Calcium 9.8 8.9 - 54.010.3 mg/dL   Total Protein 7.2 6.5 - 8.1 g/dL   Albumin 4.4 3.5 - 5.0  g/dL   AST 17 15 - 41 U/L   ALT 14 0 - 44 U/L   Alkaline Phosphatase 54 38 - 126 U/L   Total Bilirubin 0.7 0.3 - 1.2 mg/dL   GFR calc non Af Amer >60 >60 mL/min   GFR calc Af Amer >60 >60 mL/min   Anion gap 8 5 - 15  Salicylate level  Result Value Ref Range   Salicylate Lvl <7.0 2.8 - 30.0 mg/dL  Acetaminophen level  Result Value Ref Range   Acetaminophen (Tylenol), Serum <10 (L) 10 - 30 ug/mL  Ethanol  Result Value Ref Range   Alcohol, Ethyl (B) <10 <10 mg/dL  Urine rapid drug screen (hosp performed)  Result Value Ref Range   Opiates NONE DETECTED NONE DETECTED   Cocaine NONE DETECTED NONE DETECTED   Benzodiazepines POSITIVE (A) NONE DETECTED   Amphetamines NONE DETECTED NONE DETECTED   Tetrahydrocannabinol POSITIVE (A) NONE DETECTED   Barbiturates NONE DETECTED NONE DETECTED  CBC WITH DIFFERENTIAL  Result Value Ref Range   WBC 7.3 4.0 - 10.5 K/uL   RBC 4.10 3.87 - 5.11 MIL/uL   Hemoglobin 13.1 12.0 - 15.0 g/dL   HCT 98.140.5 19.136.0 - 47.846.0 %   MCV 98.8 80.0 - 100.0 fL   MCH 32.0 26.0 - 34.0 pg   MCHC 32.3 30.0 - 36.0 g/dL   RDW 29.512.1 62.111.5 - 30.815.5 %   Platelets 218 150 - 400 K/uL   nRBC 0.0 0.0 - 0.2 %   Neutrophils Relative % 68 %   Neutro Abs 5.0 1.7 - 7.7 K/uL   Lymphocytes Relative 22 %   Lymphs Abs 1.6 0.7 - 4.0 K/uL   Monocytes Relative 8 %   Monocytes Absolute 0.6 0.1 - 1.0 K/uL   Eosinophils Relative 1 %   Eosinophils Absolute 0.0 0.0 - 0.5 K/uL   Basophils Relative 1 %   Basophils Absolute 0.0  0.0 - 0.1 K/uL   Immature Granulocytes 0 %   Abs Immature Granulocytes 0.02 0.00 - 0.07 K/uL  Protime-INR  Result Value Ref Range   Prothrombin Time 12.9 11.4 - 15.2 seconds   INR 1.0 0.8 - 1.2  Urinalysis, Routine w reflex microscopic  Result Value Ref Range   Color, Urine STRAW (A) YELLOW   APPearance CLEAR CLEAR   Specific Gravity, Urine 1.002 (L) 1.005 - 1.030   pH 6.0 5.0 - 8.0   Glucose, UA NEGATIVE NEGATIVE mg/dL   Hgb urine dipstick NEGATIVE NEGATIVE    Bilirubin Urine NEGATIVE NEGATIVE   Ketones, ur NEGATIVE NEGATIVE mg/dL   Protein, ur NEGATIVE NEGATIVE mg/dL   Nitrite NEGATIVE NEGATIVE   Leukocytes,Ua NEGATIVE NEGATIVE  Acetaminophen level  Result Value Ref Range   Acetaminophen (Tylenol), Serum <10 (L) 10 - 30 ug/mL  Salicylate level  Result Value Ref Range   Salicylate Lvl <0.0 2.8 - 30.0 mg/dL  CBG monitoring, ED  Result Value Ref Range   Glucose-Capillary 105 (H) 70 - 99 mg/dL  POC urine preg, ED  Result Value Ref Range   Preg Test, Ur NEGATIVE NEGATIVE    1220:  Poison Control contacted: recommends repeat APAP and CMP 4 hours post ingestion, monitor for CNS and resp depression as well as seizures; observe for 6 hours post ingestion.  1430:  2nd APAP/ASA and CMP to be drawn.   1505:  Pt medically cleared after 1700. Sign out to Dr. Lita Mains.      Final Clinical Impressions(s) / ED Diagnoses   Final diagnoses:  None    ED Discharge Orders    None       Francine Graven, DO 10/15/18 1505

## 2018-10-15 NOTE — ED Notes (Signed)
Pt on phone with her son at this time.

## 2018-10-15 NOTE — ED Notes (Signed)
Pt wanded by security prior to changing into psych scrubs.  °

## 2018-10-15 NOTE — ED Notes (Signed)
Pt given meal tray.

## 2018-10-15 NOTE — ED Notes (Signed)
Poison Control called, update given. No new orders.

## 2018-10-15 NOTE — BH Assessment (Signed)
[place] Assessment Progress Note  Case was staffed with Bobby Rumpf NP who recommended patient be admitted inpatient to assist with stabilization.

## 2018-10-15 NOTE — ED Notes (Signed)
Telesitter called and informed now monitoring pt.

## 2018-10-15 NOTE — BH Assessment (Addendum)
Assessment Note  [name] is an 53 y.o. female that presents this date voluntary after ingesting multiple medications earlier this date in an attempt to self harm. Patient denies any H/I or AVH. Patient denies any previous attempts or gestures at self harm. Patient denies any current SA issues. Patient states she is divorced and resides alone. Patient reports she was diagnosed with depression "years ago" and is [facility] where she sees Lucking MD who prescribes current medications to assist with symptom management. Patient reports she takes those medications as indicated although has recently lost her employment which worsened her depression stating "she felt totally hopeless." Patient states the incident that occurred earlier was a attempt to take her life. Patient reports her depression has worsened over the last two weeks with symptoms to include: excessive fatigue, isolating and feeling worthless. Patient states she lacks social support and has been struggling financially since she lost her employment. Per notes patient was noted to have ingested this morning at 1030 a.m 15  Xanax 1mg   and "2 vistaril." Patient states she "got her financial paperwork together," then called her "2nd ex-husband" to tell him what she did. Patient's ex-husband called EMS. Patient continues to endorse SI with SA attempt (intentional OD) this morning. Denies HI, no AVH. Patient denies any history of abuse or current legal issues. Patient denies any previous hospitalizations associated with mental health. Patient does not appear to be responding to any internal stimuli. Patient is slow to respond to this writer's questions and presents with a flat affect. Patient continues to be drowsy at the time of assessment but seems to process the content of this writer's questions. Patient is dressed in scrubs with poor eye contact. Patient's  mood was sad and depressed. Patient's  judgement was impaired. Patient was oriented x 4 with decreased concentration. Case was staffed with Melvyn NethLewis NP who recommended patient be admitted inpatient to assist with stabilization.   Diagnosis: F33.2 MDD recurrent without psychotic features, severe  Past Medical History:  Past Medical History:  Diagnosis Date  . Anxiety   . Chiari malformation   . COPD (chronic obstructive pulmonary disease) (HCC)   . Depression   . 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 IBS (irritable bowel syndrome)   . Seizures (HCC) 10/23/14   x 2  . Shingles 11/12/14   left eye    Past Surgical History:  Procedure Laterality Date  . ABDOMINAL HYSTERECTOMY     partial 2010-Dr.Ferguson  . I&D EXTREMITY Left 07/17/2014   Procedure: IRRIGATION AND DEBRIDEMENT left index finger;  Surgeon: Betha LoaKevin Kuzma, MD;  [facility];  Service: Orthopedics;  Laterality: Left;  . NERVE, TENDON AND ARTERY REPAIR Left 07/17/2014   Procedure: NERVE, TENDON AND ARTERY REPAIR LEFT INDEX FINGER;  Surgeon: Betha LoaKevin Kuzma, MD;  Location: [facility] OR;  Service: Orthopedics;  Laterality: Left;  . PARTIAL HYSTERECTOMY  2006  . TUBAL LIGATION  1988    Family History:  Family History  Problem Relation Age of Onset  . Hypertension Sister   . Depression Sister   . Drug abuse Sister   . Hypertension Mother   . Osteoporosis Mother   . Diabetes Mother   . Alcohol abuse Mother   . Depression Mother   . Heart disease Father        MI  . Depression Father   . Depression Sister   . Multiple sclerosis Sister   . Heart attack Sister   .  Cancer - Lung Maternal Aunt   . Leukemia Maternal Aunt   . Colon cancer Neg Hx     Social History:  reports that she has been smoking cigarettes. She has a 15.00 pack-year smoking history. She has never used smokeless tobacco. She reports current alcohol use. She reports current drug use. Drug: Marijuana.  Additional Social History:  Alcohol / Drug Use Pain Medications: See MAR Prescriptions: See MAR Over  the Counter: See MAR History of alcohol / drug use?: No history of alcohol / drug abuse  CIWA: CIWA-Ar BP: 124/81 Pulse Rate: 60 COWS:    Allergies:  Allergies  Allergen Reactions  . Cephalexin   . Effexor [Venlafaxine Hcl] Other (See Comments)    Dizziness, pain behind eye  . Ampicillin Rash  . Neomycin Rash  . Penicillins Rash    Did it involve swelling of the face/tongue/throat, SOB, or low BP? Unknown Did it involve sudden or severe rash/hives, skin peeling, or any reaction on the inside of your mouth or nose? Unknown Did you need to seek medical attention at a hospital or doctor's office? Unknown When did it last happen? If all above answers are "NO", may proceed with cephalosporin use.     Home Medications: (Not in a hospital admission)   OB/GYN Status:  No LMP recorded. Patient has had a hysterectomy.  General Assessment Data Location of Assessment: [facility] ED TTS Assessment: In system Is this a Tele or Face-to-Face Assessment?: Tele Assessment Is this an Initial Assessment or a Re-assessment for this encounter?: Initial Assessment Patient Accompanied by:: N/A Language Other than English: No Living Arrangements: Other (Comment) What gender do you identify as?: Female Marital status: Divorced Pregnancy Status: No Living Arrangements: Alone Can pt return to current living arrangement?: Yes Admission Status: Voluntary Is patient capable of signing voluntary admission?: Yes Referral Source: Self/Family/Friend Insurance type: Self pay  Medical Screening Exam [facility]-in ONLY) Medical Exam completed: Yes  Crisis Care Plan Living Arrangements: Alone Legal Guardian: (NA) Name of Psychiatrist: None Name of Therapist: None  Education Status Is patient currently in school?: No Is the patient employed, unemployed or receiving disability?: Unemployed  Risk to self with the past 6 months Suicidal Ideation: Yes-Currently Present Has patient been a risk to self  within the past 6 months prior to admission? : No Suicidal Intent: Yes-Currently Present Has patient had any suicidal intent within the past 6 months prior to admission? : No Is patient at risk for suicide?: Yes Suicidal Plan?: Yes-Currently Present Has patient had any suicidal plan within the past 6 months prior to admission? : No Specify Current Suicidal Plan: Overdose Access to Means: Yes Specify Access to Suicidal Means: Pt had medications  What has been your use of drugs/alcohol within the last 12 months?: Denies Previous Attempts/Gestures: No How many times?: 0 Other Self Harm Risks: (NA) Triggers for Past Attempts: (NA) Intentional Self Injurious Behavior: None Family Suicide History: No Recent stressful life event(s): Job Loss Persecutory voices/beliefs?: No Depression: Yes Depression Symptoms: Despondent, Fatigue Substance abuse history and/or treatment for substance abuse?: No Suicide prevention information given to non-admitted patients: Not applicable  Risk to Others within the past 6 months Homicidal Ideation: No Does patient have any lifetime risk of violence toward others beyond the six months prior to admission? : No Thoughts of Harm to Others: No Current Homicidal Intent: No Current Homicidal Plan: No Access to Homicidal Means: No Identified Victim: NA History of harm to others?: No Assessment of Violence: None Noted  Violent Behavior Description: NA Does patient have access to weapons?: No Criminal Charges Pending?: No Does patient have a court date: No Is patient on probation?: No  Psychosis Hallucinations: None noted Delusions: None noted  Mental Status Report Appearance/Hygiene: Unremarkable Eye Contact: Fair Motor Activity: Freedom of movement Speech: Logical/coherent Level of Consciousness: Quiet/awake Mood: Depressed Affect: Sad Anxiety Level: Minimal Thought Processes: Coherent, Relevant Judgement: Partial Orientation: Person, Place,  Time Obsessive Compulsive Thoughts/Behaviors: None  Cognitive Functioning Concentration: Normal Memory: Recent Intact, Remote Intact Is patient IDD: No Insight: Fair Impulse Control: Poor Appetite: Fair Have you had any weight changes? : No Change Sleep: No Change Total Hours of Sleep: 6 Vegetative Symptoms: None  ADLScreening [facility] Assessment Services) Patient's cognitive ability adequate to safely complete daily activities?: Yes Patient able to express need for assistance with ADLs?: Yes Independently performs ADLs?: Yes (appropriate for developmental age)  Prior Inpatient Therapy Prior Inpatient Therapy: No  Prior Outpatient Therapy Prior Outpatient Therapy: Yes Prior Therapy Dates: Ongoing Prior Therapy Facilty/Provider(s): [facility] Reason for Treatment: Med mang Does patient have an ACCT team?: No Does patient have Intensive In-House Services?  : No Does patient have [facility] services? : No Does patient have P4CC services?: No  ADL Screening (condition at time of admission) Patient's cognitive ability adequate to safely complete daily activities?: Yes Is the patient deaf or have difficulty hearing?: No Does the patient have difficulty seeing, even when wearing glasses/contacts?: No Does the patient have difficulty concentrating, remembering, or making decisions?: No Patient able to express need for assistance with ADLs?: Yes Does the patient have difficulty dressing or bathing?: No Independently performs ADLs?: Yes (appropriate for developmental age) Does the patient have difficulty walking or climbing stairs?: No Weakness of Legs: None Weakness of Arms/Hands: None  Home Assistive Devices/Equipment Home Assistive Devices/Equipment: None  Therapy Consults (therapy consults require a physician order) PT Evaluation Needed: No OT Evalulation Needed: No SLP Evaluation Needed: No Abuse/Neglect Assessment (Assessment to be complete while patient is  alone) Physical Abuse: Denies Verbal Abuse: Denies Sexual Abuse: Denies Exploitation of patient/patient's resources: Denies Self-Neglect: Denies Values / Beliefs Cultural Requests During Hospitalization: None Spiritual Requests During Hospitalization: None Consults Spiritual Care Consult Needed: No Social Work Consult Needed: No Regulatory affairs officer (For Healthcare) Does Patient Have a Medical Advance Directive?: No Would patient like information on creating a medical advance directive?: No - Patient declined, No - Guardian declined          Disposition: Case was staffed with Bobby Rumpf NP who recommended patient be admitted inpatient to assist with stabilization.   Disposition Initial Assessment Completed for this Encounter: Yes Disposition of Patient: Admit Type of inpatient treatment program: Adult Patient refused recommended treatment: No Mode of transportation if patient is discharged/movement?: (Unk)  On Site Evaluation by:   Reviewed with Physician:    Mamie Nick 10/15/2018 5:59 PM

## 2018-10-15 NOTE — ED Notes (Signed)
Poison control recommends 4 hr post acetaminophen level along with repeat CMP. Monitor for CNS and resp depression and intubate if necessary. Watch for seizures and tx with Benzos if needed. Observe for 6 hours post ingestion.

## 2018-10-15 NOTE — ED Notes (Signed)
Pt rewanded by security following changing into psych scrubs.

## 2018-10-15 NOTE — ED Provider Notes (Signed)
Patient is medically cleared for psychiatric evaluation.  Remains voluntary at this time.   Julianne Rice, MD 10/15/18 [id number]

## 2018-10-15 NOTE — ED Notes (Signed)
Poison control called and given update on pt and last set of vitals.

## 2018-10-15 NOTE — ED Triage Notes (Signed)
Pt reports she took Xanax 15mg  and "a few vistaril" trying to kill herself. Called her exhusband and told him she took a bunch of pills and he called 911.

## 2018-10-16 ENCOUNTER — Inpatient Hospital Stay (HOSPITAL_COMMUNITY)
Admission: AD | Admit: 2018-10-16 | Discharge: 2018-10-20 | DRG: 885 | Disposition: A | Discharge status: Home / Self Care | Payer: Federal, State, Local not specified - Other | Attending: Psychiatry | Admitting: Psychiatry

## 2018-10-16 ENCOUNTER — Other Ambulatory Visit: Payer: Self-pay

## 2018-10-16 ENCOUNTER — Encounter (HOSPITAL_COMMUNITY): Payer: Self-pay

## 2018-10-16 DIAGNOSIS — J449 Chronic obstructive pulmonary disease, unspecified: Secondary | ICD-10-CM | POA: Diagnosis present

## 2018-10-16 DIAGNOSIS — Z818 Family history of other mental and behavioral disorders: Secondary | ICD-10-CM | POA: Diagnosis not present

## 2018-10-16 DIAGNOSIS — F17210 Nicotine dependence, cigarettes, uncomplicated: Secondary | ICD-10-CM | POA: Diagnosis present

## 2018-10-16 DIAGNOSIS — Z915 Personal history of self-harm: Secondary | ICD-10-CM

## 2018-10-16 DIAGNOSIS — Z87820 Personal history of traumatic brain injury: Secondary | ICD-10-CM | POA: Diagnosis not present

## 2018-10-16 DIAGNOSIS — Z881 Allergy status to other antibiotic agents status: Secondary | ICD-10-CM | POA: Diagnosis not present

## 2018-10-16 DIAGNOSIS — Z90711 Acquired absence of uterus with remaining cervical stump: Secondary | ICD-10-CM

## 2018-10-16 DIAGNOSIS — F332 Major depressive disorder, recurrent severe without psychotic features: Principal | ICD-10-CM | POA: Diagnosis present

## 2018-10-16 DIAGNOSIS — G40909 Epilepsy, unspecified, not intractable, without status epilepticus: Secondary | ICD-10-CM | POA: Diagnosis present

## 2018-10-16 DIAGNOSIS — G4700 Insomnia, unspecified: Secondary | ICD-10-CM | POA: Diagnosis present

## 2018-10-16 DIAGNOSIS — Z820 Family history of epilepsy and other diseases of the nervous system: Secondary | ICD-10-CM | POA: Diagnosis not present

## 2018-10-16 DIAGNOSIS — Z880 Allergy status to penicillin: Secondary | ICD-10-CM

## 2018-10-16 DIAGNOSIS — Z9851 Tubal ligation status: Secondary | ICD-10-CM | POA: Diagnosis not present

## 2018-10-16 DIAGNOSIS — Z806 Family history of leukemia: Secondary | ICD-10-CM | POA: Diagnosis not present

## 2018-10-16 DIAGNOSIS — Z888 Allergy status to other drugs, medicaments and biological substances status: Secondary | ICD-10-CM | POA: Diagnosis not present

## 2018-10-16 DIAGNOSIS — Z1159 Encounter for screening for other viral diseases: Secondary | ICD-10-CM

## 2018-10-16 LAB — SARS CORONAVIRUS 2 BY RT PCR (HOSPITAL ORDER, PERFORMED IN ~~LOC~~ HOSPITAL LAB): SARS Coronavirus 2: NEGATIVE

## 2018-10-16 MED ORDER — MAGNESIUM HYDROXIDE 400 MG/5ML PO SUSP: 30.0000 mL | Freq: Every day | ORAL | Status: DC | PRN

## 2018-10-16 MED ORDER — LEVETIRACETAM 750 MG PO TABS
750.0000 mg | ORAL_TABLET | Freq: Two times a day (BID) | ORAL | Status: DC
  Administered 2018-10-16 – 2018-10-20 (×7): 750 mg via ORAL
  Filled 2018-10-16: qty 1
  Filled 2018-10-16: qty 3
  Filled 2018-10-16 (×7): qty 1
  Filled 2018-10-16: qty 3
  Filled 2018-10-16 (×4): qty 1

## 2018-10-16 MED ORDER — LORAZEPAM 2 MG/ML IJ SOLN
1.0000 mg | Freq: Once | INTRAMUSCULAR | Status: AC
  Administered 2018-10-16: 10:00:00 1 mg via INTRAMUSCULAR

## 2018-10-16 MED ORDER — ALBUTEROL SULFATE HFA 108 (90 BASE) MCG/ACT IN AERS: 2.0000 | INHALATION_SPRAY | Freq: Four times a day (QID) | RESPIRATORY_TRACT | Status: DC | PRN

## 2018-10-16 MED ORDER — CHLORDIAZEPOXIDE HCL 25 MG PO CAPS
25.0000 mg | ORAL_CAPSULE | Freq: Once | ORAL | Status: AC
  Administered 2018-10-16: 25 mg via ORAL
  Filled 2018-10-16: qty 1

## 2018-10-16 MED ORDER — HYDROXYZINE HCL 25 MG PO TABS
25.0000 mg | ORAL_TABLET | Freq: Three times a day (TID) | ORAL | Status: DC | PRN
  Administered 2018-10-16: 25 mg via ORAL
  Filled 2018-10-16: qty 1

## 2018-10-16 MED ORDER — LORAZEPAM 1 MG PO TABS: 1.0000 mg | ORAL_TABLET | Freq: Once | ORAL | Status: DC

## 2018-10-16 MED ORDER — ALUM & MAG HYDROXIDE-SIMETH 200-200-20 MG/5ML PO SUSP: 30.0000 mL | ORAL | Status: DC | PRN

## 2018-10-16 MED ORDER — LORAZEPAM 2 MG/ML IJ SOLN
INTRAMUSCULAR | Status: AC
  Administered 2018-10-16: 1 mg via INTRAMUSCULAR
  Filled 2018-10-16: qty 1

## 2018-10-16 MED ORDER — ACETAMINOPHEN 325 MG PO TABS
650.0000 mg | ORAL_TABLET | Freq: Four times a day (QID) | ORAL | Status: DC | PRN
  Administered 2018-10-18: 650 mg via ORAL
  Filled 2018-10-16: qty 2

## 2018-10-16 NOTE — Progress Notes (Signed)
Pt accepted to [place], Bed 407-2 Priscille Loveless, NP is the accepting provider.  Neita Garnet, MD is the attending provider.  Call report to [phone]  [email or web address] notified.   Pt is IVC   Pt may be transported by [employer] Pt scheduled  to arrive at [facility] once IVC has been served.  Areatha Keas. Judi Cong, MSW, [place] Disposition Clinical Social Work [phone] (cell) [phone] (office)

## 2018-10-16 NOTE — Tx Team (Signed)
Initial Treatment Plan 10/16/2018 6:45 PM ABBAGAIL SCAFF [id number]    PATIENT STRESSORS: Medication change or noncompliance Substance abuse   PATIENT STRENGTHS: Ability for insight Average or above average intelligence   PATIENT IDENTIFIED PROBLEMS: "nothing"                     DISCHARGE CRITERIA:  Adequate post-discharge living arrangements Improved stabilization in mood, thinking, and/or behavior  PRELIMINARY DISCHARGE PLAN: Outpatient therapy  PATIENT/FAMILY INVOLVEMENT: This treatment plan has been presented to and reviewed with the patient, Gaylord Shih, and/or family member,  The patient and family have been given the opportunity to ask questions and make suggestions.  Megan Mans, RN 10/16/2018, 6:45 PM

## 2018-10-16 NOTE — ED Notes (Signed)
Pt states she is leaving and walked out of room.  informed that she is ivc'd and police would be contacted.  Pt yelled out "well dont call them"   Pt went back into room.

## 2018-10-16 NOTE — ED Notes (Signed)
Wasted 1mg  of Lorazepam with Drema Balzarine

## 2018-10-16 NOTE — ED Notes (Signed)
Pt is stating she wants to leave that we are not helping her. I informed pt that she was waiting for placement and it may take a while. She states she is going to walk out, EDP notified.

## 2018-10-16 NOTE — ED Notes (Signed)
IVC papers faxed to [facility]. 

## 2018-10-16 NOTE — Progress Notes (Signed)
[place] NOVEL CORONAVIRUS (COVID-19) DAILY CHECK-OFF SYMPTOMS - answer yes or no to each - every day NO YES  Have you had a fever in the past 24 hours?  . Fever (Temp > 37.80C / 100F) X   Have you had any of these symptoms in the past 24 hours? . New Cough .  Sore Throat  .  Shortness of Breath .  Difficulty Breathing .  Unexplained Body Aches   X   Have you had any one of these symptoms in the past 24 hours not related to allergies?   . Runny Nose .  Nasal Congestion .  Sneezing   X   If you have had runny nose, nasal congestion, sneezing in the past 24 hours, has it worsened?  X   EXPOSURES - check yes or no X   Have you traveled outside the state in the past 14 days?  X   Have you been in contact with someone with a confirmed diagnosis of COVID-19 or PUI in the past 14 days without wearing appropriate PPE?  X   Have you been living in the same home as a person with confirmed diagnosis of COVID-19 or a PUI (household contact)?    X   Have you been diagnosed with COVID-19?    X              What to do next: Answered NO to all: Answered YES to anything:   Proceed with unit schedule Follow the [facility] Inpatient Flowsheet.   

## 2018-10-16 NOTE — ED Notes (Signed)
IVC paperwork faxed to Magistrate and to [facility]  

## 2018-10-16 NOTE — ED Provider Notes (Signed)
Patient states that she does not want to stay and she is requesting permission to leave.  She presented with an intentional overdose of alprazolam with suicidal intent.  She is not safe for discharge.  IVC paperwork is filled out.   Delora Fuel, MD 09[birth year] [phone]

## 2018-10-16 NOTE — BH Assessment (Signed)
Reassessment note:  Pt presents lying in hospital bed. Her affect initially flat until she stated she spoke with her son this morning & she cried. Pt states waiting in ED for psychiatric admission is difficult. She acknowledges she in now under IVC. Pt denies current SI, HI & AVH. She was advised that sometimes the wait can be longer than others but we are actively seeking inpt tx to help her. Pt continues to meet criteria for inpt tx.

## 2018-10-16 NOTE — Progress Notes (Signed)
The patient's positive event for the day was that she was pleased that she made it over to [facility].Her goal for tomorrow is to work on feeling better for herself.

## 2018-10-17 DIAGNOSIS — F332 Major depressive disorder, recurrent severe without psychotic features: Principal | ICD-10-CM

## 2018-10-17 LAB — LIPID PANEL
Cholesterol: 213 mg/dL — ABNORMAL HIGH (ref 0–200)
HDL: 41 mg/dL (ref >40)
LDL Cholesterol: 155 mg/dL — ABNORMAL HIGH (ref 0–99)
Total CHOL/HDL Ratio: 5.2 RATIO
Triglycerides: 86 mg/dL (ref <150)
VLDL: 17 mg/dL (ref 0–40)

## 2018-10-17 LAB — TSH: TSH: 2.255 u[IU]/mL (ref 0.350–4.500)

## 2018-10-17 MED ORDER — MIRTAZAPINE 7.5 MG PO TABS
7.5000 mg | ORAL_TABLET | Freq: Every day | ORAL | Status: DC
  Administered 2018-10-17 – 2018-10-18 (×2): 7.5 mg via ORAL
  Filled 2018-10-17 (×3): qty 1

## 2018-10-17 MED ORDER — LOPERAMIDE HCL 2 MG PO CAPS: 2.0000 mg | ORAL_CAPSULE | ORAL | Status: DC | PRN

## 2018-10-17 MED ORDER — ADULT MULTIVITAMIN W/MINERALS CH
1.0000 | ORAL_TABLET | Freq: Every day | ORAL | Status: DC
  Administered 2018-10-17 – 2018-10-20 (×4): 1 via ORAL
  Filled 2018-10-17 (×6): qty 1

## 2018-10-17 MED ORDER — VITAMIN B-1 100 MG PO TABS
100.0000 mg | ORAL_TABLET | Freq: Every day | ORAL | Status: DC
  Administered 2018-10-18 – 2018-10-20 (×3): 100 mg via ORAL
  Filled 2018-10-17 (×5): qty 1

## 2018-10-17 MED ORDER — SERTRALINE HCL 100 MG PO TABS
100.0000 mg | ORAL_TABLET | Freq: Every day | ORAL | Status: DC
  Administered 2018-10-17 – 2018-10-20 (×4): 100 mg via ORAL
  Filled 2018-10-17 (×7): qty 1

## 2018-10-17 MED ORDER — ONDANSETRON 4 MG PO TBDP: 4.0000 mg | ORAL_TABLET | Freq: Four times a day (QID) | ORAL | Status: DC | PRN

## 2018-10-17 MED ORDER — LORAZEPAM 1 MG PO TABS
1.0000 mg | ORAL_TABLET | Freq: Four times a day (QID) | ORAL | Status: DC | PRN
  Administered 2018-10-18: 1 mg via ORAL
  Filled 2018-10-17: qty 1

## 2018-10-17 MED ORDER — HYDROXYZINE HCL 25 MG PO TABS
25.0000 mg | ORAL_TABLET | Freq: Four times a day (QID) | ORAL | Status: DC | PRN
  Administered 2018-10-17 – 2018-10-18 (×2): 25 mg via ORAL
  Filled 2018-10-17 (×2): qty 1

## 2018-10-17 NOTE — Progress Notes (Signed)
Patient ID: [name], female   DOB: [birth year], 53 y.o.   MRN: [id number] D: Patient reported feeling anxious. Pt reports she was working on her workbook and felt overwhelmed with dealing with her sister and finding a job. Pt attended evening wrap up group and Interacted appropriately with peers. Denies  SI/HI/AVH and pain. A: Support and encouragement offered as needed to express needs. Helped pt organize thoughts into short and long term goals. Medications administered as prescribed.  R: Patient is safe and cooperative on unit. Will continue to monitor  for safety and stability.
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
## 2018-10-17 NOTE — Progress Notes (Signed)
Adult Psychoeducational Group Note  Date:  10/17/2018 Time:  9:05 PM  Group Topic/Focus:  Wrap-Up Group:   The focus of this group is to help patients review their daily goal of treatment and discuss progress on daily workbooks.  Participation Level:  Active  Participation Quality:  Appropriate  Affect:  Appropriate  Cognitive:  Alert  Insight: Appropriate  Engagement in Group:  Engaged  Modes of Intervention:  Discussion  Additional Comments:  Patient stated having a pretty good day. Patient stated that today she spoke with the doctor, got her meds straightened out, and studied her workbook.   Anthonymichael Munday L Aymee Fomby 10/17/2018, 9:05 PM

## 2018-10-17 NOTE — Progress Notes (Signed)
Adult Psychoeducational Group Note  Date:  10/17/2018 Time:  6:04 PM  Group Topic/Focus:  Goals Group:   The focus of this group is to help patients establish daily goals to achieve during treatment and discuss how the patient can incorporate goal setting into their daily lives to aide in recovery.  Participation Level:  Active  Participation Quality:  Appropriate  Affect:  Appropriate  Cognitive:  Alert  Insight: Appropriate  Engagement in Group:  Engaged  Modes of Intervention:  Discussion  Additional Comments:  Pt attended group and participated in discussion.  Niasia Lanphear R Nariah Morgano 10/17/2018, 6:04 PM

## 2018-10-17 NOTE — Tx Team (Signed)
Interdisciplinary Treatment and Diagnostic Plan Update  10/17/2018 Time of Session: 9:00am KILI GRACY MRN: [id number]  Principal Diagnosis: <principal problem not specified>  Secondary Diagnoses: Active Problems:   MDD (major depressive disorder), recurrent severe, without psychosis ([place])   Current Medications:  Current Facility-Administered Medications  Medication Dose Route Frequency Provider Last Rate Last Dose  . acetaminophen (TYLENOL) tablet 650 mg  650 mg Oral Q6H PRN Suella Broad, FNP      . albuterol (VENTOLIN HFA) 108 (90 Base) MCG/ACT inhaler 2 puff  2 puff Inhalation Q6H PRN Lindon Romp A, NP      . alum & mag hydroxide-simeth (MAALOX/MYLANTA) 200-200-20 MG/5ML suspension 30 mL  30 mL Oral Q4H PRN Burt Ek, Gayland Curry, FNP      . hydrOXYzine (ATARAX/VISTARIL) tablet 25 mg  25 mg Oral TID PRN Lindon Romp A, NP   25 mg at 10/16/18 2249  . levETIRAcetam (KEPPRA) tablet 750 mg  750 mg Oral BID Lindon Romp A, NP   750 mg at 10/17/18 0739  . magnesium hydroxide (MILK OF MAGNESIA) suspension 30 mL  30 mL Oral Daily PRN Suella Broad, FNP       PTA Medications: Medications Prior to Admission  Medication Sig Dispense Refill Last Dose  . albuterol (PROVENTIL HFA;VENTOLIN HFA) 108 (90 Base) MCG/ACT inhaler Inhale 2 puffs into the lungs every 6 (six) hours as needed for wheezing. 1 Inhaler 2   . ALPRAZolam (XANAX) 1 MG tablet TAKE 1/2 TABLET BY MOUTH TWICE DAILY as needed , AND 1 TABLET AT BEDTIME AS NEEDED. 60 tablet 5   . hydrOXYzine (VISTARIL) 25 MG capsule 1 nightly as needed to help with sleep 30 capsule 4   . levETIRAcetam (KEPPRA) 750 MG tablet Take 1 tablet (750 mg total) by mouth 2 (two) times daily. 60 tablet 5   . sertraline (ZOLOFT) 100 MG tablet Take 1.5 tablets (150 mg total) by mouth daily. 45 tablet 1   . vitamin B-12 (CYANOCOBALAMIN) 500 MCG tablet Take two daily 60 tablet 0     Patient Stressors: Medication change or noncompliance Substance  abuse  Patient Strengths: Ability for insight Average or above average intelligence  Treatment Modalities: Medication Management, Group therapy, Case management,  1 to 1 session with clinician, Psychoeducation, Recreational therapy.   Physician Treatment Plan for Primary Diagnosis: <principal problem not specified> Long Term Goal(s):     Short Term Goals:    Medication Management: Evaluate patient's response, side effects, and tolerance of medication regimen.  Therapeutic Interventions: 1 to 1 sessions, Unit Group sessions and Medication administration.  Evaluation of Outcomes: Not Met  Physician Treatment Plan for Secondary Diagnosis: Active Problems:   MDD (major depressive disorder), recurrent severe, without psychosis ([place])  Long Term Goal(s):     Short Term Goals:       Medication Management: Evaluate patient's response, side effects, and tolerance of medication regimen.  Therapeutic Interventions: 1 to 1 sessions, Unit Group sessions and Medication administration.  Evaluation of Outcomes: Not Met   RN Treatment Plan for Primary Diagnosis: <principal problem not specified> Long Term Goal(s): Knowledge of disease and therapeutic regimen to maintain health will improve  Short Term Goals: Ability to verbalize feelings will improve, Ability to disclose and discuss suicidal ideas, Ability to identify and develop effective coping behaviors will improve and Compliance with prescribed medications will improve  Medication Management: RN will administer medications as ordered by provider, will assess and evaluate patient's response and provide education to patient for  prescribed medication. RN will report any adverse and/or side effects to prescribing provider.  Therapeutic Interventions: 1 on 1 counseling sessions, Psychoeducation, Medication administration, Evaluate responses to treatment, Monitor vital signs and CBGs as ordered, Perform/monitor CIWA, COWS, AIMS and Fall Risk  screenings as ordered, Perform wound care treatments as ordered.  Evaluation of Outcomes: Not Met   LCSW Treatment Plan for Primary Diagnosis: <principal problem not specified> Long Term Goal(s): Safe transition to appropriate next level of care at discharge, Engage patient in therapeutic group addressing interpersonal concerns.  Short Term Goals: Engage patient in aftercare planning with referrals and resources, Increase social support, Identify triggers associated with mental health/substance abuse issues and Increase skills for wellness and recovery  Therapeutic Interventions: Assess for all discharge needs, 1 to 1 time with Social worker, Explore available resources and support systems, Assess for adequacy in community support network, Educate family and significant other(s) on suicide prevention, Complete Psychosocial Assessment, Interpersonal group therapy.  Evaluation of Outcomes: Not Met   Progress in Treatment: Attending groups: Yes. Participating in groups: Yes. Taking medication as prescribed: Yes. Toleration medication: Yes. Family/Significant other contact made: No, will contact:  supports if consents are granted. Patient understands diagnosis: Yes. Discussing patient identified problems/goals with staff: No. Medical problems stabilized or resolved: Yes. Denies suicidal/homicidal ideation: No. Issues/concerns per patient self-inventory: Yes.  New problem(s) identified: Yes, Describe:  unemployed, limited supports  New Short Term/Long Term Goal(s): detox, medication management for mood stabilization; elimination of SI thoughts; development of comprehensive mental wellness/sobriety plan.  Patient Goals:    Discharge Plan or Barriers: CSW continuing to assess for appropriate referrals.   Reason for Continuation of Hospitalization: Anxiety Depression Suicidal ideation  Estimated Length of Stay: 3-5 days  Attendees: Patient: 10/17/2018 11:05 AM  Physician:  10/17/2018  11:05 AM  Nursing:  10/17/2018 11:05 AM  RN Care Manager: 10/17/2018 11:05 AM  Social Worker: Stephanie Acre, [place] 10/17/2018 11:05 AM  Recreational Therapist:  10/17/2018 11:05 AM  Other:  10/17/2018 11:05 AM  Other:  10/17/2018 11:05 AM  Other: 10/17/2018 11:05 AM    Scribe for Treatment Team: Joellen [place], [place] 10/17/2018 11:05 AM

## 2018-10-17 NOTE — Plan of Care (Signed)
Nurse discussed anxiety, depression and coping skills with patient.  
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
## 2018-10-17 NOTE — Progress Notes (Signed)
D:  Patient's self inventory sheet, patient has poor sleep, sleep medication not helpful.  Fair appetite, normal energy level, good concentration.  Rated 7 for depression, hopeless #3, anxiety #1.  Denied withdrawals.  Denied SI.  Denied physical problem.  Denied physical pain.  Goal is be more positive about myself and what I am able to achieve.  Plans to interact with others more and help anyway I can.  My mental state is more positive today.  I am glad to be getting help.  Does have discharge plans. A:  Medications administered per MD orders.  Emotional support and encouragement given patient. R:  Patient denied SI and HI.  Denied A/V hallucinations.  Safety maintained with 15 minute checks.
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
## 2018-10-18 LAB — HEMOGLOBIN A1C
Hgb A1c MFr Bld: 5.4 % (ref 4.8–5.6)
Mean Plasma Glucose: 108 mg/dL

## 2018-10-18 MED ORDER — LORAZEPAM 0.5 MG PO TABS
0.5000 mg | ORAL_TABLET | Freq: Four times a day (QID) | ORAL | Status: DC | PRN
  Administered 2018-10-18 – 2018-10-19 (×3): 0.5 mg via ORAL
  Filled 2018-10-18 (×3): qty 1

## 2018-10-18 MED ORDER — ARIPIPRAZOLE 5 MG PO TABS
5.0000 mg | ORAL_TABLET | Freq: Every day | ORAL | Status: DC
  Administered 2018-10-19 – 2018-10-20 (×2): 5 mg via ORAL
  Filled 2018-10-18 (×4): qty 1

## 2018-10-18 NOTE — Progress Notes (Signed)
Patient ID: [name], female   DOB: [birth year], 53 y.o.   MRN: [id number]  Patient approached nurses's station stating, "where is my nurse? She's supposed to give me my medicine". Patient informed nurse was off unit admitting a peer to the unit. Patient became irate and started yelling/cussing. Patient then got on the telephone.  Writer opened medication window to attempt to assist patient. Writer stated, "Kayron, I can give you medications when you finish on the phone". Patient ignored Probation officer and continued on the phone stating, "I want to file a complaint, no one is here helping me. They have cameras too, let them look and they can see everyone is ignoring me and I need my medicine". Patient continued not to acknowledge writer, and was upset but speaking on the telephone. Writer closed the medication window and returned to nurses's station.  Patient starting yelling, "that bitch just shut the window in my face!" while still on the telephone. Patient hung up and returned to nurses's station demanding medication.  Writer returned to window and asked patient which medication she was requesting. Patient states, "whatever I have for anxiety". Patient informed Vistaril was ordered and could be provided. Patient began arguing and stated, "No, I have Ativan ordered. What do I need to do? Tear the door off this window?" Patient informed ativan only available for CIWA >10 and parameters are not met.  Patient then stopped MD in the hallway and continued to yell/cuss asking for Ativan. MD provided verbal order to give Ativan 1 mg PO now. Patient immediately calm upon receiving her medication from Probation officer. Patient also requesting Vistaril but was informed she must wait one hour. Patient states, "fine, I will yell again if I have to".  Primary nurse notified of above information.

## 2018-10-18 NOTE — Progress Notes (Addendum)
[facility] MD Progress Note  10/18/2018 3:40 PM [name]  MRN:  [id number] Subjective: patient describes persistent anxiety. Denies suicidal ideations. Denies medication side effects at this time. Objective : I have discussed case with treatment team and have met with patient. 53 year old female, presented to hospital following overdosing on Xanax and Vistaril. Describes overdose as unplanned, impulsive . She reports significant stressors,particularly related to COVID epidemic restrictions and recent job loss.  Today patient reports lingering anxiety, and stresses that anxiety is " worse than depression". She also presetned irritable today. Denies suicidal ideations.  As reviewed with RN staff, patient presented irritable earlier today , requesting Ativan PRN.  (Of note ,currently not presenting with symptoms of BZD withdrawal) As per RN staff  also presenting with  some self referential, paranoid ideations. States she felt staff members were laughing at her and making comments about her behind her back earlier. Also expresses concern that staff members could actually go to her house following her discharge as her home address is available in chart.   Responds partially to reassurance, support, and calmed down partially during session. Although irritable, no psychomotor agitation or overtly disruptive behaviors. Denies medication side effects. Denies suicidal or self injurious ideations. No seizure like activity on unit . Labs reviewed - HgbA1C 5.4, TSH 2.25 , Lipid Panel- 213.  Principal Problem:  S/P  Overdose  Diagnosis: Active Problems:   MDD (major depressive disorder), recurrent severe, without psychosis ([place])  Total Time spent with patient: 20 minutes  Past Psychiatric History:   Past Medical History:  Past Medical History:  Diagnosis Date  . Anxiety   . Chiari malformation   . COPD (chronic obstructive pulmonary disease) ([place])   . Depression   . 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 IBS (irritable bowel syndrome)   . Seizures ([place]) 10/23/14   x 2  . Shingles 11/12/14   left eye    Past Surgical History:  Procedure Laterality Date  . ABDOMINAL HYSTERECTOMY     partial 2010-Dr.Ferguson  . I&D EXTREMITY Left 07/17/2014   Procedure: IRRIGATION AND DEBRIDEMENT left index finger;  Surgeon: Leanora Cover, MD;  Location: [facility];  Service: Orthopedics;  Laterality: Left;  . NERVE, TENDON AND ARTERY REPAIR Left 07/17/2014   Procedure: NERVE, TENDON AND ARTERY REPAIR LEFT INDEX FINGER;  Surgeon: Leanora Cover, MD;  Location: [facility];  Service: Orthopedics;  Laterality: Left;  . PARTIAL HYSTERECTOMY  2006  . TUBAL LIGATION  1988   Family History:  Family History  Problem Relation Age of Onset  . Hypertension Sister   . Depression Sister   . Drug abuse Sister   . Hypertension Mother   . Osteoporosis Mother   . Diabetes Mother   . Alcohol abuse Mother   . Depression Mother   . Heart disease Father        MI  . Depression Father   . Depression Sister   . Multiple sclerosis Sister   . Heart attack Sister   . Cancer - Lung Maternal Aunt   . Leukemia Maternal Aunt   . Colon cancer Neg Hx    Family Psychiatric  History:  Social History:  Social History   Substance and Sexual Activity  Alcohol Use Yes   Comment: occasionally     Social History   Substance and Sexual Activity  Drug Use Yes  . Types: Marijuana    Social History   Socioeconomic History  . Marital status: Single  Spouse name: Herbie [place]  . Number of children: 1  . Years of education: 39  . Highest education level: Not on file  Occupational History  . Occupation: home, yard maintenance    Comment: self employed  Social Needs  . Financial resource strain: Not on file  . Food insecurity    Worry: Not on file    Inability: Not on file  . Transportation needs    Medical: Not on file    Non-medical: Not on file  Tobacco Use  . Smoking status: Current Every Day Smoker    Packs/day: 0.50     Years: 30.00    Pack years: 15.00    Types: Cigarettes  . Smokeless tobacco: Never Used  Substance and Sexual Activity  . Alcohol use: Yes    Comment: occasionally  . Drug use: Yes    Types: Marijuana  . Sexual activity: Not Currently    Birth control/protection: Surgical  Lifestyle  . Physical activity    Days per week: Not on file    Minutes per session: Not on file  . Stress: Not on file  Relationships  . Social Herbalist on phone: Not on file    Gets together: Not on file    Attends religious service: Not on file    Active member of club or organization: Not on file    Attends meetings of clubs or organizations: Not on file    Relationship status: Not on file  Other Topics Concern  . Not on file  Social History Narrative   Lives at home with husband   Caffeine use- coffee, sodas all day   Additional Social History:   Sleep: Fair  Appetite:  improving   Current Medications: Current Facility-Administered Medications  Medication Dose Route Frequency Provider Last Rate Last Dose  . acetaminophen (TYLENOL) tablet 650 mg  650 mg Oral Q6H PRN Suella Broad, FNP   650 mg at 10/18/18 0457  . albuterol (VENTOLIN HFA) 108 (90 Base) MCG/ACT inhaler 2 puff  2 puff Inhalation Q6H PRN Lindon Romp A, NP      . alum & mag hydroxide-simeth (MAALOX/MYLANTA) 200-200-20 MG/5ML suspension 30 mL  30 mL Oral Q4H PRN Burt Ek, Gayland Curry, FNP      . hydrOXYzine (ATARAX/VISTARIL) tablet 25 mg  25 mg Oral Q6H PRN Cobos, Myer Peer, MD   25 mg at 10/17/18 2006  . levETIRAcetam (KEPPRA) tablet 750 mg  750 mg Oral BID Lindon Romp A, NP   750 mg at 10/18/18 0801  . LORazepam (ATIVAN) tablet 1 mg  1 mg Oral Q6H PRN Cobos, Myer Peer, MD   1 mg at 10/18/18 1459  . magnesium hydroxide (MILK OF MAGNESIA) suspension 30 mL  30 mL Oral Daily PRN Suella Broad, FNP      . mirtazapine (REMERON) tablet 7.5 mg  7.5 mg Oral QHS Cobos, Myer Peer, MD   7.5 mg at 10/17/18 2117   . multivitamin with minerals tablet 1 tablet  1 tablet Oral Daily Cobos, Myer Peer, MD   1 tablet at 10/18/18 0801  . ondansetron (ZOFRAN-ODT) disintegrating tablet 4 mg  4 mg Oral Q6H PRN Cobos, Fernando A, MD      . sertraline (ZOLOFT) tablet 100 mg  100 mg Oral Daily Cobos, Myer Peer, MD   100 mg at 10/17/18 1600  . thiamine (VITAMIN B-1) tablet 100 mg  100 mg Oral Daily Cobos, Myer Peer, MD   100 mg at 10/18/18  0801    Lab Results:  Results for orders placed or performed during the hospital encounter of 10/16/18 (from the past 48 hour(s))  Lipid panel     Status: Abnormal   Collection Time: 10/17/18  6:06 PM  Result Value Ref Range   Cholesterol 213 (H) 0 - 200 mg/dL   Triglycerides 86 <150 mg/dL   HDL 41 >40 mg/dL   Total CHOL/HDL Ratio 5.2 RATIO   VLDL 17 0 - 40 mg/dL   LDL Cholesterol 155 (H) 0 - 99 mg/dL    Comment:        Total Cholesterol/HDL:CHD Risk Coronary Heart Disease Risk Table                     Men   Women  1/2 Average Risk   3.4   3.3  Average Risk       5.0   4.4  2 X Average Risk   9.6   7.1  3 X Average Risk  23.4   11.0        Use the calculated Patient Ratio above and the CHD Risk Table to determine the patient's CHD Risk.        ATP III CLASSIFICATION (LDL):  <100     mg/dL   Optimal  100-129  mg/dL   Near or Above                    Optimal  130-159  mg/dL   Borderline  160-189  mg/dL   High  >190     mg/dL   Very High Performed at [facility] [address], [place], [place] [address]   TSH     Status: None   Collection Time: 10/17/18  6:06 PM  Result Value Ref Range   TSH 2.255 0.350 - 4.500 uIU/mL    Comment: Performed by a 3rd Generation assay with a functional sensitivity of <=0.01 uIU/mL. Performed at [facility], [place] [address], [place], [place] [address]   Hemoglobin A1c     Status: None   Collection Time: 10/17/18  6:06 PM  Result Value Ref Range   Hgb A1c MFr Bld 5.4 4.8 - 5.6 %     Comment: (NOTE)         Prediabetes: 5.7 - 6.4         Diabetes: >6.4         Glycemic control for adults with diabetes: <7.0    Mean Plasma Glucose 108 mg/dL    Comment: (NOTE) Performed At: [facility] [place], [place] [id number] Rush Farmer MD MP:[id number]     Blood Alcohol level:  Lab Results  Component Value Date   [facility] <10 10/15/2018   ETH <10 00/86/7619    Metabolic Disorder Labs: Lab Results  Component Value Date   HGBA1C 5.4 10/17/2018   MPG 108 10/17/2018   No results found for: PROLACTIN Lab Results  Component Value Date   CHOL 213 (H) 10/17/2018   TRIG 86 10/17/2018   HDL 41 10/17/2018   CHOLHDL 5.2 10/17/2018   VLDL 17 10/17/2018   LDLCALC 155 (H) 10/17/2018   LDLCALC 102 (H) 06/01/2017    Physical Findings: AIMS: Facial and Oral Movements Muscles of Facial Expression: None, normal Lips and Perioral Area: None, normal Jaw: None, normal Tongue: None, normal,Extremity Movements Upper (arms, wrists, hands, fingers): None, normal Lower (legs, knees, ankles, toes): None, normal, Trunk Movements Neck, shoulders,  hips: None, normal, Overall Severity Severity of abnormal movements (highest score from questions above): None, normal Incapacitation due to abnormal movements: None, normal Patient's awareness of abnormal movements (rate only patient's report): No Awareness, Dental Status Current problems with teeth and/or dentures?: No Does patient usually wear dentures?: No  CIWA:  CIWA-Ar Total: 1 COWS:  COWS Total Score: 1  Musculoskeletal: Strength & Muscle Tone: within normal limits Gait & Station: normal Patient leans: N/A  Psychiatric Specialty Exam: Physical Exam  ROS no chest pain, no shortness of breath, no cough, no vomiting, no fever, no chills  Blood pressure 108/77, pulse (!) 57, temperature 98.1 F (36.7 C), temperature source Oral, resp. rate 16, height 5' 7"  (1.702 m), weight 47.6 kg, SpO2 100 %.Body mass  index is 16.45 kg/m.  General Appearance: improved grooming   Eye Contact:  Good  Speech:  Normal Rate  Volume:  Normal  Mood:  states she is feeling irritated today  Affect:  congruent , irritable   Thought Process:  Linear and Descriptions of Associations: Intact  Orientation:  Other:  fully alert and attentive   Thought Content:  paranoid self referentail ideations as described above , does not appear internally preoccupied, denies hallucinations  Suicidal Thoughts:  No currently denies suicidal or self injurious ideations  Homicidal Thoughts:  No  Memory:  recent and remote grossly intact   Judgement:  Fair  Insight:  Fair  Psychomotor Activity:  Normal- no agitation or restlessness   Concentration:  Concentration: Good and Attention Span: Good  Recall:  Good  Fund of Knowledge:  Good  Language:  Good  Akathisia:  Negative  Handed:  Right  AIMS (if indicated):     Assets:  Communication Skills Desire for Improvement Resilience  ADL's:  Intact  Cognition:  WNL  Sleep:  Number of Hours: 4.75   Assessment -  53 year old female, presented to hospital following overdosing on Xanax and Vistaril. Describes overdose as unplanned, impulsive . Endorses depression, anxiety, some neuro-vegetative symptoms. She attributes increased symptoms to significant stressors,particularly related to COVID epidemic restrictions and recent job loss.   Today patient presents vaguely irritable , angry. As discussed with RN staff, had earlier stated that some of the staff was laughing at her and at this time describes some paranoid ideations that staff members could go to her home after she is discharged. She also ruminates about recent national episodes of civil unrest , COVID epidemic. She is not presenting with symptoms of BZD WDL, but describes significant anxiety. No tremors, no diaphoresis, no restlessness, vitals stable. Denies prior history of psychotic symptoms and does not endorse prior history  of mania or hypomania. She had been treated with Abilify in the past, and does not remember having had side effects.  Treatment Plan Summary: Daily contact with patient to assess and evaluate symptoms and progress in treatment, Medication management, Plan inpatient treatment  and medications as below  Encourage group and milieu participation Continue Keppra 750 mgrs BID for history of seizure disorder  Continue Zoloft 100 mgrs QDAY for depression, anxiety Continue Remeron 7.5 mgrs QHS for insomnia, depression, anxiety Start Abilify 5 mgrs QDAY for mood , psychosis  Continue Ativan PRN for anxiety as needed and for  potential BZD WDL symptoms Treatment team working on disposition planning options Jenne Campus, MD 10/18/2018, 3:40 PM

## 2018-10-18 NOTE — Progress Notes (Signed)
D: Pt was in dayroom upon initial approach.  Pt presents with anxious, depressed affect and mood.  She reports her day was "all right" and her goal is "trying to get out tomorrow."  She reports she will discuss potential discharge date with provider tomorrow.  Pt denies SI/HI, denies hallucinations, denies pain.  Pt has been visible in milieu interacting with peers and staff appropriately.      A: Introduced self to pt.  Met with pt 1:1.  Actively listened to pt and offered support and encouragement.  Medications administered per order.  PRN medication administered for anxiety.  15 minute safety checks in place.  R: Pt is safe on the unit.  Pt is compliant with medications.  Pt verbally contracts for safety.  Will continue to monitor and assess.

## 2018-10-18 NOTE — Progress Notes (Signed)
Patient asked RN to retrieve cash from her locker. RN counted $10 in front of patient. Patient only wanted $2, but Probation officer and tech made patient aware that we would not be going back and forth to and from her locker. Patient then threw the $2 at [place]. Money was put back in her locker. Patient was continuing to staff split even after Probation officer and patient met with MD. Patient said "well you are doing all the work and everyone else is just sitting around." [facility] notified to speak with patient because patient was saying she needed all staff members names and positions to make a complaint. Patient seemed paranoid, thinking staff will want to "retaliate" and come to her home to attack her.

## 2018-10-18 NOTE — Plan of Care (Signed)
  Problem: Education: Goal: Emotional status will improve Outcome: Progressing Goal: Verbalization of understanding the information provided will improve Outcome: Progressing   Problem: Activity: Goal: Interest or engagement in activities will improve Outcome: Progressing Goal: Sleeping patterns will improve Outcome: Progressing   Problem: Coping: Goal: Ability to verbalize frustrations and anger appropriately will improve Outcome: Progressing

## 2018-10-19 MED ORDER — MIRTAZAPINE 15 MG PO TABS
15.0000 mg | ORAL_TABLET | Freq: Every day | ORAL | Status: DC
  Administered 2018-10-19: 15 mg via ORAL
  Filled 2018-10-19 (×3): qty 1

## 2018-10-19 NOTE — Progress Notes (Signed)
D: Pt was in bed in her room upon initial approach.  Pt presents with anxious, depressed affect and mood.  She reports her day was "all right."  Her goal is to "get some sleep."  Forwards little information tonight and has been in her room for the majority of the evening.  Pt denies SI/HI, denies hallucinations, denies pain.    A: Met with pt 1:1.  Actively listened to pt and offered support and encouragement. Medications administered per order.  PRN medication administered for anxiety.  15 minute safety checks in place.  R: Pt is safe on the unit.  Pt is compliant with medications.  Pt verbally contracts for safety.  Will continue to monitor and assess.

## 2018-10-19 NOTE — Plan of Care (Signed)
Progress note  D: pt found in her room; compliant with medication administration. Pt states she is feeling better than yesterday. Pt states she feels bad for reacting the way that she did. Pt expresses readiness for discharge. Pt states she was lied to about her belongings in her locker which caused her angst. Pt explained the policy. Pt acceptance. Pt denies si/hi/ah/vh and verbally agrees to approach staff if these become apparent or before harming himself/others while at [facility]: Pt provided support and encouragement. Pt given medication per protocol and standing orders. Q38m safety checks implemented and continued.  R: Pt safe on the unit. Will continue to monitor.  Pt progressing in the following metrics  Problem: Education: Goal: Knowledge of [place] [facility] Education information/materials will improve Outcome: Progressing Goal: Mental status will improve Outcome: Progressing   Problem: Coping: Goal: Ability to demonstrate self-control will improve Outcome: Progressing

## 2018-10-19 NOTE — Progress Notes (Signed)
Adult Psychoeducational Group Note  Date:  10/19/2018 Time:  10:51 AM  Group Topic/Focus:  Goals Group:   The focus of this group is to help patients establish daily goals to achieve during treatment and discuss how the patient can incorporate goal setting into their daily lives to aide in recovery.  Participation Level:  Active  Participation Quality:  Appropriate  Affect:  Appropriate  Cognitive:  Alert  Insight: Appropriate  Engagement in Group:  Engaged  Modes of Intervention:  Discussion  Additional Comments:  Pt attended group and participated in discussion.  [name] 10/19/2018, 10:51 AM

## 2018-10-19 NOTE — Progress Notes (Signed)
[facility] MD Progress Note  10/19/2018 11:26 AM [name]  MRN:  [id number] Subjective: patient reports " I feel better today". She denies medication side effects at this time. Denies suicidal ideations. Objective : I have discussed case with treatment team and have met with patient. 53 year old female, presented to hospital following overdosing on Xanax and Vistaril. Describes overdose as unplanned, impulsive . She reports significant stressors,particularly related to COVID epidemic restrictions and recent job loss.  Presents less irritable today, calmer. No overt paranoid or self referential ideations noted today. States, referring to yesterday ( please see yesterday's progress note)- " I was just upset because I felt they were not letting me speak to my Nurse ". Today does not mention concerns that staff members could actually go to her home following her discharge. States " I guess I've been anxious with all the stuff that is going on in the world right now, people are doing whatever they want to do these days". No overt delusion expressed . Denies medication side effects at this time. Denies SI/ contracts for safety on unit   Principal Problem:  S/P  Overdose  Diagnosis: Active Problems:   MDD (major depressive disorder), recurrent severe, without psychosis ([place])  Total Time spent with patient: 20 minutes  Past Psychiatric History:   Past Medical History:  Past Medical History:  Diagnosis Date  . Anxiety   . Chiari malformation   . COPD (chronic obstructive pulmonary disease) ([place])   . Depression   . 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 IBS (irritable bowel syndrome)   . Seizures ([place]) 10/23/14   x 2  . Shingles 11/12/14   left eye    Past Surgical History:  Procedure Laterality Date  . ABDOMINAL HYSTERECTOMY     partial 2010-Dr.Ferguson  . I&D EXTREMITY Left 07/17/2014   Procedure: IRRIGATION AND DEBRIDEMENT left index finger;  Surgeon: Leanora Cover, MD;  Location: [place];   Service: Orthopedics;  Laterality: Left;  . NERVE, TENDON AND ARTERY REPAIR Left 07/17/2014   Procedure: NERVE, TENDON AND ARTERY REPAIR LEFT INDEX FINGER;  Surgeon: Leanora Cover, MD;  Location: [place] [facility];  Service: Orthopedics;  Laterality: Left;  . PARTIAL HYSTERECTOMY  2006  . TUBAL LIGATION  1988   Family History:  Family History  Problem Relation Age of Onset  . Hypertension Sister   . Depression Sister   . Drug abuse Sister   . Hypertension Mother   . Osteoporosis Mother   . Diabetes Mother   . Alcohol abuse Mother   . Depression Mother   . Heart disease Father        MI  . Depression Father   . Depression Sister   . Multiple sclerosis Sister   . Heart attack Sister   . Cancer - Lung Maternal Aunt   . Leukemia Maternal Aunt   . Colon cancer Neg Hx    Family Psychiatric  History:  Social History:  Social History   Substance and Sexual Activity  Alcohol Use Yes   Comment: occasionally     Social History   Substance and Sexual Activity  Drug Use Yes  . Types: Marijuana    Social History   Socioeconomic History  . Marital status: Single    Spouse name: Herbie [place]  . Number of children: 1  . Years of education: 1  . Highest education level: Not on file  Occupational History  . Occupation: home, yard maintenance    Comment: self employed  Social Needs  . Financial resource strain: Not on file  . Food insecurity    Worry: Not on file    Inability: Not on file  . Transportation needs    Medical: Not on file    Non-medical: Not on file  Tobacco Use  . Smoking status: Current Every Day Smoker    Packs/day: 0.50    Years: 30.00    Pack years: 15.00    Types: Cigarettes  . Smokeless tobacco: Never Used  Substance and Sexual Activity  . Alcohol use: Yes    Comment: occasionally  . Drug use: Yes    Types: Marijuana  . Sexual activity: Not Currently    Birth control/protection: Surgical  Lifestyle  . Physical activity    Days per week: Not on file    Minutes  per session: Not on file  . Stress: Not on file  Relationships  . Social Herbalist on phone: Not on file    Gets together: Not on file    Attends religious service: Not on file    Active member of club or organization: Not on file    Attends meetings of clubs or organizations: Not on file    Relationship status: Not on file  Other Topics Concern  . Not on file  Social History Narrative   Lives at home with husband   Caffeine use- coffee, sodas all day   Additional Social History:   Sleep: Fair  Appetite:  improving   Current Medications: Current Facility-Administered Medications  Medication Dose Route Frequency Provider Last Rate Last Dose  . acetaminophen (TYLENOL) tablet 650 mg  650 mg Oral Q6H PRN Suella Broad, FNP   650 mg at 10/18/18 0457  . albuterol (VENTOLIN HFA) 108 (90 Base) MCG/ACT inhaler 2 puff  2 puff Inhalation Q6H PRN Lindon Romp A, NP      . alum & mag hydroxide-simeth (MAALOX/MYLANTA) 200-200-20 MG/5ML suspension 30 mL  30 mL Oral Q4H PRN Burt Ek, Gayland Curry, FNP      . ARIPiprazole (ABILIFY) tablet 5 mg  5 mg Oral Daily , Myer Peer, MD   5 mg at 10/19/18 0811  . levETIRAcetam (KEPPRA) tablet 750 mg  750 mg Oral BID Lindon Romp A, NP   750 mg at 10/19/18 0811  . LORazepam (ATIVAN) tablet 0.5 mg  0.5 mg Oral Q6H PRN , Myer Peer, MD   0.5 mg at 10/18/18 2156  . mirtazapine (REMERON) tablet 7.5 mg  7.5 mg Oral QHS , Myer Peer, MD   7.5 mg at 10/18/18 2156  . multivitamin with minerals tablet 1 tablet  1 tablet Oral Daily , Myer Peer, MD   1 tablet at 10/19/18 [phone]  . sertraline (ZOLOFT) tablet 100 mg  100 mg Oral Daily , Myer Peer, MD   100 mg at 10/18/18 2156  . thiamine (VITAMIN B-1) tablet 100 mg  100 mg Oral Daily , Myer Peer, MD   100 mg at 10/19/18 2119    Lab Results:  Results for orders placed or performed during the hospital encounter of 10/16/18 (from the past 48 hour(s))  Lipid panel      Status: Abnormal   Collection Time: 10/17/18  6:06 PM  Result Value Ref Range   Cholesterol 213 (H) 0 - 200 mg/dL   Triglycerides 86 <150 mg/dL   HDL 41 >40 mg/dL   Total CHOL/HDL Ratio 5.2 RATIO   VLDL 17 0 - 40 mg/dL   LDL Cholesterol  155 (H) 0 - 99 mg/dL    Comment:        Total Cholesterol/HDL:CHD Risk Coronary Heart Disease Risk Table                     Men   Women  1/2 Average Risk   3.4   3.3  Average Risk       5.0   4.4  2 X Average Risk   9.6   7.1  3 X Average Risk  23.4   11.0        Use the calculated Patient Ratio above and the CHD Risk Table to determine the patient's CHD Risk.        ATP III CLASSIFICATION (LDL):  <100     mg/dL   Optimal  100-129  mg/dL   Near or Above                    Optimal  130-159  mg/dL   Borderline  160-189  mg/dL   High  >190     mg/dL   Very High Performed at [facility] [address] [place], [place] [address]   TSH     Status: None   Collection Time: 10/17/18  6:06 PM  Result Value Ref Range   TSH 2.255 0.350 - 4.500 uIU/mL    Comment: Performed by a 3rd Generation assay with a functional sensitivity of <=0.01 uIU/mL. Performed at [facility], [place] [address] [place], [place] [address]   Hemoglobin A1c     Status: None   Collection Time: 10/17/18  6:06 PM  Result Value Ref Range   Hgb A1c MFr Bld 5.4 4.8 - 5.6 %    Comment: (NOTE)         Prediabetes: 5.7 - 6.4         Diabetes: >6.4         Glycemic control for adults with diabetes: <7.0    Mean Plasma Glucose 108 mg/dL    Comment: (NOTE) Performed At: [facility] [place], [place] [id number] Rush Farmer MD TD:[id number]     Blood Alcohol level:  Lab Results  Component Value Date   [facility] <10 10/15/2018   ETH <10 26/20/3559    Metabolic Disorder Labs: Lab Results  Component Value Date   HGBA1C 5.4 10/17/2018   MPG 108 10/17/2018   No results found for: PROLACTIN Lab Results  Component  Value Date   CHOL 213 (H) 10/17/2018   TRIG 86 10/17/2018   HDL 41 10/17/2018   CHOLHDL 5.2 10/17/2018   VLDL 17 10/17/2018   LDLCALC 155 (H) 10/17/2018   LDLCALC 102 (H) 06/01/2017    Physical Findings: AIMS: Facial and Oral Movements Muscles of Facial Expression: None, normal Lips and Perioral Area: None, normal Jaw: None, normal Tongue: None, normal,Extremity Movements Upper (arms, wrists, hands, fingers): None, normal Lower (legs, knees, ankles, toes): None, normal, Trunk Movements Neck, shoulders, hips: None, normal, Overall Severity Severity of abnormal movements (highest score from questions above): None, normal Incapacitation due to abnormal movements: None, normal Patient's awareness of abnormal movements (rate only patient's report): No Awareness, Dental Status Current problems with teeth and/or dentures?: No Does patient usually wear dentures?: No  CIWA:  CIWA-Ar Total: 1 COWS:  COWS Total Score: 1  Musculoskeletal: Strength & Muscle Tone: within normal limits Gait & Station: normal Patient leans: N/A  Psychiatric Specialty Exam: Physical Exam  ROS no  chest pain, no shortness of breath, no cough, no vomiting, no fever, no chills  Blood pressure 105/78, pulse 72, temperature 98.4 F (36.9 C), temperature source Oral, resp. rate 16, height 5' 7" (1.702 m), weight 47.6 kg, SpO2 100 %.Body mass index is 16.45 kg/m.  General Appearance: improved grooming   Eye Contact:  Good  Speech:  Normal Rate  Volume:  Normal  Mood:  partially improved, less angry/less irritable  Affect:  vaguely anxious, smiles briefly at times   Thought Process:  Linear and Descriptions of Associations: Intact  Orientation:  Other:  fully alert and attentive   Thought Content:  no delusions expressed at this time, no hallucinations, does not appear internally preoccupied  Suicidal Thoughts:  No currently denies suicidal or self injurious ideations  Homicidal Thoughts:  No  Memory:  recent  and remote grossly intact   Judgement:  Fair/ improving  Insight:  Fair  Psychomotor Activity:  Normal- no agitation or restlessness   Concentration:  Concentration: Good and Attention Span: Good  Recall:  Good  Fund of Knowledge:  Good  Language:  Good  Akathisia:  Negative  Handed:  Right  AIMS (if indicated):     Assets:  Communication Skills Desire for Improvement Resilience  ADL's:  Intact  Cognition:  WNL  Sleep:  Number of Hours: 6.75   Assessment -  53 year old female, presented to hospital following overdosing on Xanax and Vistaril. Describes overdose as unplanned, impulsive . Endorses depression, anxiety, some neuro-vegetative symptoms. She attributes increased symptoms to significant stressors,particularly related to COVID epidemic restrictions and recent job loss.   Patient presents less irritable, less guarded today. She reports some increased anxiety, which she attributes mainly to worry that " the world is not going well " and refers  national/word events such as COVID epidemic and recent protests, but today does not express overt self referential or paranoid ideations  Treatment Plan Summary: Daily contact with patient to assess and evaluate symptoms and progress in treatment, Medication management, Plan inpatient treatment  and medications as below  Treatment plan reviewed today 7/10 Encourage group and milieu participation Continue Keppra 750 mgrs BID for history of seizure disorder  Continue Zoloft 100 mgrs QDAY for depression, anxiety Increase Remeron to 15  mgrs QHS for insomnia, depression, anxiety Continue  Abilify 5 mgrs QDAY for mood , psychosis  Continue Ativan PRN for anxiety as needed and for  potential BZD WDL symptoms Treatment team working on disposition planning options Jenne Campus, MD 10/19/2018, 11:26 AM   Patient ID: Gaylord Shih, female   DOB: 05-31-1965, 53 y.o.   MRN: [id number]

## 2018-10-20 MED ORDER — SERTRALINE HCL 100 MG PO TABS: 100.0000 mg | ORAL_TABLET | Freq: Every day | ORAL | 0 refills | Status: DC

## 2018-10-20 MED ORDER — MIRTAZAPINE 15 MG PO TABS: 15.0000 mg | ORAL_TABLET | Freq: Every day | ORAL | 0 refills | Status: DC

## 2018-10-20 MED ORDER — ARIPIPRAZOLE 5 MG PO TABS: 5.0000 mg | ORAL_TABLET | Freq: Every day | ORAL | 0 refills | Status: DC

## 2018-10-20 NOTE — BHH Suicide Risk Assessment (Signed)
[place] INPATIENT:  Family/Significant Other Suicide Prevention Education  Suicide Prevention Education:  Education Completed; Seniyah Esker [phone],  (name of family member/significant other) has been identified by the patient as the family member/significant other with whom the patient will be residing, and identified as the person(s) who will aid the patient in the event of a mental health crisis (suicidal ideations/suicide attempt).  With written consent from the patient, the family member/significant other has been provided the following suicide prevention education, prior to the and/or following the discharge of the patient.  The suicide prevention education provided includes the following:  Suicide risk factors  Suicide prevention and interventions  National Suicide Hotline telephone number  [facility] assessment telephone number  [facility] Emergency Assistance [place] and/or Residential Mobile Crisis Unit telephone number  Request made of family/significant other to:  Remove weapons (e.g., guns, rifles, knives), all items previously/currently identified as safety concern.    Remove drugs/medications (over-the-counter, prescriptions, illicit drugs), all items previously/currently identified as a safety concern.  The family member/significant other verbalizes understanding of the suicide prevention education information provided.  The family member/significant other agrees to remove the items of safety concern listed above.  Patient's ex-husband states he has seen patient do some "pretty violent things to herself" in the past.  There are no longer firearms in the home, as she gave away her shotguns to her son.  Ex-husband thinks she cannot function in society, cannot keep a job, has no friends, needs to apply for disability.  "Her eyes actually change color when her mood changes."  Maretta Los 10/20/2018, 12:07 PM

## 2018-10-20 NOTE — Progress Notes (Signed)
[facility] NOVEL CORONAVIRUS (COVID-19) DAILY CHECK-OFF SYMPTOMS - answer yes or no to each - every day NO YES  Have you had a fever in the past 24 hours?  . Fever (Temp > 37.80C / 100F) X   Have you had any of these symptoms in the past 24 hours? . New Cough .  Sore Throat  .  Shortness of Breath .  Difficulty Breathing .  Unexplained Body Aches   X   Have you had any one of these symptoms in the past 24 hours not related to allergies?   . Runny Nose .  Nasal Congestion .  Sneezing   X   If you have had runny nose, nasal congestion, sneezing in the past 24 hours, has it worsened?  X   EXPOSURES - check yes or no X   Have you traveled outside the state in the past 14 days?  X   Have you been in contact with someone with a confirmed diagnosis of COVID-19 or PUI in the past 14 days without wearing appropriate PPE?  X   Have you been living in the same home as a person with confirmed diagnosis of COVID-19 or a PUI (household contact)?    X   Have you been diagnosed with COVID-19?    X              What to do next: Answered NO to all: Answered YES to anything:   Proceed with unit schedule Follow the [facility] Inpatient Flowsheet.   

## 2018-10-20 NOTE — BHH Suicide Risk Assessment (Signed)
[facility] Discharge Suicide Risk Assessment   Principal Problem: Depression Discharge Diagnoses: Active Problems:   MDD (major depressive disorder), recurrent severe, without psychosis ([place])   Total Time spent with patient: 30 minutes  Musculoskeletal: Strength & Muscle Tone: within normal limits Gait & Station: normal Patient leans: N/A  Psychiatric Specialty Exam: ROS denies headache, no chest pain, no shortness of breath, no nausea, no vomiting , no fever or chills   Blood pressure 104/80, pulse 77, temperature 98.7 F (37.1 C), temperature source Oral, resp. rate 16, height 5\' 7"  (1.702 m), weight 47.6 kg, SpO2 100 %.Body mass index is 16.45 kg/m.  General Appearance: Well Groomed  Eye Contact::  Good  Speech:  Normal Rate409  Volume:  Normal  Mood:  reports " my mood is good today", presents improved compared to admission  Affect:  Appropriate and not irritable, less labile today  Thought Process:  Linear and Descriptions of Associations: Intact  Orientation:  Other:  fully alert and attentive  Thought Content:  no hallucinations, no delusions expressed at this time, no self referential ideations expressed currently   Suicidal Thoughts:  No denies any suicidal or self injurious ideations and currently presents future oriented, denies homicidal or violent ideations  Homicidal Thoughts:  No  Memory:  recent and remote grossly intact   Judgement:  Other:  improving   Insight:  improving   Psychomotor Activity:  Normal  Concentration:  Good  Recall:  Good  Fund of Knowledge:Good  Language: Good  Akathisia:  Negative  Handed:  Right  AIMS (if indicated):     Assets:  Desire for Improvement Resilience  Sleep:  Number of Hours: 6  Cognition: WNL  ADL's:  Intact   Mental Status Per Nursing Assessment::   On Admission:  NA  Demographic Factors:  22, single, lives alone, has an adult son  Loss Factors: Recently lost her job, stressors inherent to COVID pandemic/world  events  Historical Factors: History of depression and anxiety  Risk Reduction Factors:   Sense of responsibility to family and Positive coping skills or problem solving skills  Continued Clinical Symptoms:  At this time patient is alert, attentive, well groomed, mood described as improved , states she is feeling better than on admission. Affect is less anxious and more reactive today. Denies suicidal or self injurious ideations, denies homicidal or violent ideations, denies hallucinations, and no delusions expressed at this time, future oriented, states she plans to seek a new job in the near future , and may also opt to go to college .  No disruptive or agitated behaviors on unit.  Denies medication side effects. Currently tolerating Remeron , Zoloft ,Abilify well thus far. Side effects discussed including risk of akathisia, movement/motor and metabolic side effects associated with Abilify. She is aware that Remeron can cause sedation and is aware she should not drive if feeling sedated .   Cognitive Features That Contribute To Risk:  No gross cognitive deficits noted upon discharge. Is alert , attentive, and oriented x 3   Suicide Risk:  Mild:  Suicidal ideation of limited frequency, intensity, duration, and specificity.  There are no identifiable plans, no associated intent, mild dysphoria and related symptoms, good self-control (both objective and subjective assessment), few other risk factors, and identifiable protective factors, including available and accessible social support.  Follow-up Information    [facility] Follow up on 10/29/2018.   Why: Telephoic follow up appointment with Dr. Nicki Reaper is Monday, 7/20 at 11:00a.  Please call  within 24 hours of discharge to provide office with your phone number for the appt.  Contact information: [address] [place] [phone]          Plan Of Care/Follow-up recommendations:   Activity:  as tolerated  Diet:  regular Tests:  NA Other:  See below  Patient expresses readiness for discharge and there are no current grounds for ongoing involuntary commitment. She is leaving unit in good spirits .  Plans to return home. Follow up as above .   Craige CottaFernando A Cobos, MD 10/20/2018, 10:53 AM

## 2018-10-20 NOTE — Progress Notes (Signed)
Pt discharged to lobby. Pt was stable and appreciative at that time. All papers  were given and valuables returned. Verbal understanding expressed. Denies SI/HI and A/VH. Pt given opportunity to express concerns and ask questions. °

## 2018-10-20 NOTE — Progress Notes (Signed)
  [facility] Adult Case Management Discharge Plan :  Will you be returning to the same living situation after discharge:  Yes,  lives alone At discharge, do you have transportation home?: Yes,  arranged by patient Do you have the ability to pay for your medications: No.  No insurance  Release of information consent forms completed and turned in to Medical Records by CSW.   Patient to Follow up at: Follow-up Information    [facility] Follow up on 10/29/2018.   Why: Telephoic follow up appointment with Dr. Nicki Reaper is Monday, 7/20 at 11:00a.  Please call within 24 hours of discharge to provide office with your phone number for the appt.  Contact information: [address] [address] [place] [address] [phone]          Next level of care provider has access to [facility] and Suicide Prevention discussed: Yes,  with husband  Have you used any form of tobacco in the last 30 days? (Cigarettes, Smokeless Tobacco, Cigars, and/or Pipes): No  Has patient been referred to the Quitline?: N/A patient is not a smoker  Patient has been referred for addiction treatment: N/A  Maretta Los, LCSW 10/20/2018, 12:04 PM

## 2018-10-20 NOTE — B[facility] Group Notes (Signed)
LCSW Group Therapy Note  10/20/2018   10:00-11:00am   Type of Therapy and Topic:  Group Therapy: Anger Cues and Responses  Participation Level:  Minimal   Description of Group:   In this group, patients learned how to recognize the physical, cognitive, emotional, and behavioral responses they have to anger-provoking situations.  They identified a recent time they became angry and how they reacted.  They analyzed how their reaction was possibly beneficial and how it was possibly unhelpful.  The group discussed a variety of healthier coping skills that could help with such a situation in the future.    Therapeutic Goals: 1. Patients will remember their last incident of anger and how they felt emotionally and physically, what their thoughts were at the time, and how they behaved. 2. Patients will identify how their behavior at that time worked for them, as well as how it worked against them. 3. Patients will explore possible new behaviors to use in future anger situations. 4. Patients will learn that anger itself is normal and cannot be eliminated, and that healthier reactions can assist with resolving conflict rather than worsening situations.  Summary of Patient Progress:  The patient shared that her most recent time of anger was yesterday and said her anger was caused by a dispute with another patient and she chose to stand up for herself.  Later in the group, that other patient decided to apologize to her, and she accepted the apology.  Patient was involved in side conversation numerous times during group despite being asked to stop.  Therapeutic Modalities:   Cognitive Behavioral Therapy  Maretta Los

## 2018-10-20 NOTE — Progress Notes (Signed)
D. Pt friendly upon approach- visible in the milieu interacting well with peers and staff. Per pt's self inventory, pt rated her depression, hopelessness and anxiety all 0's. Pt writes that her goal today is "work on mindfulness, going home".Pt currently denies SI/HI and AVH  A. Labs and vitals monitored. Pt compliant with medications. Pt supported emotionally and encouraged to express concerns and ask questions.   R. Pt remains safe with 15 minute checks. Will continue POC.

## 2018-10-25 ENCOUNTER — Telehealth: Payer: Self-pay | Admitting: Family Medicine

## 2018-10-25 DIAGNOSIS — F410 Panic disorder [episodic paroxysmal anxiety] without agoraphobia: Secondary | ICD-10-CM

## 2018-10-25 NOTE — Telephone Encounter (Signed)
Patient has follow-up virtual visit on the 20th  Patient was in the hospital for severe depression issues  We will continue to be her primary care doctor.  Please let her know that.  We will do the follow-up for the hospital visit on the 20th.  But I am highly recommending that we also set patient up with psychiatry.   Nurses-please refer to Dr. Harrington Challenger.  This patient urgently needs psychiatric follow-up it is not appropriate for [place] to maintain psychiatric care long-term-this portion of her health care needs to come from a psychiatrist

## 2018-10-26 ENCOUNTER — Other Ambulatory Visit: Payer: Self-pay

## 2018-10-26 NOTE — Addendum Note (Signed)
Addended by: Dairl Ponder on: 10/26/2018 09:49 AM   Modules accepted: Orders

## 2018-10-26 NOTE — Telephone Encounter (Signed)
Referral ordered in Epic. Left message to return call to notify patient. 

## 2018-10-29 ENCOUNTER — Other Ambulatory Visit: Payer: Self-pay

## 2018-10-29 ENCOUNTER — Ambulatory Visit (INDEPENDENT_AMBULATORY_CARE_PROVIDER_SITE_OTHER): Payer: Self-pay | Admitting: Family Medicine

## 2018-10-29 DIAGNOSIS — F332 Major depressive disorder, recurrent severe without psychotic features: Secondary | ICD-10-CM

## 2018-10-29 NOTE — Telephone Encounter (Signed)
Patient notified and stated she is being cared for by [facility] and would like to stay with them

## 2018-10-29 NOTE — Progress Notes (Signed)
   Subjective:    Patient ID: [name], female    DOB: 1965-08-11, 53 y.o.   MRN: [id number]  HPI Hospital follow-up Patient calls for a follow up on depression and recent hospitalization. Patient states she is currently being treated by [facility] and would like to stay with then for her psychiatric issues The patient was in the behavioral health hospital for several days because of suicidal ideation and depression she is now out on medication.  She is not on any Xanax She is trying to do the best job she can keeping things under good control She states at times she feels a little lightheaded during the day and at times feels a little groggy in the morning she states she will be setting herself up to do [facility] Virtual Visit via Video Note  I connected with [name] on 10/29/18 at 11:00 AM EDT by a video enabled telemedicine application and verified that I am speaking with the correct person using two identifiers.  Location: Patient: home Provider: office   I discussed the limitations of evaluation and management by telemedicine and the availability of in person appointments. The patient expressed understanding and agreed to proceed.  History of Present Illness:    Observations/Objective:   Assessment and Plan:   Follow Up Instructions:    I discussed the assessment and treatment plan with the patient. The patient was provided an opportunity to ask questions and all were answered. The patient agreed with the plan and demonstrated an understanding of the instructions.   The patient was advised to call back or seek an in-person evaluation if the symptoms worsen or if the condition fails to improve as anticipated.  I provided 15 minutes of non-face-to-face time during this encounter.      Review of Systems  Constitutional: Negative for activity change and appetite change.  HENT: Negative for congestion and rhinorrhea.   Respiratory: Negative for cough  and shortness of breath.   Cardiovascular: Negative for chest pain and leg swelling.  Gastrointestinal: Negative for abdominal pain, nausea and vomiting.  Skin: Negative for color change.  Neurological: Negative for dizziness and weakness.  Psychiatric/Behavioral: Negative for agitation and confusion.       Objective:   Physical Exam   Today's visit was via telephone Physical exam was not possible for this visit      Assessment & Plan:  Depression Not suicidal Under fair control I highly recommend that [facility] She will call them this week to get a follow-up appointment She will let us know later this week if she was able to do so and when it will be so we can determine if we need to give any refills ideally I would like her psychiatric medicines to be prescribed by [facility] if they are following her

## 2018-10-30 ENCOUNTER — Telehealth: Payer: Self-pay | Admitting: Family Medicine

## 2018-10-30 NOTE — Telephone Encounter (Signed)
Patient called to let us know she made an appt with [facility] for August 11th.  Said Dr. Nicki Reaper wanted to know this information.

## 2018-10-30 NOTE — Telephone Encounter (Signed)
Pt contacted and verbalized understanding.  

## 2018-10-30 NOTE — Telephone Encounter (Signed)
Tinea-patient recently in behavioral health hospital for depression related issues When she was released they gave her a month supply of all of her medicines She should have enough medicine to last her through that appointment with [facility] Should she have any needs let us know

## 2018-10-30 NOTE — Telephone Encounter (Signed)
FYI
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
## 2018-11-29 ENCOUNTER — Other Ambulatory Visit: Payer: Self-pay | Admitting: Family Medicine

## 2018-11-29 ENCOUNTER — Telehealth: Payer: Self-pay | Admitting: Family Medicine

## 2018-11-29 NOTE — Telephone Encounter (Signed)
Med not on med list. It was marked off on 10/21/18 and last ov note on 7/20 states she is not taking any xanax.

## 2018-11-29 NOTE — Telephone Encounter (Signed)
See pt call. Left message to return call with pt.

## 2018-11-29 NOTE — Telephone Encounter (Signed)
When the patient was in the psychiatric hospital it was recommended for her to stop his Xanax. It looks like she got a Xanax refill that she had leftover after getting out from the hospital Please find out from the patient is she currently taking medication?

## 2018-11-29 NOTE — Telephone Encounter (Signed)
Left message to return call 

## 2018-11-29 NOTE — Telephone Encounter (Signed)
Error

## 2018-11-29 NOTE — Telephone Encounter (Signed)
When she was released from the psychiatric hospital they told her to stay off of the Xanax. It looks like from the drug registry that she had 1 refill left which she got filled  So therefore is she currently taking Xanax?  If so how? This is not a medicine that we will be able to fill long-term We may well need to reduce the dose and it was discussed with her on her last phone visit to [facility] If they decide to prescribe Xanax in the future that is up to them But that is not something that we will be doing long-term  Please find out additional information

## 2018-11-29 NOTE — Telephone Encounter (Signed)
Patient is requesting refills on medications that are prescribe by her physiciatric that was filled on 7/11. She states has appointment on 8/25 and want wanting refills on mirtazapine 15 mg and xanax 1 mg .Because she is completely out. [place]- number [phone]

## 2018-11-30 ENCOUNTER — Other Ambulatory Visit: Payer: Self-pay | Admitting: Family Medicine

## 2018-11-30 MED ORDER — ALPRAZOLAM 0.5 MG PO TABS: ORAL_TABLET | ORAL | 0 refills | Status: DC

## 2018-11-30 NOTE — Telephone Encounter (Signed)
Pt states she takes xanax 1mg  qhs to help sleep because visteril does not help. And she takes remeron 15mg  one qhs that she needs refill on.

## 2018-11-30 NOTE — Telephone Encounter (Signed)
Tried to call again before we left and no answer

## 2018-11-30 NOTE — Telephone Encounter (Signed)
Left message to return call 

## 2018-11-30 NOTE — Telephone Encounter (Signed)
May have refill on Remeron for 1 month I will be reducing her Xanax then long-term we will not be filling this We will send in a temporary supply

## 2018-12-04 ENCOUNTER — Other Ambulatory Visit: Payer: Self-pay

## 2018-12-04 ENCOUNTER — Ambulatory Visit (INDEPENDENT_AMBULATORY_CARE_PROVIDER_SITE_OTHER): Payer: Self-pay | Admitting: Family Medicine

## 2018-12-04 DIAGNOSIS — F419 Anxiety disorder, unspecified: Secondary | ICD-10-CM

## 2018-12-04 NOTE — Telephone Encounter (Addendum)
Patient saw psychiatrist at [facility] today and they told her to stop Xanax, Remeron, Vistaril , Abilify and Zoloft and he put her on Trazodone and Celexa  Patient scheduled virtual visit to discuss with Dr Nicki Reaper Patient has follow up with Psych 12/19/18

## 2018-12-04 NOTE — Telephone Encounter (Signed)
Thank you :)

## 2018-12-04 NOTE — Progress Notes (Signed)
   Subjective:    Patient ID: [name], female    DOB: 1966/02/08, 53 y.o.   MRN: [id number]  HPI Patient saw psychiatrist at [facility] today and they told her to stop Xanax, Remeron, Vistaril , Abilify and Zoloft and he put her on Trazodone and Celexa  Patient scheduled virtual visit to discuss with Dr Nicki Reaper Patient has follow up with Psych 12/19/18 Had discussion with patient about her visit with psychiatry They are taking her off most of her medicines They recommend for her to stay on Xanax She states that the request last week for Xanax was a mistake she states it was really for her other medicine but I told her I understood and she states that she is tapering off of Xanax and will try to be without it she is going to do the best she can and following what the psychiatrist says she is working a new job and under a lot of stress but handling things fairly well Virtual Visit via Telephone Note  I connected with [name] on 12/04/18 at  3:50 PM EDT by telephone and verified that I am speaking with the correct person using two identifiers.  Location: Patient: home Provider: office   I discussed the limitations, risks, security and privacy concerns of performing an evaluation and management service by telephone and the availability of in person appointments. I also discussed with the patient that there may be a patient responsible charge related to this service. The patient expressed understanding and agreed to proceed.   History of Present Illness:    Observations/Objective:   Assessment and Plan:   Follow Up Instructions:    I discussed the assessment and treatment plan with the patient. The patient was provided an opportunity to ask questions and all were answered. The patient agreed with the plan and demonstrated an understanding of the instructions.   The patient was advised to call back or seek an in-person evaluation if the symptoms worsen or if the condition fails to  improve as anticipated.  I provided 15 minutes of non-face-to-face time during this encounter.        Review of Systems     Objective:   Physical Exam  Patient had virtual visit Appears to be in no distress Atraumatic Neuro able to relate and oriented No apparent resp distress Color normal       Assessment & Plan:  Significant anxiety with depression Not suicidal Tapering off of Xanax States she does not want any further Xanax from [place] She states she will follow-[facility] Should try to do what they say There is a possibility down the road if she has insurance she might seek care from a different psychiatry group.  For now she will stick with them she will follow-up here for any other regular health issues.  She denies abusing any medicines.

## 2018-12-19 ENCOUNTER — Other Ambulatory Visit: Payer: Self-pay | Admitting: Family Medicine

## 2018-12-19 ENCOUNTER — Telehealth: Payer: Self-pay | Admitting: Family Medicine

## 2018-12-19 DIAGNOSIS — F329 Major depressive disorder, single episode, unspecified: Secondary | ICD-10-CM

## 2018-12-19 DIAGNOSIS — F32A Depression, unspecified: Secondary | ICD-10-CM

## 2018-12-19 DIAGNOSIS — F419 Anxiety disorder, unspecified: Secondary | ICD-10-CM

## 2018-12-19 MED ORDER — ALBUTEROL SULFATE HFA 108 (90 BASE) MCG/ACT IN AERS: 2.0000 | INHALATION_SPRAY | Freq: Four times a day (QID) | RESPIRATORY_TRACT | 2 refills | Status: DC | PRN

## 2018-12-19 NOTE — Telephone Encounter (Signed)
Patient is requesting refill on inhaler, and anxiety medications and wanting a referral to mental health.She had a phone visit on 12/04/18 for anxiety issues and tried to give her a phone visit again and she states she has no phone but she wants you to return call on the number that in the system [phone] so offered the first available office visit which is 9/28. Please advise

## 2018-12-19 NOTE — Telephone Encounter (Signed)
Patient may have referral to behavioral health We will not be able to prescribe Xanax this will be up to whichever her mental health provider she sees May have a refill on albuterol inhaler with 2 additional refills

## 2018-12-19 NOTE — Telephone Encounter (Signed)
And all due respect When the patient has had significant mental health issues that necessitated being admitted into behavioral health hospital It is our policy to help set them up with a mental health specialist Because at that point they would benefit better from a mental health specialist It would not be good medical care for me to continue to prescribe medications in this situation It is for the benefit of the patient and the safety of the patient that we make these decisions I cannot prescribe nerve pills for this patient I can do a virtual visit with her in the future if she would like but I will not be able to prescribe nerve pills If she would like a referral to a different mental health specialist we can help get the ball in motion

## 2018-12-19 NOTE — Telephone Encounter (Signed)
Patient is very upset she can not get her anxiety medication. [facility] this am and thy will not give her anything and told her to count circles and she wants it prescribed by Dr Nicki Reaper who was prescribing in the past. Advised patient Dr Nicki Reaper sated he could not prescribe anxiety medication for the patient. Patient wants a visit to come in and talk to you directly to get her something for her anxiety. Patient does not want another referral to mental health at this time and stated she doesn't want to continue with [facility] because they are not helping her she wants to get her meds prescribed and handled here.

## 2018-12-20 ENCOUNTER — Other Ambulatory Visit: Payer: Self-pay | Admitting: *Deleted

## 2018-12-20 DIAGNOSIS — Z20822 Contact with and (suspected) exposure to covid-19: Secondary | ICD-10-CM

## 2018-12-20 NOTE — Telephone Encounter (Signed)
[facility] please

## 2018-12-20 NOTE — Telephone Encounter (Signed)
Referral put in.

## 2018-12-20 NOTE — Telephone Encounter (Signed)
Discussed with pt. Pt verbalized understanding that dr Nicki Reaper will not prescribe her xanax.  She does want referral and visit with dr scott to discuss her other meds. Transferred to front to schedule visit with dr Nicki Reaper. Dr Nicki Reaper is there anyone in particular you want to refer her.

## 2018-12-21 LAB — NOVEL CORONAVIRUS, NAA: SARS-CoV-2, NAA: NOT DETECTED

## 2018-12-25 ENCOUNTER — Other Ambulatory Visit: Payer: Self-pay

## 2018-12-25 ENCOUNTER — Ambulatory Visit (INDEPENDENT_AMBULATORY_CARE_PROVIDER_SITE_OTHER): Payer: Self-pay | Admitting: Family Medicine

## 2018-12-25 DIAGNOSIS — F322 Major depressive disorder, single episode, severe without psychotic features: Secondary | ICD-10-CM

## 2018-12-25 DIAGNOSIS — F411 Generalized anxiety disorder: Secondary | ICD-10-CM

## 2018-12-25 MED ORDER — ARIPIPRAZOLE 5 MG PO TABS: 5.0000 mg | ORAL_TABLET | Freq: Every day | ORAL | 0 refills | Status: DC

## 2018-12-25 MED ORDER — MIRTAZAPINE 7.5 MG PO TABS: 7.5000 mg | ORAL_TABLET | Freq: Every day | ORAL | 0 refills | Status: DC

## 2018-12-25 MED ORDER — SERTRALINE HCL 100 MG PO TABS: 100.0000 mg | ORAL_TABLET | Freq: Every day | ORAL | 0 refills | Status: DC

## 2018-12-25 NOTE — Progress Notes (Signed)
Subjective:    Patient ID: [name], female    DOB: [birth year], 53 y.o.   MRN: [id number]  HPIFollow up on anxiety and depression. Pt states son has called social services this morning to have someone come out and see pt. Pt wonders if she needs to go back to hospital.   Patient with severe depression and anxiety.  She states she feels very stressed but she states she does not want to hurt herself she states her son is helping her with finances she is unable to get a job or keep a job because of her underlying health issues.  Patient states that current medicine is not helping but the medicine she was on when she was in the behavioral health hospital helped more she would like to try those medicines.  She understands that she will not be able to get the Xanax.  She states she does not want to take Xanax  GAD 7 and PHQ9. Done.  GAD 7 : Generalized Anxiety Score 12/25/2018 02/12/2018  Nervous, Anxious, on Edge 3 1  Control/stop worrying 3 1  Worry too much - different things 3 1  Trouble relaxing 3 0  Restless 3 0  Easily annoyed or irritable - 1  Afraid - awful might happen - 0  Total GAD 7 Score - 4  Anxiety Difficulty Very difficult Not difficult at all      Office Visit [facility] 12/25/2018 in [facility]  PHQ-9 Total Score  24      Virtual Visit via Telephone Note  I connected with Susan DavenportPaige H Zehring on 12/25/18 at  9:30 AM EDT by telephone and verified that I am speaking with the correct person using two identifiers.  Location: Patient: home Provider: office   I discussed the limitations, risks, security and privacy concerns of performing an evaluation and management service by telephone and the availability of in person appointments. I also discussed with the patient that there may be a patient responsible charge related to this service. The patient expressed understanding and agreed to proceed.   History of Present Illness:    Observations/Objective:    Assessment and Plan:   Follow Up Instructions:    I discussed the assessment and treatment plan with the patient. The patient was provided an opportunity to ask questions and all were answered. The patient agreed with the plan and demonstrated an understanding of the instructions.   The patient was advised to call back or seek an in-person evaluation if the symptoms worsen or if the condition fails to improve as anticipated.  I provided 16 minutes of non-face-to-face time during this encounter.         Review of Systems  Constitutional: Negative for activity change and appetite change.  HENT: Negative for congestion and rhinorrhea.   Respiratory: Negative for cough and shortness of breath.   Cardiovascular: Negative for chest pain and leg swelling.  Gastrointestinal: Negative for abdominal pain, nausea and vomiting.  Skin: Negative for color change.  Neurological: Negative for dizziness and weakness.  Psychiatric/Behavioral: Positive for dysphoric mood. Negative for agitation and confusion.       Objective:   Physical Exam   Today's visit was via telephone Physical exam was not possible for this visit       Assessment & Plan:  Severe depression Change her medicines back to what she was on at the [facility] I told the patient if she feels like she is losing control she needs to  go back to the behavioral [facility] for further evaluation We do have a referral in place for [facility] but currently has not gotten appointment We will stay away from Xanax The patient states she is not suicidal We will do a follow-up phone visit with her in 1 week She was told that if she feels things are getting worse to notify us right away Stop trazodone and stop Celexa  Start Remeron 7.5 nightly, Abilify 5 mg daily, Zoloft 100 mg daily.

## 2019-01-01 ENCOUNTER — Encounter: Payer: Self-pay | Admitting: Family Medicine

## 2019-01-25 ENCOUNTER — Other Ambulatory Visit: Payer: Self-pay | Admitting: Family Medicine

## 2019-01-27 NOTE — Telephone Encounter (Signed)
May have 2 months on each with a follow-up visit by the end of the year

## 2019-01-28 ENCOUNTER — Other Ambulatory Visit: Payer: Self-pay | Admitting: Family Medicine

## 2019-02-14 ENCOUNTER — Ambulatory Visit (INDEPENDENT_AMBULATORY_CARE_PROVIDER_SITE_OTHER): Payer: Self-pay | Admitting: Family Medicine

## 2019-02-14 ENCOUNTER — Other Ambulatory Visit: Payer: Self-pay

## 2019-02-14 DIAGNOSIS — F411 Generalized anxiety disorder: Secondary | ICD-10-CM

## 2019-02-14 DIAGNOSIS — F410 Panic disorder [episodic paroxysmal anxiety] without agoraphobia: Secondary | ICD-10-CM

## 2019-02-14 MED ORDER — SERTRALINE HCL 100 MG PO TABS: 100.0000 mg | ORAL_TABLET | Freq: Every day | ORAL | 1 refills | Status: DC

## 2019-02-14 MED ORDER — ARIPIPRAZOLE 5 MG PO TABS: 5.0000 mg | ORAL_TABLET | Freq: Every day | ORAL | 1 refills | Status: DC

## 2019-02-14 MED ORDER — MIRTAZAPINE 15 MG PO TABS: 15.0000 mg | ORAL_TABLET | Freq: Every day | ORAL | 1 refills | Status: DC
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
## 2019-02-19 ENCOUNTER — Telehealth: Payer: Self-pay | Admitting: *Deleted

## 2019-02-19 ENCOUNTER — Other Ambulatory Visit: Payer: Self-pay | Admitting: *Deleted

## 2019-02-19 MED ORDER — SERTRALINE HCL 100 MG PO TABS: 100.0000 mg | ORAL_TABLET | Freq: Every day | ORAL | 1 refills | Status: DC

## 2019-02-19 MED ORDER — MIRTAZAPINE 15 MG PO TABS: 15.0000 mg | ORAL_TABLET | Freq: Every day | ORAL | 1 refills | Status: DC

## 2019-02-19 NOTE — Telephone Encounter (Signed)
Pt dropped of letter from [facility] and wanted to get her meds from them. I called the number on the form and talked with pharm. She said to e prescribe the meds but she wanted to go over list first because they have limited meds there. They do not have keppra or abilify. They did have zoloft and remeron. Dr Nicki Reaper had sent these in on 11/5 so I resent them to [facility] and called and told pt that she would only be able to get those two meds due to med assist not having the other two available. She verbalized understanding. I made a copy of the letter and sent it to be scanned in her chart and the original for pt to pickup since it has the number on it that she calls for refills. States she will pick up tomorrow.

## 2019-02-22 ENCOUNTER — Telehealth: Payer: Self-pay | Admitting: Family Medicine

## 2019-02-22 DIAGNOSIS — F32A Depression, unspecified: Secondary | ICD-10-CM

## 2019-02-22 DIAGNOSIS — F419 Anxiety disorder, unspecified: Secondary | ICD-10-CM

## 2019-02-22 DIAGNOSIS — F329 Major depressive disorder, single episode, unspecified: Secondary | ICD-10-CM

## 2019-02-22 NOTE — Telephone Encounter (Signed)
Referral put in and left message to return call to notify pt.  

## 2019-02-22 NOTE — Telephone Encounter (Signed)
Patient fount a doctor she would like to go to and would like a referral to [place] with [facility] for psychiatry.

## 2019-02-22 NOTE — Telephone Encounter (Signed)
Please go ahead and give this referral as requested thank you

## 2019-02-25 ENCOUNTER — Encounter: Payer: Self-pay | Admitting: Family Medicine

## 2019-02-25 ENCOUNTER — Encounter: Payer: Self-pay | Admitting: Psychology

## 2019-02-25 NOTE — Telephone Encounter (Signed)
Patient notified

## 2019-04-17 ENCOUNTER — Telehealth: Payer: Self-pay | Admitting: Family Medicine

## 2019-04-17 MED ORDER — MIRTAZAPINE 15 MG PO TABS: 15.0000 mg | ORAL_TABLET | Freq: Every day | ORAL | 0 refills | Status: DC

## 2019-04-17 MED ORDER — SERTRALINE HCL 100 MG PO TABS: 100.0000 mg | ORAL_TABLET | Freq: Every day | ORAL | 0 refills | Status: DC

## 2019-04-17 NOTE — Telephone Encounter (Signed)
Medications sent to pharmacy with note stating pt needs office visit by end of January

## 2019-04-17 NOTE — Telephone Encounter (Signed)
May have 1 refill each but needs to do a follow-up visit virtual end of January

## 2019-04-17 NOTE — Telephone Encounter (Signed)
Fax from [facility] requesting refill on Mirtazapine 15 mg tablet take one tablet po once every night at bedtime and Sertraline 100 mg tablet take one tablet po once every day. Please advise. Thank you

## 2019-04-17 NOTE — Addendum Note (Signed)
Addended by: Marlowe Shores on: 04/17/2019 11:58 AM   Modules accepted: Orders

## 2019-05-03 ENCOUNTER — Other Ambulatory Visit: Payer: Self-pay

## 2019-05-03 ENCOUNTER — Encounter: Payer: Self-pay | Admitting: Psychology

## 2019-05-03 ENCOUNTER — Encounter: Payer: Self-pay | Attending: Psychology | Admitting: Psychology

## 2019-05-03 DIAGNOSIS — F411 Generalized anxiety disorder: Secondary | ICD-10-CM | POA: Insufficient documentation

## 2019-05-03 DIAGNOSIS — F410 Panic disorder [episodic paroxysmal anxiety] without agoraphobia: Secondary | ICD-10-CM | POA: Insufficient documentation

## 2019-05-03 DIAGNOSIS — F332 Major depressive disorder, recurrent severe without psychotic features: Secondary | ICD-10-CM | POA: Insufficient documentation

## 2019-05-03 DIAGNOSIS — F4001 Agoraphobia with panic disorder: Secondary | ICD-10-CM | POA: Insufficient documentation
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
## 2019-05-09 ENCOUNTER — Ambulatory Visit: Payer: Medicaid Other | Attending: Family

## 2019-05-09 ENCOUNTER — Other Ambulatory Visit: Payer: Self-pay

## 2019-05-09 DIAGNOSIS — Z20822 Contact with and (suspected) exposure to covid-19: Secondary | ICD-10-CM

## 2019-05-10 LAB — NOVEL CORONAVIRUS, NAA: SARS-CoV-2, NAA: NOT DETECTED

## 2019-05-15 ENCOUNTER — Telehealth: Payer: Self-pay | Admitting: Family Medicine

## 2019-05-15 NOTE — Telephone Encounter (Signed)
Called and discussed with pt and she verbalized understanding.  

## 2019-05-15 NOTE — Telephone Encounter (Signed)
FYI-patient wanted you to know she has stopped taking Zoloft 100 mg and currently seeing Dr. Clarise Cruz.

## 2019-05-15 NOTE — Telephone Encounter (Signed)
Glad she is seeing Dr. Claudine Mouton Please keep regular follow-up with [place] If Dr. Zoila Shutter recommends medication of any sort or additional consultation with psychiatry we would not be opposed but would like to be kept up today about what is going on thank you

## 2019-05-17 ENCOUNTER — Encounter: Payer: Self-pay | Attending: Psychology | Admitting: Psychology

## 2019-05-17 ENCOUNTER — Other Ambulatory Visit: Payer: Self-pay

## 2019-05-17 DIAGNOSIS — F4001 Agoraphobia with panic disorder: Secondary | ICD-10-CM | POA: Insufficient documentation

## 2019-05-17 DIAGNOSIS — F410 Panic disorder [episodic paroxysmal anxiety] without agoraphobia: Secondary | ICD-10-CM | POA: Insufficient documentation

## 2019-05-17 DIAGNOSIS — F411 Generalized anxiety disorder: Secondary | ICD-10-CM | POA: Insufficient documentation

## 2019-05-17 DIAGNOSIS — F332 Major depressive disorder, recurrent severe without psychotic features: Secondary | ICD-10-CM | POA: Insufficient documentation

## 2019-05-17 DIAGNOSIS — R569 Unspecified convulsions: Secondary | ICD-10-CM

## 2019-06-03 ENCOUNTER — Telehealth: Payer: Self-pay | Admitting: Family Medicine

## 2019-06-03 NOTE — Telephone Encounter (Signed)
Left message to schedule appointment

## 2019-06-03 NOTE — Telephone Encounter (Signed)
Please schedule follow up and then route back to nurses to send in refill.

## 2019-06-03 NOTE — Telephone Encounter (Signed)
Patient is requesting refill on Remeron 15 mg last seen 11/5/2020and last filled 04/17/2019. [facility] [employer] [facility]

## 2019-06-03 NOTE — Telephone Encounter (Signed)
May have 1 refill needs to schedule follow-up visit within the next 30 days thank you

## 2019-06-03 NOTE — Telephone Encounter (Signed)
Note sent with last refill that she needed to be seen by end of jan

## 2019-06-04 ENCOUNTER — Other Ambulatory Visit: Payer: Self-pay | Admitting: *Deleted

## 2019-06-04 MED ORDER — MIRTAZAPINE 15 MG PO TABS: 15.0000 mg | ORAL_TABLET | Freq: Every day | ORAL | 0 refills | Status: DC

## 2019-06-04 NOTE — Telephone Encounter (Signed)
Refill sent to pharm

## 2019-06-04 NOTE — Telephone Encounter (Signed)
Patient has phone visit on 2/23 for medication follow up

## 2019-06-05 ENCOUNTER — Other Ambulatory Visit: Payer: Self-pay

## 2019-06-05 ENCOUNTER — Ambulatory Visit (INDEPENDENT_AMBULATORY_CARE_PROVIDER_SITE_OTHER): Payer: Medicaid Other | Admitting: Family Medicine

## 2019-06-05 DIAGNOSIS — R569 Unspecified convulsions: Secondary | ICD-10-CM

## 2019-06-05 DIAGNOSIS — R232 Flushing: Secondary | ICD-10-CM

## 2019-06-05 DIAGNOSIS — G4700 Insomnia, unspecified: Secondary | ICD-10-CM

## 2019-06-05 DIAGNOSIS — F338 Other recurrent depressive disorders: Secondary | ICD-10-CM

## 2019-06-05 MED ORDER — LEVETIRACETAM 750 MG PO TABS: 750.0000 mg | ORAL_TABLET | Freq: Two times a day (BID) | ORAL | 2 refills | Status: DC

## 2019-06-05 MED ORDER — MIRTAZAPINE 15 MG PO TABS: 15.0000 mg | ORAL_TABLET | Freq: Every day | ORAL | 2 refills | Status: DC

## 2019-06-05 NOTE — Progress Notes (Signed)
   Subjective:    Patient ID: [name], female    DOB: 10-24-1965, 54 y.o.   MRN: [id number]  HPI Pt is needing refills on medications. Pt was using [facility] but that takes a while for patient to receive. Pt is now using [facility]. Pt is no longer taking Ambilify or Zoloft.  Pt is wanting to know should she see a GYN for her intense hot flashes.  Virtual Visit via Telephone Note  I connected with [name] on 06/05/19 at  2:00 PM EST by telephone and verified that I am speaking with the correct person using two identifiers.  Location: Patient: home Provider: office   I discussed the limitations, risks, security and privacy concerns of performing an evaluation and management service by telephone and the availability of in person appointments. I also discussed with the patient that there may be a patient responsible charge related to this service. The patient expressed understanding and agreed to proceed.   History of Present Illness:    Observations/Objective:   Assessment and Plan:   Follow Up Instructions:    I discussed the assessment and treatment plan with the patient. The patient was provided an opportunity to ask questions and all were answered. The patient agreed with the plan and demonstrated an understanding of the instructions.   The patient was advised to call back or seek an in-person evaluation if the symptoms worsen or if the condition fails to improve as anticipated.  I provided 18 minutes of non-face-to-face time during this encounter.       Review of Systems     Objective:   Physical Exam  Today's visit was via telephone Physical exam was not possible for this visit       Assessment & Plan:  1. Seasonal affective disorder [facility]) Patient states she is starting to feel better now that the weather is turning better she is currently only using the Remeron at nighttime not utilizing any of the other medicine she is doing  counseling plus also she states that she will be seeing a psychiatrist that her counselor is setting her up with  2. Seizure (HCC) Has a history of seizures taken medication no seizures in the past 6 months  3. Insomnia, unspecified type I sympathize with her insomnia but I would not add any other medicines Remeron seems to be helping to some degree  4. Hot flashes Given her hot flashes referral for further evaluation by gynecology may or may not need hormonal therapy but patient is a smoker - Ambulatory referral to Gynecology  Patient be noted that I did communicate with her psychologist Dr. Penni Bombard, he is working on helping set her up with Dr. Tenny Craw he did request via patient's input as well Bu[facility]par for anxiety we will send this in the patient will do regular follow-up with [place] but will also start seeing psychiatry who will manage her Remeron

## 2019-06-06 ENCOUNTER — Other Ambulatory Visit: Payer: Self-pay

## 2019-06-06 ENCOUNTER — Encounter: Payer: Self-pay | Admitting: Psychology

## 2019-06-06 ENCOUNTER — Encounter (HOSPITAL_BASED_OUTPATIENT_CLINIC_OR_DEPARTMENT_OTHER): Payer: Self-pay | Admitting: Psychology

## 2019-06-06 DIAGNOSIS — F410 Panic disorder [episodic paroxysmal anxiety] without agoraphobia: Secondary | ICD-10-CM

## 2019-06-06 DIAGNOSIS — F332 Major depressive disorder, recurrent severe without psychotic features: Secondary | ICD-10-CM

## 2019-06-06 DIAGNOSIS — F4001 Agoraphobia with panic disorder: Secondary | ICD-10-CM

## 2019-06-06 DIAGNOSIS — F411 Generalized anxiety disorder: Secondary | ICD-10-CM

## 2019-06-06 MED ORDER — BUSPIRONE HCL 10 MG PO TABS: 10.0000 mg | ORAL_TABLET | Freq: Three times a day (TID) | ORAL | 2 refills | Status: DC
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
## 2019-06-07 ENCOUNTER — Encounter: Payer: Self-pay | Admitting: Family Medicine

## 2019-06-07 ENCOUNTER — Encounter: Payer: Self-pay | Admitting: Psychology
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
## 2019-06-13 ENCOUNTER — Ambulatory Visit: Payer: Self-pay | Admitting: Psychology

## 2019-06-17 ENCOUNTER — Encounter: Payer: Self-pay | Admitting: Family Medicine

## 2019-06-18 ENCOUNTER — Ambulatory Visit: Payer: Medicaid Other | Admitting: Psychology

## 2019-06-18 ENCOUNTER — Other Ambulatory Visit: Payer: Self-pay

## 2019-06-18 ENCOUNTER — Encounter: Payer: Self-pay | Attending: Psychology | Admitting: Psychology

## 2019-06-18 DIAGNOSIS — F410 Panic disorder [episodic paroxysmal anxiety] without agoraphobia: Secondary | ICD-10-CM

## 2019-06-18 DIAGNOSIS — F4001 Agoraphobia with panic disorder: Secondary | ICD-10-CM

## 2019-06-18 DIAGNOSIS — R569 Unspecified convulsions: Secondary | ICD-10-CM

## 2019-06-18 DIAGNOSIS — F411 Generalized anxiety disorder: Secondary | ICD-10-CM

## 2019-06-18 DIAGNOSIS — F332 Major depressive disorder, recurrent severe without psychotic features: Secondary | ICD-10-CM

## 2019-06-19 ENCOUNTER — Ambulatory Visit (HOSPITAL_COMMUNITY): Payer: Federal, State, Local not specified - Other | Admitting: Psychiatry

## 2019-06-19 ENCOUNTER — Ambulatory Visit (INDEPENDENT_AMBULATORY_CARE_PROVIDER_SITE_OTHER): Payer: Federal, State, Local not specified - Other | Admitting: Psychiatry

## 2019-06-19 ENCOUNTER — Encounter (HOSPITAL_COMMUNITY): Payer: Self-pay | Admitting: Psychiatry

## 2019-06-19 DIAGNOSIS — F332 Major depressive disorder, recurrent severe without psychotic features: Secondary | ICD-10-CM

## 2019-06-19 DIAGNOSIS — F411 Generalized anxiety disorder: Secondary | ICD-10-CM

## 2019-06-19 DIAGNOSIS — F410 Panic disorder [episodic paroxysmal anxiety] without agoraphobia: Secondary | ICD-10-CM

## 2019-06-19 MED ORDER — BUSPIRONE HCL 10 MG PO TABS: 10.0000 mg | ORAL_TABLET | Freq: Three times a day (TID) | ORAL | 2 refills | Status: DC

## 2019-06-19 MED ORDER — MIRTAZAPINE 15 MG PO TABS: 15.0000 mg | ORAL_TABLET | Freq: Every day | ORAL | 2 refills | Status: DC
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
## 2019-06-27 ENCOUNTER — Encounter: Payer: Self-pay | Admitting: Adult Health

## 2019-06-27 ENCOUNTER — Other Ambulatory Visit: Payer: Self-pay

## 2019-06-27 ENCOUNTER — Ambulatory Visit (INDEPENDENT_AMBULATORY_CARE_PROVIDER_SITE_OTHER): Payer: Self-pay | Admitting: Adult Health

## 2019-06-27 VITALS — BP 102/71 | HR 63 | Ht 67.0 in | Wt 128.0 lb

## 2019-06-27 DIAGNOSIS — R232 Flushing: Secondary | ICD-10-CM

## 2019-06-27 DIAGNOSIS — R4589 Other symptoms and signs involving emotional state: Secondary | ICD-10-CM | POA: Insufficient documentation

## 2019-06-27 DIAGNOSIS — R61 Generalized hyperhidrosis: Secondary | ICD-10-CM | POA: Insufficient documentation

## 2019-06-27 DIAGNOSIS — Z7189 Other specified counseling: Secondary | ICD-10-CM

## 2019-06-27 MED ORDER — ESTRADIOL 1 MG PO TABS: 1.0000 mg | ORAL_TABLET | Freq: Every day | ORAL | 6 refills | Status: DC

## 2019-06-27 NOTE — Progress Notes (Signed)
  Subjective:     Patient ID: [name], female   DOB: 06-Jan-1966, 54 y.o.   MRN: [id number]  HPI [name] is a 54 year old white female,divorced, sp hysterectomy in to discuss hot flashes,night sweats and moody at times. PCP is Dr Lilyan Punt.   Review of Systems +hot flashes for 5-6 years +night seats +moody Has sex occasionally  Reviewed past medical,surgical, social and family history. Reviewed medications and allergies.     Objective:   Physical Exam BP 102/71 (BP Location: Left Arm, Patient Position: Sitting, Cuff Size: Normal)   Pulse 63   Ht 5\' 7"  (1.702 m)   Wt 128 lb (58.1 kg)   BMI 20.05 kg/m     Skin warm and dry. Neck: mid line trachea, normal thyroid, good ROM, no lymphadenopathy noted. Lungs: clear to ausculation bilaterally. Cardiovascular: regular rate and rhythm. Fall risk is low PHQ 2 score is 1.  Assessment:     1. Hot flashes Will try Estrace 1 mg daily  Meds ordered this encounter  Medications  . estradiol (ESTRACE) 1 MG tablet    Sig: Take 1 tablet (1 mg total) by mouth daily.    Dispense:  30 tablet    Refill:  6    Order Specific Question:   Supervising Provider    Answer:   , LUTHER H [[id number]]    2. Night sweats Will try estrace  3. Moody Will try estrace  4. Counseling for estrogen replacement therapy Discussed estrogen replacement and she wants to try,she denies stroke, MI, DVT or breast cancer  Given number for [facility] to get mammogram     Plan:     Follow up in 3 months or sooner if needed

## 2019-06-29 ENCOUNTER — Encounter: Payer: Self-pay | Admitting: Psychology
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
## 2019-07-18 ENCOUNTER — Ambulatory Visit: Payer: Medicaid Other | Admitting: Psychology

## 2019-08-06 ENCOUNTER — Other Ambulatory Visit: Payer: Self-pay

## 2019-08-06 ENCOUNTER — Telehealth (INDEPENDENT_AMBULATORY_CARE_PROVIDER_SITE_OTHER): Payer: Self-pay | Admitting: Psychiatry

## 2019-08-06 ENCOUNTER — Encounter (HOSPITAL_COMMUNITY): Payer: Self-pay | Admitting: Psychiatry

## 2019-08-06 DIAGNOSIS — F332 Major depressive disorder, recurrent severe without psychotic features: Secondary | ICD-10-CM

## 2019-08-06 DIAGNOSIS — F410 Panic disorder [episodic paroxysmal anxiety] without agoraphobia: Secondary | ICD-10-CM

## 2019-08-06 DIAGNOSIS — F411 Generalized anxiety disorder: Secondary | ICD-10-CM

## 2019-08-06 MED ORDER — BUSPIRONE HCL 10 MG PO TABS: 10.0000 mg | ORAL_TABLET | Freq: Three times a day (TID) | ORAL | 2 refills | Status: DC

## 2019-08-06 MED ORDER — MIRTAZAPINE 15 MG PO TABS: 15.0000 mg | ORAL_TABLET | Freq: Every day | ORAL | 2 refills | Status: DC
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
## 2019-08-26 ENCOUNTER — Ambulatory Visit: Payer: Medicaid Other | Admitting: Psychology

## 2019-09-02 ENCOUNTER — Other Ambulatory Visit: Payer: Self-pay

## 2019-09-02 ENCOUNTER — Ambulatory Visit (INDEPENDENT_AMBULATORY_CARE_PROVIDER_SITE_OTHER): Payer: Self-pay | Admitting: Family Medicine

## 2019-09-02 ENCOUNTER — Encounter: Payer: Self-pay | Admitting: Family Medicine

## 2019-09-02 VITALS — BP 132/76 | Temp 95.0°F | Ht 67.0 in | Wt 126.6 lb

## 2019-09-02 DIAGNOSIS — Z1211 Encounter for screening for malignant neoplasm of colon: Secondary | ICD-10-CM

## 2019-09-02 DIAGNOSIS — E785 Hyperlipidemia, unspecified: Secondary | ICD-10-CM

## 2019-09-02 DIAGNOSIS — F410 Panic disorder [episodic paroxysmal anxiety] without agoraphobia: Secondary | ICD-10-CM

## 2019-09-02 DIAGNOSIS — F411 Generalized anxiety disorder: Secondary | ICD-10-CM

## 2019-09-02 NOTE — Progress Notes (Signed)
   Subjective:    Patient ID: [name], female    DOB: [birth year], 54 y.o.   MRN: [id number]  HPI Pt here today for follow up. Pt states no issues or concerns. Doing well on all medications. PHQ-complete This patient does have chronic depression anxiety and panic attack she is seeing psychiatry they have her on medication she relates her current symptoms are under good control but she is always fearful of them coming back she feels because of this she is disabled but she will continue to work because she needs to do so she denies any major health issues she does have a history of elevated cholesterol she does try to be careful what she eats She defers on colonoscopy she will do stool test for blood and will consider colonoscopy if she gets insurance PHQ9 SCORE ONLY 09/02/2019 06/27/2019 12/25/2018  PHQ-9 Total Score 4 1 24     Review of Systems  Constitutional: Negative for activity change, fatigue and fever.  HENT: Negative for congestion and rhinorrhea.   Respiratory: Negative for cough, chest tightness and shortness of breath.   Cardiovascular: Negative for chest pain and leg swelling.  Gastrointestinal: Negative for abdominal pain and nausea.  Skin: Negative for color change.  Neurological: Negative for dizziness and headaches.  Psychiatric/Behavioral: Negative for agitation and behavioral problems.       Objective:   Physical Exam Vitals reviewed.  Constitutional:      General: She is not in acute distress. HENT:     Head: Normocephalic and atraumatic.  Eyes:     General:        Right eye: No discharge.        Left eye: No discharge.  Neck:     Trachea: No tracheal deviation.  Cardiovascular:     Rate and Rhythm: Normal rate and regular rhythm.     Heart sounds: Normal heart sounds. No murmur.  Pulmonary:     Effort: Pulmonary effort is normal. No respiratory distress.     Breath sounds: Normal breath sounds.  Lymphadenopathy:     Cervical: No cervical adenopathy.    Skin:    General: Skin is warm and dry.  Neurological:     Mental Status: She is alert.     Coordination: Coordination normal.  Psychiatric:        Behavior: Behavior normal.           Assessment & Plan:  The patient states she is applying for disability because of her chronic depression anxiety and panic attacks It is certainly true through the years she has had significant amount of depression anxiety and panic attacks currently doing somewhat better see discussion above  Hyperlipidemia check lab work watch diet stay active  Stool test for blood patient defers on colonoscopy due to lack of insurance  Follow-up 6 months

## 2019-09-02 NOTE — Patient Instructions (Signed)
Steps to Quit Smoking Smoking tobacco is the leading cause of preventable death. It can affect almost every organ in the body. Smoking puts you and people around you at risk for many serious, long-lasting (chronic) diseases. Quitting smoking can be hard, but it is one of the best things that you can do for your health. It is never too late to quit. How do I get ready to quit? When you decide to quit smoking, make a plan to help you succeed. Before you quit:  Pick a date to quit. Set a date within the next 2 weeks to give you time to prepare.  Write down the reasons why you are quitting. Keep this list in places where you will see it often. Tell your family, friends, and co-workers that you are quitting. Their support is important.Nicotine skin patches What is this medicine? NICOTINE (NIK oh teen) helps people stop smoking. The patches replace the nicotine found in cigarettes and help to decrease withdrawal effects. They are most effective when used in combination with a stop-smoking program. This medicine may be used for other purposes; ask your health care provider or pharmacist if you have questions. COMMON BRAND NAME(S): Habitrol, Nicoderm CQ, Nicotrol What should I tell my health care provider before I take this medicine? They need to know if you have any of these conditions: diabetes heart disease, angina, irregular heartbeat or previous heart attack high blood pressure lung disease, including asthma overactive thyroid pheochromocytoma seizures or a history of seizures skin problems, like eczema stomach problems or ulcers an unusual or allergic reaction to nicotine, adhesives, other medicines, foods, dyes, or preservatives pregnant or trying to get pregnant breast-feeding How should I use this medicine? This medicine is for use on the skin. Follow the directions that come with the patches. Find an area of skin on your upper arm, chest, or back that is clean, dry, greaseless,  undamaged and hairless. Wash hands with plain soap and water. Do not use anything that contains aloe, lanolin or glycerin as these may prevent the patch from sticking. Dry thoroughly. Remove the patch from the sealed pouch. Do not try to cut or trim the patch. Using your palm, press the patch firmly in place for 10 seconds to make sure that there is good contact with your skin. After applying the patch, wash your hands. Change the patch every day, keeping to a regular schedule. When you apply a new patch, use a new area of skin. Wait at least 1 week before using the same area again. Talk to your pediatrician regarding the use of this medicine in children. Special care may be needed. Overdosage: If you think you have taken too much of this medicine contact a poison control center or emergency room at once. NOTE: This medicine is only for you. Do not share this medicine with others. What if I miss a dose? If you forget to replace a patch, use it as soon as you can. Only use one patch at a time and do not leave on the skin for longer than directed. If a patch falls off, you can replace it, but keep to your schedule and remove the patch at the right time. What may interact with this medicine? medicines for asthma medicines for blood pressure medicines for mental depression This list may not describe all possible interactions. Give your health care provider a list of all the medicines, herbs, non-prescription drugs, or dietary supplements you use. Also tell them if you smoke, drink alcohol,  or use illegal drugs. Some items may interact with your medicine. What should I watch for while using this medicine? You should begin using the nicotine patch the day you stop smoking. It is okay if you do not succeed at your attempt to quit and have a cigarette. You can still continue your quit attempt and keep using the product as directed. Just throw away your cigarettes and get back to your quit plan. You can keep the  patch in place during swimming, bathing, and showering. If your patch falls off during these activities, replace it. When you first apply the patch, your skin may itch or burn. This should go away soon. When you remove a patch, the skin may look red, but this should only last for a few days. Call your doctor or health care professional if skin redness does not go away after 4 days, if your skin swells, or if you get a rash. If you are a diabetic and you quit smoking, the effects of insulin may be increased and you may need to reduce your insulin dose. Check with your doctor or health care professional about how you should adjust your insulin dose. If you are going to have a magnetic resonance imaging (MRI) procedure, tell your MRI technician if you have this patch on your body. It must be removed before a MRI. What side effects may I notice from receiving this medicine? Side effects that you should report to your doctor or health care professional as soon as possible: allergic reactions like skin rash, itching or hives, swelling of the face, lips, or tongue breathing problems changes in hearing changes in vision chest pain cold sweats confusion fast, irregular heartbeat feeling faint or lightheaded, falls headache increased saliva skin redness that lasts more than 4 days stomach pain signs and symptoms of nicotine overdose like nausea; vomiting; dizziness; weakness; and rapid heartbeat Side effects that usually do not require medical attention (report to your doctor or health care professional if they continue or are bothersome): diarrhea dry mouth hiccups irritability nervousness or restlessness trouble sleeping or vivid dreams This list may not describe all possible side effects. Call your doctor for medical advice about side effects. You may report side effects to FDA at 1-800-FDA-1088. Where should I keep my medicine? Keep out of the reach of children. Store at room temperature  between 20 and 25 degrees C (68 and 77 degrees F). Protect from heat and light. Store in Tax inspector until ready to use. Throw away unused medicine after the expiration date. When you remove a patch, fold with sticky sides together; put in an empty opened pouch and throw away. NOTE: This sheet is a summary. It may not cover all possible information. If you have questions about this medicine, talk to your doctor, pharmacist, or health care provider.  2020 Elsevier/Gold Standard (2014-02-24 15:46:21)    Talk with your doctor about the choices that may help you quit.  Find out if your health insurance will pay for these treatments.  Know the people, places, things, and activities that make you want to smoke (triggers). Avoid them. What first steps can I take to quit smoking?  Throw away all cigarettes at home, at work, and in your car.  Throw away the things that you use when you smoke, such as ashtrays and lighters.  Clean your car. Make sure to empty the ashtray.  Clean your home, including curtains and carpets. What can I do to help me quit smoking? Talk  with your doctor about taking medicines and seeing a counselor at the same time. You are more likely to succeed when you do both.  If you are pregnant or breastfeeding, talk with your doctor about counseling or other ways to quit smoking. Do not take medicine to help you quit smoking unless your doctor tells you to do so. To quit smoking: Quit right away  Quit smoking totally, instead of slowly cutting back on how much you smoke over a period of time.  Go to counseling. You are more likely to quit if you go to counseling sessions regularly. Take medicine You may take medicines to help you quit. Some medicines need a prescription, and some you can buy over-the-counter. Some medicines may contain a drug called nicotine to replace the nicotine in cigarettes. Medicines may:  Help you to stop having the desire to smoke  (cravings).  Help to stop the problems that come when you stop smoking (withdrawal symptoms). Your doctor may ask you to use:  Nicotine patches, gum, or lozenges.  Nicotine inhalers or sprays.  Non-nicotine medicine that is taken by mouth. Find resources Find resources and other ways to help you quit smoking and remain smoke-free after you quit. These resources are most helpful when you use them often. They include:  Online chats with a Social worker.  Phone quitlines.  Printed Furniture conservator/restorer.  Support groups or group counseling.  Text messaging programs.  Mobile phone apps. Use apps on your mobile phone or tablet that can help you stick to your quit plan. There are many free apps for mobile phones and tablets as well as websites. Examples include Quit Guide from the [employer] and smokefree.gov  What things can I do to make it easier to quit?   Talk to your family and friends. Ask them to support and encourage you.  Call a phone quitline (1-800-QUIT-NOW), reach out to support groups, or work with a Social worker.  Ask people who smoke to not smoke around you.  Avoid places that make you want to smoke, such as: ? Bars. ? Parties. ? Smoke-break areas at work.  Spend time with people who do not smoke.  Lower the stress in your life. Stress can make you want to smoke. Try these things to help your stress: ? Getting regular exercise. ? Doing deep-breathing exercises. ? Doing yoga. ? Meditating. ? Doing a body scan. To do this, close your eyes, focus on one area of your body at a time from head to toe. Notice which parts of your body are tense. Try to relax the muscles in those areas. How will I feel when I quit smoking? Day 1 to 3 weeks Within the first 24 hours, you may start to have some problems that come from quitting tobacco. These problems are very bad 2-3 days after you quit, but they do not often last for more than 2-3 weeks. You may get these symptoms:  Mood  swings.  Feeling restless, nervous, angry, or annoyed.  Trouble concentrating.  Dizziness.  Strong desire for high-sugar foods and nicotine.  Weight gain.  Trouble pooping (constipation).  Feeling like you may vomit (nausea).  Coughing or a sore throat.  Changes in how the medicines that you take for other issues work in your body.  Depression.  Trouble sleeping (insomnia). Week 3 and afterward After the first 2-3 weeks of quitting, you may start to notice more positive results, such as:  Better sense of smell and taste.  Less coughing and sore  throat.  Slower heart rate.  Lower blood pressure.  Clearer skin.  Better breathing.  Fewer sick days. Quitting smoking can be hard. Do not give up if you fail the first time. Some people need to try a few times before they succeed. Do your best to stick to your quit plan, and talk with your doctor if you have any questions or concerns. Summary  Smoking tobacco is the leading cause of preventable death. Quitting smoking can be hard, but it is one of the best things that you can do for your health.  When you decide to quit smoking, make a plan to help you succeed.  Quit smoking right away, not slowly over a period of time.  When you start quitting, seek help from your doctor, family, or friends. This information is not intended to replace advice given to you by your health care provider. Make sure you discuss any questions you have with your health care provider. Document Revised: 12/21/2018 Document Reviewed: 06/16/2018 Elsevier Patient Education  2020 [employer].

## 2019-09-08 ENCOUNTER — Encounter: Payer: Self-pay | Admitting: Emergency Medicine

## 2019-09-08 ENCOUNTER — Other Ambulatory Visit: Payer: Self-pay

## 2019-09-08 ENCOUNTER — Ambulatory Visit
Admission: EM | Admit: 2019-09-08 | Discharge: 2019-09-08 | Disposition: A | Discharge status: Home / Self Care | Payer: Medicaid Other | Attending: Emergency Medicine | Admitting: Emergency Medicine

## 2019-09-08 DIAGNOSIS — Z20822 Contact with and (suspected) exposure to covid-19: Secondary | ICD-10-CM

## 2019-09-08 DIAGNOSIS — J069 Acute upper respiratory infection, unspecified: Secondary | ICD-10-CM

## 2019-09-08 LAB — POCT RAPID STREP A (OFFICE): Rapid Strep A Screen: NEGATIVE

## 2019-09-08 MED ORDER — CETIRIZINE HCL 10 MG PO TABS: 10.0000 mg | ORAL_TABLET | Freq: Every day | ORAL | 0 refills | Status: DC

## 2019-09-08 MED ORDER — FLUTICASONE PROPIONATE 50 MCG/ACT NA SUSP: 2.0000 | Freq: Every day | NASAL | 0 refills | Status: DC

## 2019-09-08 NOTE — ED Triage Notes (Signed)
Sore throat that started yesterday and bothered her all night.  Since she woke up this morning her throat has been scratchy and nasal congestion.  Pt also reports increased wax from her ears when she cleans them.

## 2019-09-08 NOTE — ED Provider Notes (Signed)
[facility]   [id number] 09/08/19 Arrival Time: 1039   CC: COVID symptoms  SUBJECTIVE: History from: patient.  [name] is a 54 y.o. female who presents with sore throat, slight cough, and nasal congestion x 1 day.  Denies sick exposure to COVID, flu or strep.  Has tried throat lozenges with minimal relief.  Symptoms are made worse with swallowing, but tolerating own secretions and liquids.  Reports previous symptoms in the past with strep.   Denies fever, chills, fatigue, SOB, wheezing, chest pain, nausea, changes in bowel or bladder habits.    Got covid vaccines  ROS: As per HPI.  All other pertinent ROS negative.     Past Medical History:  Diagnosis Date  . Anxiety   . Chiari malformation   . COPD (chronic obstructive pulmonary disease) (HCC)   . Depression   . 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 IBS (irritable bowel syndrome)   . Seizures (HCC) 10/23/14   x 2  . Shingles 11/12/14   left eye   Past Surgical History:  Procedure Laterality Date  . ABDOMINAL HYSTERECTOMY     partial 2010-Dr.Ferguson  . I & D EXTREMITY Left 07/17/2014   Procedure: IRRIGATION AND DEBRIDEMENT left index finger;  Surgeon: Betha Loa, MD;  Location: [facility] OR;  Service: Orthopedics;  Laterality: Left;  . NERVE, TENDON AND ARTERY REPAIR Left 07/17/2014   Procedure: NERVE, TENDON AND ARTERY REPAIR LEFT INDEX FINGER;  Surgeon: Betha Loa, MD;  Location: [facility] OR;  Service: Orthopedics;  Laterality: Left;  . PARTIAL HYSTERECTOMY  2006  . TUBAL LIGATION  1988   Allergies  Allergen Reactions  . Cephalexin   . Effexor [Venlafaxine Hcl] Other (See Comments)    Dizziness, pain behind eye  . Ampicillin Rash  . Neomycin Rash  . Penicillins Rash    Did it involve swelling of the face/tongue/throat, SOB, or low BP? Unknown Did it involve sudden or severe rash/hives, skin peeling, or any reaction on the inside of your mouth or nose? Unknown Did you need to seek medical attention at a hospital  or doctor's office? Unknown When did it last happen? If all above answers are "NO", may proceed with cephalosporin use.    No current facility-administered medications on file prior to encounter.   Current Outpatient Medications on File Prior to Encounter  Medication Sig Dispense Refill  . albuterol (VENTOLIN HFA) 108 (90 Base) MCG/ACT inhaler Inhale 2 puffs into the lungs every 6 (six) hours as needed for wheezing or shortness of breath. 16 g 2  . levETIRAcetam (KEPPRA) 750 MG tablet Take 1 tablet (750 mg total) by mouth 2 (two) times daily. 60 tablet 2  . mirtazapine (REMERON) 15 MG tablet Take 1 tablet (15 mg total) by mouth at bedtime. 30 tablet 2  . Multiple Vitamin (MULTIVITAMIN) tablet Take 1 tablet by mouth daily.     Social History   Socioeconomic History  . Marital status: Divorced    Spouse name: [name]  . Number of children: 1  . Years of education: 58  . Highest education level: Not on file  Occupational History  . Occupation: home, yard maintenance    Comment: self employed  Tobacco Use  . Smoking status: Current Every Day Smoker    Packs/day: 0.50    Years: 30.00    Pack years: 15.00    Types: Cigarettes  . Smokeless tobacco: Never Used  Substance and Sexual Activity  . Alcohol use: Yes  Comment: occasionally  . Drug use: Not Currently    Types: Marijuana  . Sexual activity: Yes    Birth control/protection: Surgical    Comment: tubal and hyst  Other Topics Concern  . Not on file  Social History Narrative   Lives at home with husband   Caffeine use- coffee, sodas all day   Social Determinants of Health   Financial Resource Strain:   . Difficulty of Paying Living Expenses:   Food Insecurity:   . Worried About Programme researcher, broadcasting/film/video in the Last Year:   . Barista in the Last Year:   Transportation Needs:   . Freight forwarder (Medical):   Marland Kitchen Lack of Transportation (Non-Medical):   Physical Activity:   . Days of Exercise per Week:     . Minutes of Exercise per Session:   Stress:   . Feeling of Stress :   Social Connections:   . Frequency of Communication with Friends and Family:   . Frequency of Social Gatherings with Friends and Family:   . Attends Religious Services:   . Active Member of Clubs or Organizations:   . Attends Banker Meetings:   Marland Kitchen Marital Status:   Intimate Partner Violence:   . Fear of Current or Ex-Partner:   . Emotionally Abused:   Marland Kitchen Physically Abused:   . Sexually Abused:    Family History  Problem Relation Age of Onset  . Hypertension Sister   . Depression Sister   . Drug abuse Sister   . Hypertension Mother   . Osteoporosis Mother   . Diabetes Mother   . Alcohol abuse Mother   . Depression Mother   . Heart disease Father        MI  . Depression Father   . Depression Sister   . Multiple sclerosis Sister   . Heart attack Sister   . Cancer - Lung Maternal Aunt   . Leukemia Maternal Aunt   . Colon cancer Neg Hx     OBJECTIVE:  Vitals:   09/08/19 1047  BP: 110/68  Pulse: 69  Resp: 17  Temp: 98.2 F (36.8 C)  TempSrc: Oral  SpO2: 97%  Weight: 126 lb (57.2 kg)  Height: 5\' 7"  (1.702 m)     General appearance: alert; appears mild fatigued, but nontoxic; speaking in full sentences and tolerating own secretions HEENT: NCAT; Ears: EACs clear, TMs pearly gray; Eyes: PERRL.  EOM grossly intact. Nose: nares patent without rhinorrhea, Throat: oropharynx clear, tonsils non erythematous or enlarged, uvula midline  Neck: supple without LAD Lungs: unlabored respirations, symmetrical air entry; cough: absent; no respiratory distress; CTAB Heart: regular rate and rhythm.  Skin: warm and dry Psychological: alert and cooperative; normal mood and affect  LABS:  Results for orders placed or performed during the hospital encounter of 09/08/19 (from the past 24 hour(s))  POCT rapid strep A     Status: None   Collection Time: 09/08/19 10:59 AM  Result Value Ref Range    Rapid Strep A Screen Negative Negative     ASSESSMENT & PLAN:  1. Viral URI   2. Suspected COVID-19 virus infection     Meds ordered this encounter  Medications  . cetirizine (ZYRTEC) 10 MG tablet    Sig: Take 1 tablet (10 mg total) by mouth daily.    Dispense:  30 tablet    Refill:  0    Order Specific Question:   Supervising Provider    Answer:  Blanchie Serve SUE [[id number]]  . fluticasone (FLONASE) 50 MCG/ACT nasal spray    Sig: Place 2 sprays into both nostrils daily.    Dispense:  16 g    Refill:  0    Order Specific Question:   Supervising Provider    Answer:   Raylene Everts [[id number]]    Strep negative.  Culture sent.  We will call you if you strep culture is positive Declines COVID test at this time  In the meantime: You should remain isolated in your home for 10 days from symptom onset AND greater than 72 hours after symptoms resolution (absence of fever without the use of fever-reducing medication and improvement in respiratory symptoms), whichever is longer Get plenty of rest and push fluids zyrtec for nasal congestion, runny nose, and/or sore throat flonase for nasal congestion and runny nose Use medications daily for symptom relief Use OTC medications like ibuprofen or tylenol as needed fever or pain Follow up with PCP this week for recheck Call or go to the ED if you have any new or worsening symptoms such as fever, cough, shortness of breath, chest tightness, chest pain, turning blue, changes in mental status, etc...   Reviewed expectations re: course of current medical issues. Questions answered. Outlined signs and symptoms indicating need for more acute intervention. Patient verbalized understanding. After Visit Summary given.         Lestine Box, PA-C 09/08/19 1131

## 2019-09-08 NOTE — Discharge Instructions (Signed)
Strep negative.  Culture sent.  We will call you if you strep culture is positive Declines COVID test at this time  In the meantime: You should remain isolated in your home for 10 days from symptom onset AND greater than 72 hours after symptoms resolution (absence of fever without the use of fever-reducing medication and improvement in respiratory symptoms), whichever is longer Get plenty of rest and push fluids zyrtec for nasal congestion, runny nose, and/or sore throat flonase for nasal congestion and runny nose Use medications daily for symptom relief Use OTC medications like ibuprofen or tylenol as needed fever or pain Follow up with PCP this week for recheck Call or go to the ED if you have any new or worsening symptoms such as fever, cough, shortness of breath, chest tightness, chest pain, turning blue, changes in mental status, etc..Marland Kitchen

## 2019-09-10 ENCOUNTER — Ambulatory Visit: Payer: Medicaid Other | Attending: Internal Medicine

## 2019-09-10 ENCOUNTER — Telehealth: Payer: Self-pay | Admitting: Family Medicine

## 2019-09-10 ENCOUNTER — Other Ambulatory Visit: Payer: Self-pay | Admitting: Family Medicine

## 2019-09-10 ENCOUNTER — Other Ambulatory Visit: Payer: Self-pay

## 2019-09-10 ENCOUNTER — Other Ambulatory Visit: Payer: Self-pay | Admitting: *Deleted

## 2019-09-10 DIAGNOSIS — K921 Melena: Secondary | ICD-10-CM

## 2019-09-10 DIAGNOSIS — Z20822 Contact with and (suspected) exposure to covid-19: Secondary | ICD-10-CM

## 2019-09-10 LAB — IFOBT (OCCULT BLOOD): IFOBT: NEGATIVE

## 2019-09-10 NOTE — Telephone Encounter (Signed)
Lm to return call, Ifobt was negative.

## 2019-09-10 NOTE — Telephone Encounter (Signed)
Pt dropped off sample left at nurses station

## 2019-09-10 NOTE — Telephone Encounter (Signed)
This +4 refills 

## 2019-09-11 LAB — SARS-COV-2, NAA 2 DAY TAT

## 2019-09-11 LAB — NOVEL CORONAVIRUS, NAA: SARS-CoV-2, NAA: NOT DETECTED

## 2019-09-12 LAB — CULTURE, GROUP A STREP (THRC)

## 2019-09-27 ENCOUNTER — Ambulatory Visit: Payer: Medicaid Other | Admitting: Adult Health

## 2019-11-05 ENCOUNTER — Encounter (HOSPITAL_COMMUNITY): Payer: Self-pay | Admitting: Psychiatry

## 2019-11-05 ENCOUNTER — Telehealth (INDEPENDENT_AMBULATORY_CARE_PROVIDER_SITE_OTHER): Payer: Medicaid Other | Admitting: Psychiatry

## 2019-11-05 ENCOUNTER — Other Ambulatory Visit: Payer: Self-pay

## 2019-11-05 DIAGNOSIS — F410 Panic disorder [episodic paroxysmal anxiety] without agoraphobia: Secondary | ICD-10-CM

## 2019-11-05 DIAGNOSIS — F411 Generalized anxiety disorder: Secondary | ICD-10-CM

## 2019-11-05 DIAGNOSIS — F332 Major depressive disorder, recurrent severe without psychotic features: Secondary | ICD-10-CM

## 2019-11-05 MED ORDER — MIRTAZAPINE 15 MG PO TABS: 15.0000 mg | ORAL_TABLET | Freq: Every day | ORAL | 2 refills | Status: DC
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
## 2019-11-27 DIAGNOSIS — Z029 Encounter for administrative examinations, unspecified: Secondary | ICD-10-CM

## 2019-12-06 ENCOUNTER — Encounter (HOSPITAL_COMMUNITY): Payer: Self-pay | Admitting: Psychiatry

## 2019-12-06 ENCOUNTER — Telehealth: Payer: Self-pay | Admitting: Family Medicine

## 2019-12-06 ENCOUNTER — Telehealth (HOSPITAL_COMMUNITY): Payer: Self-pay | Admitting: *Deleted

## 2019-12-06 NOTE — Telephone Encounter (Signed)
Sent through Mychart

## 2019-12-06 NOTE — Telephone Encounter (Signed)
LMOM to inform patient with this information

## 2019-12-06 NOTE — Telephone Encounter (Signed)
Patient stated she just received a call that she was terminated from her job and has contacted a disability lawyer to file for disability. Phone number and address of mental health urgent care given to patient

## 2019-12-06 NOTE — Telephone Encounter (Signed)
Patient called in to make an appt.  Patient was upset and crying.  Said she was under a lot of stress and couldn't work. Hard to understand patient due to crying. No nurse available for triage.  I did talk with patient and made sure she was in no immediate danger.  She said she was ok to wait for the nurse to call her back and get more info so we can figure out how to schedule her.
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
## 2019-12-06 NOTE — Telephone Encounter (Signed)
Pt called tearful stating that she had an anxiety attack yesterday(last night) at work. Per pt she had to leave an now she is needing a note for work. Per pt she's having one now. Per pt she would like to know if provider could release the note to her mychart. [phone].

## 2019-12-06 NOTE — Telephone Encounter (Signed)
Left message to return call 

## 2019-12-06 NOTE — Telephone Encounter (Signed)
She may have a work excuse for today I would recommend giving her the contact information for the behavioral health urgent care center in [place] that is run by [facility] They do 24-hour a day visits Clydie Braun knows the contact information) She can try to get in with Dr. Tenny Craw earlier Or we can see her for a visit next week Unfortunately unable to see today

## 2019-12-06 NOTE — Telephone Encounter (Signed)
If letter is not able to be released to patient mychart, patient have the options to either pick up letter or staff can mail letter to patient.

## 2019-12-06 NOTE — Telephone Encounter (Signed)
Noted thank you

## 2019-12-06 NOTE — Telephone Encounter (Signed)
I'm fine with it, but Ive never done this through Mychart.

## 2019-12-09 ENCOUNTER — Telehealth (INDEPENDENT_AMBULATORY_CARE_PROVIDER_SITE_OTHER): Payer: Self-pay | Admitting: Psychiatry

## 2019-12-09 ENCOUNTER — Other Ambulatory Visit: Payer: Self-pay

## 2019-12-09 ENCOUNTER — Telehealth (HOSPITAL_COMMUNITY): Payer: Self-pay | Admitting: Psychiatry

## 2019-12-09 ENCOUNTER — Encounter (HOSPITAL_COMMUNITY): Payer: Self-pay | Admitting: Psychiatry

## 2019-12-09 DIAGNOSIS — F332 Major depressive disorder, recurrent severe without psychotic features: Secondary | ICD-10-CM

## 2019-12-09 DIAGNOSIS — F411 Generalized anxiety disorder: Secondary | ICD-10-CM

## 2019-12-09 DIAGNOSIS — F410 Panic disorder [episodic paroxysmal anxiety] without agoraphobia: Secondary | ICD-10-CM

## 2019-12-09 MED ORDER — MIRTAZAPINE 30 MG PO TABS: 30.0000 mg | ORAL_TABLET | Freq: Every day | ORAL | 2 refills | Status: DC

## 2019-12-09 MED ORDER — ARIPIPRAZOLE 2 MG PO TABS: 2.0000 mg | ORAL_TABLET | Freq: Every day | ORAL | 2 refills | Status: DC

## 2019-12-09 NOTE — Telephone Encounter (Signed)
Called patient to schedule follow up appt. Left voice message to return call and schedule
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
## 2019-12-13 ENCOUNTER — Telehealth (HOSPITAL_COMMUNITY): Payer: Self-pay | Admitting: *Deleted

## 2019-12-13 NOTE — Telephone Encounter (Signed)
Tell her to stop abilify. I cannot provide notes until I return to the office next Tuesday. Please schedule her for appt Tuesday to discuss

## 2019-12-13 NOTE — Telephone Encounter (Signed)
Patient called stating she had a reaction to the Abilify. Per pt she was light headedness, dizziness, she did not pas out or anything, hot, and things just got in slow motion. Per pt she just took it Tuesday and this happened and did not take anymore. Pt stated provider increased her antidepressants and it seems to be doing okay. Pt number is [phone]. Per pt she would like for provider to write her a note to go back to work part time.

## 2019-12-13 NOTE — Telephone Encounter (Signed)
Spoke with patient and informed her with what provider stated.   Per pt she can not afford to sch another appt and its costing her. Per pt she discussed this with provider already during her last visit.

## 2019-12-17 ENCOUNTER — Telehealth (HOSPITAL_COMMUNITY): Payer: Self-pay | Admitting: *Deleted

## 2019-12-17 NOTE — Telephone Encounter (Signed)
Opened in Error.

## 2019-12-17 NOTE — Telephone Encounter (Signed)
I left message asking how many hours per week she can work

## 2019-12-17 NOTE — Telephone Encounter (Signed)
LMOM

## 2019-12-18 NOTE — Telephone Encounter (Signed)
Ok, thanks.

## 2019-12-18 NOTE — Telephone Encounter (Signed)
Spoke with patient and she stated that she will not be needing a letter right now but when she does she will call office to get the letter. Per pt she works at [employer] and she wont know what hours she works until she gets placed for a part time position. But as of right now she do not have a set time to be out and will call office back for that letter if her job she gets placed at needs one.

## 2019-12-30 ENCOUNTER — Telehealth (HOSPITAL_COMMUNITY): Payer: Self-pay | Admitting: Psychiatry

## 2020-01-09 ENCOUNTER — Ambulatory Visit (INDEPENDENT_AMBULATORY_CARE_PROVIDER_SITE_OTHER): Payer: Self-pay | Admitting: Family Medicine

## 2020-01-09 ENCOUNTER — Encounter: Payer: Self-pay | Admitting: Family Medicine

## 2020-01-09 ENCOUNTER — Other Ambulatory Visit: Payer: Self-pay

## 2020-01-09 ENCOUNTER — Encounter: Payer: Self-pay | Attending: Psychology | Admitting: Psychology

## 2020-01-09 VITALS — Temp 98.3°F | Wt 118.6 lb

## 2020-01-09 DIAGNOSIS — R569 Unspecified convulsions: Secondary | ICD-10-CM | POA: Insufficient documentation

## 2020-01-09 DIAGNOSIS — F411 Generalized anxiety disorder: Secondary | ICD-10-CM | POA: Insufficient documentation

## 2020-01-09 DIAGNOSIS — R270 Ataxia, unspecified: Secondary | ICD-10-CM

## 2020-01-09 DIAGNOSIS — F4001 Agoraphobia with panic disorder: Secondary | ICD-10-CM | POA: Insufficient documentation

## 2020-01-09 DIAGNOSIS — F410 Panic disorder [episodic paroxysmal anxiety] without agoraphobia: Secondary | ICD-10-CM | POA: Insufficient documentation

## 2020-01-09 DIAGNOSIS — G935 Compression of brain: Secondary | ICD-10-CM

## 2020-01-09 DIAGNOSIS — F332 Major depressive disorder, recurrent severe without psychotic features: Secondary | ICD-10-CM | POA: Insufficient documentation

## 2020-01-09 DIAGNOSIS — R296 Repeated falls: Secondary | ICD-10-CM

## 2020-01-09 NOTE — Progress Notes (Signed)
   Subjective:    Patient ID: [name], female    DOB: 1965-06-04, 54 y.o.   MRN: [id number]  HPI Patient reports getting dizzy and light headed and losing control of her legs on and off for the last year. Patient has suffered from 4 falls this year with no serious injuries but has hit her head several times.  Notices this occurs mostly at night if she gets out of bed for something.  Patient states she has had multiple times over the past month where she got up during the night felt off balance and had several episodes for which she fell over several times she hit her head other times she just hit her body she also states she feels like she may have dented her skull. She denies LOC no double vision no vomiting. She does relate posterior headaches. She denies numbness or tingling down the arms or the legs.  She is followed by psychiatry for mental health issues and is on medication with them she had some questions regarding this medicine that I encourage her to speak with her psychiatrist  She also sees her counselor on a regular basis  Review of Systems  Constitutional: Negative for activity change and appetite change.  HENT: Negative for congestion and rhinorrhea.   Respiratory: Negative for cough and shortness of breath.   Cardiovascular: Negative for chest pain and leg swelling.  Gastrointestinal: Negative for abdominal pain, nausea and vomiting.  Skin: Negative for color change.  Neurological: Positive for dizziness and light-headedness. Negative for weakness.  Psychiatric/Behavioral: Negative for agitation and confusion.       Objective:   Physical Exam Vitals reviewed.  Constitutional:      General: She is not in acute distress. HENT:     Head: Normocephalic.  Cardiovascular:     Rate and Rhythm: Normal rate and regular rhythm.     Heart sounds: Normal heart sounds. No murmur heard.   Pulmonary:     Effort: Pulmonary effort is normal.     Breath sounds: Normal breath  sounds.  Lymphadenopathy:     Cervical: No cervical adenopathy.  Neurological:     Mental Status: She is alert.  Psychiatric:        Behavior: Behavior normal.   Finger-to-nose normal. Romberg negative. Subjective discomfort in the posterior aspect of the head. There is some slight indention in the skull in the front part but I believe that this is most likely suture lines and not a trauma related issue but the patient states she never recalls having this        Assessment & Plan:  Patient has history of Chiari malformation I would recommend MRI to look to see if this is progressed This could be contributing to some of the headaches she has been having as well as the ataxia It is possible her medications could be causing this but I would expect that to avoid because symptoms throughout the 24 hours not just at nighttime Orthostatics are negative  The patient request that MRI of the brain not be until November because she does not have any insurance and she hopes to have insurance by that point in time she will notify us if she is able to get the MRI sooner Patient to follow-up if worse

## 2020-01-10 ENCOUNTER — Encounter: Payer: Self-pay | Admitting: Psychology
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
## 2020-01-10 NOTE — Addendum Note (Signed)
Addended by: Margaretha Sheffield on: 01/10/2020 09:35 AM   Modules accepted: Orders

## 2020-01-12 ENCOUNTER — Encounter: Payer: Self-pay | Admitting: Family Medicine

## 2020-01-13 NOTE — Telephone Encounter (Signed)
I read note and do you want MRI brain with or without contrast and we will get scheduled.

## 2020-01-14 ENCOUNTER — Encounter: Payer: Self-pay | Admitting: Family Medicine

## 2020-01-15 NOTE — Telephone Encounter (Signed)
MRI is scheduled for 01/22/2020 & pt was notified by a MyChart message

## 2020-01-22 ENCOUNTER — Ambulatory Visit (HOSPITAL_COMMUNITY)
Admission: RE | Admit: 2020-01-22 | Discharge: 2020-01-22 | Disposition: A | Discharge status: Home / Self Care | Payer: Self-pay | Source: Ambulatory Visit | Attending: Family Medicine | Admitting: Family Medicine

## 2020-01-22 ENCOUNTER — Other Ambulatory Visit: Payer: Self-pay

## 2020-01-22 DIAGNOSIS — R270 Ataxia, unspecified: Secondary | ICD-10-CM | POA: Insufficient documentation

## 2020-01-22 DIAGNOSIS — G935 Compression of brain: Secondary | ICD-10-CM | POA: Insufficient documentation

## 2020-01-22 DIAGNOSIS — R296 Repeated falls: Secondary | ICD-10-CM | POA: Insufficient documentation

## 2020-01-23 ENCOUNTER — Other Ambulatory Visit: Payer: Self-pay | Admitting: Family Medicine

## 2020-01-23 DIAGNOSIS — R296 Repeated falls: Secondary | ICD-10-CM

## 2020-01-23 DIAGNOSIS — R270 Ataxia, unspecified: Secondary | ICD-10-CM

## 2020-01-23 DIAGNOSIS — G935 Compression of brain: Secondary | ICD-10-CM

## 2020-02-05 ENCOUNTER — Telehealth (HOSPITAL_COMMUNITY): Payer: Medicaid Other | Admitting: Psychiatry

## 2020-02-18 ENCOUNTER — Telehealth (INDEPENDENT_AMBULATORY_CARE_PROVIDER_SITE_OTHER): Payer: Self-pay | Admitting: Psychiatry

## 2020-02-18 ENCOUNTER — Encounter (HOSPITAL_COMMUNITY): Payer: Self-pay | Admitting: Psychiatry

## 2020-02-18 ENCOUNTER — Other Ambulatory Visit: Payer: Self-pay

## 2020-02-18 DIAGNOSIS — F411 Generalized anxiety disorder: Secondary | ICD-10-CM

## 2020-02-18 DIAGNOSIS — F332 Major depressive disorder, recurrent severe without psychotic features: Secondary | ICD-10-CM

## 2020-02-18 DIAGNOSIS — F410 Panic disorder [episodic paroxysmal anxiety] without agoraphobia: Secondary | ICD-10-CM

## 2020-02-18 MED ORDER — MIRTAZAPINE 30 MG PO TABS
30.0000 mg | ORAL_TABLET | Freq: Every day | ORAL | 2 refills | Status: DC
Start: 2020-02-18 — End: 2020-05-26
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
## 2020-04-22 ENCOUNTER — Other Ambulatory Visit: Payer: Self-pay | Admitting: Family Medicine

## 2020-04-23 ENCOUNTER — Telehealth: Payer: Medicaid Other | Admitting: Psychology

## 2020-05-19 ENCOUNTER — Ambulatory Visit: Payer: Medicaid Other | Admitting: Diagnostic Neuroimaging

## 2020-05-26 ENCOUNTER — Encounter (HOSPITAL_COMMUNITY): Payer: Self-pay | Admitting: Psychiatry

## 2020-05-26 ENCOUNTER — Other Ambulatory Visit: Payer: Self-pay

## 2020-05-26 ENCOUNTER — Telehealth (INDEPENDENT_AMBULATORY_CARE_PROVIDER_SITE_OTHER): Payer: Self-pay | Admitting: Psychiatry

## 2020-05-26 DIAGNOSIS — F332 Major depressive disorder, recurrent severe without psychotic features: Secondary | ICD-10-CM

## 2020-05-26 MED ORDER — MIRTAZAPINE 30 MG PO TABS
30.0000 mg | ORAL_TABLET | Freq: Every day | ORAL | 2 refills | Status: DC
Start: 2020-05-26 — End: 2020-08-19
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
## 2020-06-09 ENCOUNTER — Ambulatory Visit (INDEPENDENT_AMBULATORY_CARE_PROVIDER_SITE_OTHER): Payer: Self-pay | Admitting: Family Medicine

## 2020-06-09 ENCOUNTER — Encounter: Payer: Self-pay | Admitting: Family Medicine

## 2020-06-09 ENCOUNTER — Other Ambulatory Visit: Payer: Self-pay

## 2020-06-09 VITALS — BP 122/82 | HR 92 | Temp 97.9°F | Ht 67.0 in | Wt 120.2 lb

## 2020-06-09 DIAGNOSIS — R569 Unspecified convulsions: Secondary | ICD-10-CM

## 2020-06-09 DIAGNOSIS — H9202 Otalgia, left ear: Secondary | ICD-10-CM

## 2020-06-09 DIAGNOSIS — H608X2 Other otitis externa, left ear: Secondary | ICD-10-CM

## 2020-06-09 MED ORDER — LEVETIRACETAM 750 MG PO TABS: 750.0000 mg | ORAL_TABLET | Freq: Two times a day (BID) | ORAL | 0 refills | Status: DC

## 2020-06-09 MED ORDER — HYDROCORTISONE-ACETIC ACID 1-2 % OT SOLN: 4.0000 [drp] | Freq: Two times a day (BID) | OTIC | 0 refills | Status: DC

## 2020-06-09 NOTE — Progress Notes (Signed)
Patient ID: [name], female    DOB: 03-03-1966, 55 y.o.   MRN: [id number]   Chief Complaint  Patient presents with  . left ear sore    Has to wear ear plugs at work and left eat is sore when wearing ear plugs for 3 weeks   Subjective:  CC: left ear pain Patient would like a refill of Keppra This is a new problem.  Presents today for left ear pain.  Reports that she wears earplugs at work.  Symptoms have been present for 3 weeks.  Describes as throbbing, and feeling "raw" reports there is no change in her hearing, was recently tested at work with a hearing test.  Denies fever, chills, chest pain, shortness of breath.  Has a history of seizure disorder, request refill on her Keppra today, she will see Dr. Lilyan Pacheco for her medication management for seizure disorder within the next couple of weeks.    Medical History [name] has a past medical history of Anxiety, Chiari malformation, COPD (chronic obstructive pulmonary disease) (HCC), Depression, High risk HPV infection (November 2005), IBS (irritable bowel syndrome), Seizures (HCC) (10/23/14), and Shingles (11/12/14).   Outpatient Encounter Medications as of 06/09/2020  Medication Sig  . acetic acid-hydrocortisone (VOSOL-HC) OTIC solution Place 4 drops into the left ear 2 (two) times daily.  Marland Kitchen albuterol (VENTOLIN HFA) 108 (90 Base) MCG/ACT inhaler Inhale 2 puffs into the lungs every 6 (six) hours as needed for wheezing or shortness of breath.  . levETIRAcetam (KEPPRA) 750 MG tablet Take 1 tablet (750 mg total) by mouth 2 (two) times daily.  . mirtazapine (REMERON) 30 MG tablet Take 1 tablet (30 mg total) by mouth at bedtime.  . Multiple Vitamin (MULTIVITAMIN) tablet Take 1 tablet by mouth daily.  . [DISCONTINUED] levETIRAcetam (KEPPRA) 750 MG tablet TAKE 1 TABLET BY MOUTH 2 TIMES DAILY.   No facility-administered encounter medications on file as of 06/09/2020.     Review of Systems  Constitutional: Negative for chills and fever.   HENT: Positive for ear pain. Negative for ear discharge, sinus pressure, sinus pain, sore throat and tinnitus.   Respiratory: Negative for cough and shortness of breath.   Cardiovascular: Negative for chest pain.  Gastrointestinal: Negative for abdominal pain.  Neurological: Negative for headaches.     Vitals BP 122/82   Pulse 92   Temp 97.9 F (36.6 C) (Oral)   Ht 5\' 7"  (1.702 m)   Wt 120 lb 3.2 oz (54.5 kg)   SpO2 97%   BMI 18.83 kg/m   Objective:   Physical Exam Vitals reviewed.  Constitutional:      Appearance: Normal appearance.  HENT:     Right Ear: Tympanic membrane normal.     Left Ear: No decreased hearing noted. No drainage or tenderness. No mastoid tenderness.     Ears:     Comments: Reports feeling like there is a cyst inside the canal- wears ear plugs at work.     Nose: Nose normal.     Mouth/Throat:     Mouth: Mucous membranes are moist.     Pharynx: Oropharynx is clear.  Cardiovascular:     Rate and Rhythm: Normal rate and regular rhythm.     Heart sounds: Normal heart sounds.  Pulmonary:     Effort: Pulmonary effort is normal.     Breath sounds: Normal breath sounds.  Skin:    General: Skin is warm and dry.  Neurological:     General: No focal  deficit present.     Mental Status: She is alert.  Psychiatric:        Behavior: Behavior normal.      Assessment and Plan   1. Left ear pain - acetic acid-hydrocortisone (VOSOL-HC) OTIC solution; Place 4 drops into the left ear 2 (two) times daily.  Dispense: 10 mL; Refill: 0  2. Noninfectious otitis externa of left ear, unspecified chronicity, unspecified type - acetic acid-hydrocortisone (VOSOL-HC) OTIC solution; Place 4 drops into the left ear 2 (two) times daily.  Dispense: 10 mL; Refill: 0  3. Seizure (HCC) - levETIRAcetam (KEPPRA) 750 MG tablet; Take 1 tablet (750 mg total) by mouth 2 (two) times daily.  Dispense: 28 tablet; Refill: 0   Will treat left ear symptoms with eardrops, otitis  externa.  Likely due to earplug use at work.  TMs normal today.  Hearing normal per patient.  Will refill Keppra for 2 weeks, she is instructed to schedule with Dr. Lilyan Pacheco for medication management at the end of this visit today.   Agrees with plan of care discussed today. Understands warning signs to seek further care: chest pain, shortness of breath, any significant change in health.  Understands to follow-up in 2 weeks with Dr [name], and sooner if ear issues are not improved.    Susan Bodo, NP 06/09/20

## 2020-06-09 NOTE — Patient Instructions (Signed)

## 2020-06-10 ENCOUNTER — Other Ambulatory Visit: Payer: Self-pay | Admitting: Family Medicine

## 2020-06-10 ENCOUNTER — Telehealth: Payer: Self-pay

## 2020-06-10 DIAGNOSIS — H60502 Unspecified acute noninfective otitis externa, left ear: Secondary | ICD-10-CM | POA: Insufficient documentation

## 2020-06-10 MED ORDER — CIPRO HC 0.2-1 % OT SUSP: 3.0000 [drp] | Freq: Two times a day (BID) | OTIC | 0 refills | Status: DC

## 2020-06-10 NOTE — Progress Notes (Signed)
Otitis externa

## 2020-06-10 NOTE — Telephone Encounter (Signed)
Pharmacy does not have this Med manufacture don't make no moreacetic acid-hydrocortisone (VOSOL-HC) OTIC solution    and need another type of med.   Call back [phone]

## 2020-06-10 NOTE — Telephone Encounter (Signed)
Medication changed and sent to [facility]

## 2020-06-10 NOTE — Telephone Encounter (Signed)
Error

## 2020-06-10 NOTE — Telephone Encounter (Signed)
Patient notified- [facility] stated they can compound the drops as ordered.

## 2020-06-23 ENCOUNTER — Other Ambulatory Visit: Payer: Self-pay

## 2020-06-23 ENCOUNTER — Ambulatory Visit (INDEPENDENT_AMBULATORY_CARE_PROVIDER_SITE_OTHER): Payer: Self-pay | Admitting: Family Medicine

## 2020-06-23 ENCOUNTER — Encounter: Payer: Self-pay | Admitting: Family Medicine

## 2020-06-23 DIAGNOSIS — R569 Unspecified convulsions: Secondary | ICD-10-CM

## 2020-06-23 MED ORDER — LEVETIRACETAM 750 MG PO TABS: 750.0000 mg | ORAL_TABLET | Freq: Two times a day (BID) | ORAL | 5 refills | Status: DC

## 2020-06-23 NOTE — Progress Notes (Signed)
   Subjective:    Patient ID: [name], female    DOB: Aug 02, 1965, 55 y.o.   MRN: [id number]  HPI Pt here for follow up on Keppra 750 mg. Pt was placed on Keppra BID. Pt states she is doing ok on medication.  Patient has not had any seizures recently.  She states a while back she was off her medicine briefly and did have a seizure but this was over a year ago.  She denies any major setbacks she is currently working but unfortunately does not have insurance states she cannot afford colonoscopy or labs currently  Review of Systems  Constitutional: Negative for activity change and appetite change.  HENT: Negative for congestion and rhinorrhea.   Respiratory: Negative for cough and shortness of breath.   Cardiovascular: Negative for chest pain and leg swelling.  Gastrointestinal: Negative for abdominal pain, nausea and vomiting.  Skin: Negative for color change.  Neurological: Negative for dizziness and weakness.  Psychiatric/Behavioral: Negative for agitation and confusion.       Objective:   Physical Exam Vitals reviewed.  Constitutional:      Appearance: She is well-developed.  HENT:     Head: Normocephalic.  Cardiovascular:     Rate and Rhythm: Normal rate and regular rhythm.     Heart sounds: Normal heart sounds. No murmur heard.   Pulmonary:     Effort: Pulmonary effort is normal.     Breath sounds: Normal breath sounds.  Skin:    General: Skin is warm and dry.  Neurological:     Mental Status: She is alert.           Assessment & Plan:  Patient with seizure condition No longer able to afford insurance Currently cannot afford to get lab work done She will do lab work later this year Defers on colonoscopy currently Doing stool test for blood and stat up-to-date on that

## 2020-07-16 ENCOUNTER — Telehealth: Payer: Medicaid Other | Admitting: Psychology

## 2020-08-19 ENCOUNTER — Other Ambulatory Visit (HOSPITAL_COMMUNITY): Payer: Self-pay | Admitting: Psychiatry

## 2020-08-19 NOTE — Telephone Encounter (Signed)
Call for appt

## 2020-08-24 ENCOUNTER — Telehealth (INDEPENDENT_AMBULATORY_CARE_PROVIDER_SITE_OTHER): Payer: Self-pay | Admitting: Psychiatry

## 2020-08-24 ENCOUNTER — Other Ambulatory Visit: Payer: Self-pay

## 2020-08-24 ENCOUNTER — Encounter (HOSPITAL_COMMUNITY): Payer: Self-pay | Admitting: Psychiatry

## 2020-08-24 DIAGNOSIS — F410 Panic disorder [episodic paroxysmal anxiety] without agoraphobia: Secondary | ICD-10-CM

## 2020-08-24 DIAGNOSIS — F411 Generalized anxiety disorder: Secondary | ICD-10-CM

## 2020-08-24 DIAGNOSIS — F332 Major depressive disorder, recurrent severe without psychotic features: Secondary | ICD-10-CM

## 2020-08-24 MED ORDER — MIRTAZAPINE 30 MG PO TABS
30.0000 mg | ORAL_TABLET | Freq: Every day | ORAL | 0 refills | Status: DC
Start: 2020-08-24 — End: 2020-10-22
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
## 2020-10-20 ENCOUNTER — Other Ambulatory Visit (HOSPITAL_COMMUNITY): Payer: Self-pay | Admitting: Psychiatry

## 2020-10-22 ENCOUNTER — Telehealth (HOSPITAL_COMMUNITY): Payer: Self-pay | Admitting: Psychiatry

## 2020-10-22 ENCOUNTER — Other Ambulatory Visit: Payer: Self-pay | Admitting: Psychiatry

## 2020-10-22 MED ORDER — MIRTAZAPINE 30 MG PO TABS
30.0000 mg | ORAL_TABLET | Freq: Every day | ORAL | 0 refills | Status: DC
Start: 2020-10-22 — End: 2020-11-16

## 2020-10-22 NOTE — Telephone Encounter (Signed)
Ordered

## 2020-10-22 NOTE — Telephone Encounter (Signed)
Patient called requesting refill on Mirtazapine 30 mg. Same pharmacy as used in the past.

## 2020-11-16 ENCOUNTER — Encounter (HOSPITAL_COMMUNITY): Payer: Self-pay | Admitting: Psychiatry

## 2020-11-16 ENCOUNTER — Telehealth (INDEPENDENT_AMBULATORY_CARE_PROVIDER_SITE_OTHER): Payer: Self-pay | Admitting: Psychiatry

## 2020-11-16 ENCOUNTER — Other Ambulatory Visit: Payer: Self-pay

## 2020-11-16 DIAGNOSIS — F410 Panic disorder [episodic paroxysmal anxiety] without agoraphobia: Secondary | ICD-10-CM

## 2020-11-16 DIAGNOSIS — F332 Major depressive disorder, recurrent severe without psychotic features: Secondary | ICD-10-CM

## 2020-11-16 DIAGNOSIS — F411 Generalized anxiety disorder: Secondary | ICD-10-CM

## 2020-11-16 MED ORDER — MIRTAZAPINE 30 MG PO TABS: 30.0000 mg | ORAL_TABLET | Freq: Every day | ORAL | 2 refills | Status: DC
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
## 2020-11-23 ENCOUNTER — Other Ambulatory Visit: Payer: Self-pay

## 2020-11-23 ENCOUNTER — Ambulatory Visit (INDEPENDENT_AMBULATORY_CARE_PROVIDER_SITE_OTHER): Payer: Self-pay | Admitting: Family Medicine

## 2020-11-23 VITALS — BP 128/86 | Ht 67.0 in | Wt 116.6 lb

## 2020-11-23 DIAGNOSIS — Z79899 Other long term (current) drug therapy: Secondary | ICD-10-CM

## 2020-11-23 DIAGNOSIS — R569 Unspecified convulsions: Secondary | ICD-10-CM

## 2020-11-23 MED ORDER — LEVETIRACETAM 750 MG PO TABS: 750.0000 mg | ORAL_TABLET | Freq: Two times a day (BID) | ORAL | 5 refills | Status: DC

## 2020-11-23 NOTE — Progress Notes (Signed)
   Subjective:    Patient ID: [name], female    DOB: July 16, 1965, 55 y.o.   MRN: [id number]  HPI Patient arrives for a follow up on seizures. Patient states she is doing well on Keppra I did discuss with her the need to get her medications to make sure that her medicine can be continued.  We went ahead and sent in refills She will do her labs within the next few weeks She denies having any seizures States she has been doing well Depression doing well Working every day  Review of Systems     Objective:   Physical Exam  General-in no acute distress Eyes-no discharge Lungs-respiratory rate normal, CTA CV-no murmurs,RRR Extremities skin warm dry no edema Neuro grossly normal Behavior normal, alert       Assessment & Plan:  Seizures Good control Continue medication refills given If ongoing troubles or progressive troubles or worse to follow-up Follow-up again in 4 to 6 months Patient was encouraged to get her insurance through her workplace once that happens I would recommend a consultation with neurology

## 2020-11-24 LAB — COMPREHENSIVE METABOLIC PANEL
ALT: 12 IU/L (ref 0–32)
AST: 20 IU/L (ref 0–40)
Albumin/Globulin Ratio: 2.7 — ABNORMAL HIGH (ref 1.2–2.2)
Albumin: 4.6 g/dL (ref 3.8–4.9)
Alkaline Phosphatase: 53 IU/L (ref 44–121)
BUN/Creatinine Ratio: 18 (ref 9–23)
BUN: 16 mg/dL (ref 6–24)
Bilirubin Total: <0.2 mg/dL (ref 0.0–1.2)
CO2: 27 mmol/L (ref 20–29)
Calcium: 9.6 mg/dL (ref 8.7–10.2)
Chloride: 103 mmol/L (ref 96–106)
Creatinine, Ser: 0.88 mg/dL (ref 0.57–1.00)
Globulin, Total: 1.7 g/dL (ref 1.5–4.5)
Glucose: 76 mg/dL (ref 65–99)
Potassium: 4.2 mmol/L (ref 3.5–5.2)
Sodium: 139 mmol/L (ref 134–144)
Total Protein: 6.3 g/dL (ref 6.0–8.5)
eGFR: 78 mL/min/{1.73_m2} (ref >59)

## 2020-12-03 ENCOUNTER — Encounter (HOSPITAL_COMMUNITY): Payer: Self-pay | Admitting: Psychiatry

## 2020-12-03 ENCOUNTER — Other Ambulatory Visit: Payer: Self-pay

## 2020-12-03 ENCOUNTER — Telehealth (HOSPITAL_COMMUNITY): Payer: Self-pay | Admitting: *Deleted

## 2020-12-03 ENCOUNTER — Telehealth (INDEPENDENT_AMBULATORY_CARE_PROVIDER_SITE_OTHER): Payer: Self-pay | Admitting: Psychiatry

## 2020-12-03 DIAGNOSIS — F332 Major depressive disorder, recurrent severe without psychotic features: Secondary | ICD-10-CM

## 2020-12-03 DIAGNOSIS — F411 Generalized anxiety disorder: Secondary | ICD-10-CM

## 2020-12-03 DIAGNOSIS — F410 Panic disorder [episodic paroxysmal anxiety] without agoraphobia: Secondary | ICD-10-CM

## 2020-12-03 MED ORDER — CLONAZEPAM 0.5 MG PO TABS: 0.5000 mg | ORAL_TABLET | Freq: Every day | ORAL | 2 refills | Status: DC | PRN

## 2020-12-03 MED ORDER — MIRTAZAPINE 30 MG PO TABS: 30.0000 mg | ORAL_TABLET | Freq: Every day | ORAL | 2 refills | Status: DC

## 2020-12-03 NOTE — Progress Notes (Signed)
Virtual Visit via Video Note  I connected with [name] on 12/03/20 at  2:20 PM EDT by a video enabled telemedicine application and verified that I am speaking with the correct person using two identifiers.  Location: Patient: home Provider: home office   I discussed the limitations of evaluation and management by telemedicine and the availability of in person appointments. The patient expressed understanding and agreed to proceed.     I discussed the assessment and treatment plan with the patient. The patient was provided an opportunity to ask questions and all were answered. The patient agreed with the plan and demonstrated an understanding of the instructions.   The patient was advised to call back or seek an in-person evaluation if the symptoms worsen or if the condition fails to improve as anticipated.  I provided 20 minutes of non-face-to-face time during this encounter.   Diannia Rudereborah Donnalynn Wheeless, MD  [facility] MD/PA/NP OP Progress Note  12/03/2020 2:53 PM [name]  MRN:  [id number]  Chief Complaint:  Chief Complaint   Anxiety; Depression; Follow-up    HPI: This patient is a 55 year old divorced white female who lives alone in [place].  She works for a [employer].  The patient is seen today as a work in.  She called and stated that she was very upset about something that happened at work.  She states that she and her coworkers had to feel a video regarding unconscious bias and inclusion.  She took it very personally as if she was being told that she was a bad person and not excepting of other people.  When she left the workplace she was very upset and has been crying ever since.  I tried to explain to her repeatedly today that these types of trainings are mandatory now and almost every workplace around [place].  They are not directed personally on her.  I explained that they are to try to help us along better with her coworkers and understand her own  motivations.  After quite a long explanation she seemed understand.  She missed work today because she was too upset to go in and asked me for a work excuse.  She states that she will have to go back in tomorrow and "try to deal with that."  She is fairly anxious so I asked if we can add clonazepam to her regimen and she agreed.  She is generally sleeping well.  She denies thoughts of self-harm or suicidal ideation.  I offered to get her into therapy but she claims she cannot afford it Visit Diagnosis:    ICD-10-CM   1. MDD (major depressive disorder), recurrent severe, without psychosis (HCC)  F33.2     2. Generalized anxiety disorder with panic attacks  F41.1    F41.0       Past Psychiatric History: Patient had seen Dr. Kieth Brightlyodenbough for therapy and myself for medication management in the past.  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   IBS (irritable bowel syndrome)    Seizures (HCC) 10/23/14   x 2   Shingles 11/12/14   left eye    Past Surgical History:  Procedure Laterality Date   ABDOMINAL HYSTERECTOMY     partial 2010-Dr.Ferguson  I & D EXTREMITY Left 07/17/2014   Procedure: IRRIGATION AND DEBRIDEMENT left index finger;  Surgeon: Betha Loa, MD;  Location: [facility] OR;  Service: Orthopedics;  Laterality: Left;   NERVE, TENDON AND ARTERY REPAIR Left 07/17/2014   Procedure: NERVE, TENDON AND ARTERY REPAIR LEFT INDEX FINGER;  Surgeon: Betha Loa, MD;  Location: [facility] OR;  Service: Orthopedics;  Laterality: Left;   PARTIAL HYSTERECTOMY  2006   TUBAL LIGATION  1988    Family Psychiatric History: see below  Family History:  Family History  Problem Relation Age of Onset   Hypertension Sister    Depression Sister    Drug abuse Sister    Hypertension Mother     Osteoporosis Mother    Diabetes Mother    Alcohol abuse Mother    Depression Mother    Heart disease Father        MI   Depression Father    Depression Sister    Multiple sclerosis Sister    Heart attack Sister    Cancer - Lung Maternal Aunt    Leukemia Maternal Aunt    Colon cancer Neg Hx     Social History:  Social History   Socioeconomic History   Marital status: Divorced    Spouse name: [name]   Number of children: 1   Years of education: 16   Highest education level: Not on file  Occupational History   Occupation: home, yard maintenance    Comment: self employed  Tobacco Use   Smoking status: Every Day    Packs/day: 0.50    Years: 30.00    Pack years: 15.00    Types: Cigarettes   Smokeless tobacco: Never  Vaping Use   Vaping Use: Never used  Substance and Sexual Activity   Alcohol use: Yes    Comment: occasionally   Drug use: Not Currently    Types: Marijuana   Sexual activity: Yes    Birth control/protection: Surgical    Comment: tubal and hyst  Other Topics Concern   Not on file  Social History Narrative   Lives at home with husband   Caffeine use- coffee, sodas all day   Social Determinants of Health   Financial Resource Strain: Not on file  Food Insecurity: Not on file  Transportation Needs: Not on file  Physical Activity: Not on file  Stress: Not on file  Social Connections: Not on file    Allergies:  Allergies  Allergen Reactions   Cephalexin    Effexor [Venlafaxine Hcl] Other (See Comments)    Dizziness, pain behind eye   Ampicillin Rash   Neomycin Rash   Penicillins Rash    Did it involve swelling of the face/tongue/throat, SOB, or low BP? Unknown Did it involve sudden or severe rash/hives, skin peeling, or any reaction on the inside of your mouth or nose? Unknown Did you need to seek medical attention at a hospital or doctor's office? Unknown When did it last happen?       If all above answers are "NO", may proceed with  cephalosporin use.     Metabolic Disorder Labs: Lab Results  Component Value Date   HGBA1C 5.4 10/17/2018   MPG 108 10/17/2018   No results found for: PROLACTIN Lab Results  Component Value Date   CHOL 213 (H) 10/17/2018   TRIG 86 10/17/2018   HDL 41 10/17/2018   CHOLHDL 5.2 10/17/2018   VLDL 17 10/17/2018   LDLCALC 155 (H) 10/17/2018   LDLCALC 102 (H) 06/01/2017  Lab Results  Component Value Date   TSH 2.255 10/17/2018   TSH 1.947 07/22/2018    Therapeutic Level Labs: No results found for: LITHIUM No results found for: VALPROATE No components found for:  CBMZ  Current Medications: Current Outpatient Medications  Medication Sig Dispense Refill   clonazePAM (KLONOPIN) 0.5 MG tablet Take 1 tablet (0.5 mg total) by mouth daily as needed for anxiety. 30 tablet 2   albuterol (VENTOLIN HFA) 108 (90 Base) MCG/ACT inhaler Inhale 2 puffs into the lungs every 6 (six) hours as needed for wheezing or shortness of breath. 16 g 2   levETIRAcetam (KEPPRA) 750 MG tablet Take 1 tablet (750 mg total) by mouth 2 (two) times daily. 60 tablet 5   mirtazapine (REMERON) 30 MG tablet Take 1 tablet (30 mg total) by mouth at bedtime. 30 tablet 2   Multiple Vitamin (MULTIVITAMIN) tablet Take 1 tablet by mouth daily.     No current facility-administered medications for this visit.     Musculoskeletal: Strength & Muscle Tone: within normal limits Gait & Station: normal Patient leans: N/A  Psychiatric Specialty Exam: Review of Systems  Psychiatric/Behavioral:  The patient is nervous/anxious.   All other systems reviewed and are negative.  There were no vitals taken for this visit.There is no height or weight on file to calculate BMI.  General Appearance: Casual and Fairly Groomed  Eye Contact:  Good  Speech:  Clear and Coherent  Volume:  Normal  Mood:  Anxious, Dysphoric, and Irritable  Affect:  Tearful  Thought Process:  Goal Directed  Orientation:  Full (Time, Place, and Person)   Thought Content: Rumination   Suicidal Thoughts:  No  Homicidal Thoughts:  No  Memory:  Immediate;   Good Recent;   Good Remote;   Good  Judgement:  Fair  Insight:  Lacking  Psychomotor Activity:  Normal  Concentration:  Concentration: Good and Attention Span: Good  Recall:  Good  Fund of Knowledge: Good  Language: Good  Akathisia:  No  Handed:  Right  AIMS (if indicated): not done  Assets:  Communication Skills Desire for Improvement Physical Health Resilience Social Support Talents/Skills  ADL's:  Intact  Cognition: WNL  Sleep:  Good   Screenings: AIMS    Flowsheet Row [facility]  AIMS Total Score 0      AUDIT    Flowsheet Row [facility]  Alcohol Use Disorder Identification Test Final Score (AUDIT) 0      GAD-7    Flowsheet Row Office Visit from 02/12/2018 in [facility]  Total GAD-7 Score 4      PHQ2-9    Flowsheet Row [facility] Office Visit from 11/23/2020 in [facility] Video Visit from 11/16/2020 [facility] Video Visit from 08/24/2020 [facility] Office Visit from 09/02/2019 in [facility]  PHQ-2 Total Score 1 0 0 0 0  PHQ-9 Total Score -- 0 -- -- 4      Flowsheet Row Video Visit [facility] 12/03/2020 [facility] Video Visit from 11[facility]16/2020 [facility] Video Visit from 08/24/2020 [facility]  C-SSRS RISK CATEGORY No Risk No Risk No Risk        Assessment and Plan: This patient is a 55 year old female with  a history depression anxiety and 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  She has gotten unduly upset regarding a unconscious bias training at work.  For what ever reason she is taking this very personally and is generally in her thinking about it.  However she realizes she has to go back to work in order to provide for herself.  She is willing to look past the training and what it implies to her and to go back and "just do my job."  Therefore I will write her a note allowing her to return to work.  In addition to the mirtazapine 30 mg that she takes at bedtime for sleep and depression we will add clonazepam 0.5 mg daily as needed for anxiety.  She will return to see me in 2 weeks   [name], MD 12/03/2020, 2:53 PM

## 2020-12-03 NOTE — Telephone Encounter (Signed)
Informed patient and appt was made.    Per pt she did not tell her work why she was out of work. Per pt she called in sick. Per pt she wants provider to write her a work note so she can go back to work tomorrow. Per pt her work always requires Dr. Ross. Per pt if the doctor.  

## 2020-12-03 NOTE — Telephone Encounter (Signed)
I cant write a note until I can see her and evaluate if she is stable to return. It looks like we have a couple openings today. Also, working from home, I can post the note to my chart if she has it but I can't print it

## 2020-12-03 NOTE — Telephone Encounter (Signed)
She will need to make an appt.

## 2020-12-03 NOTE — Telephone Encounter (Signed)
Informed patient and sooner appt was made.

## 2020-12-03 NOTE — Telephone Encounter (Signed)
Informed patient and appt was made.    Per pt she did not tell her work why she was out of work. Per pt she called in sick. Per pt she wants provider to write her a work note so she can go back to work tomorrow. Per pt her work always requires Dr. Tenny Craw. Per pt if the doctor.

## 2020-12-03 NOTE — Telephone Encounter (Signed)
Patient called stating she would like to get a professional opinion on what happened at work yesterday. Per pt she had a breakdown at the end of work. Per pt her work was giving them training them on psychological things and per pt they are not professional people to be training with things like that and with what's in her head. Per pt she would like provider provider to give her some advice and she don't think this is legal for them to do. [phone].

## 2020-12-07 IMAGING — MR MR HEAD W/O CM
13 of 14 series · 43 of 48 positions shown · non-contrast
Comparison: 07/22/2018 MRI head and prior. 07/21/2018 head CT and
prior.

CLINICAL DATA: Ataxia, nontraumatic.

EXAM:
MRI HEAD WITHOUT CONTRAST
TECHNIQUE: Multiplanar, multiecho pulse sequences of the brain and surrounding
structures were obtained without intravenous contrast.

[Series 5: DWI · axial · 4.0mm · 0.88mm/px · z∈[-71,+68]mm · 4 of 36 slices shown (1 of 6)]
[im 1/36]
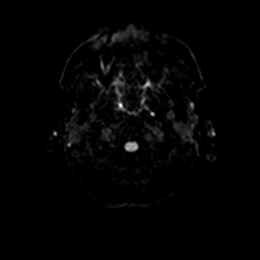
[im 12/36]
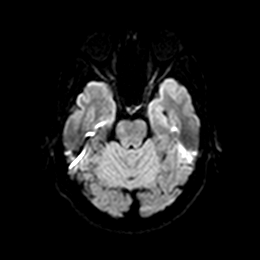
[im 24/36]
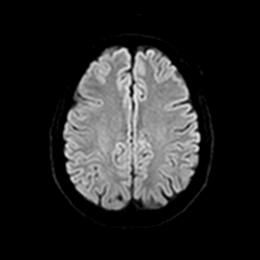
[im 36/36]
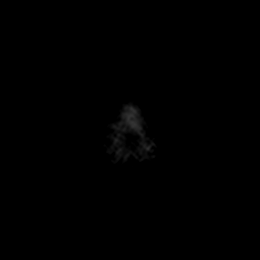

[Series 5: DWI · axial · 4.0mm · 0.88mm/px · z∈[-71,+68]mm · 4 of 36 slices shown (2 of 6)]
[im 1/36]
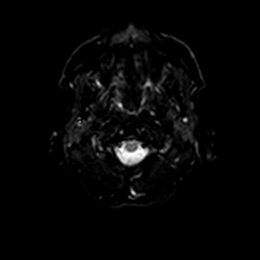
[im 12/36]
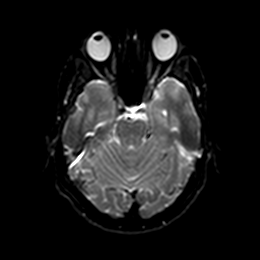
[im 24/36]
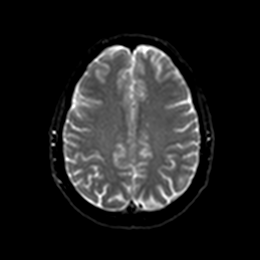
[im 36/36]
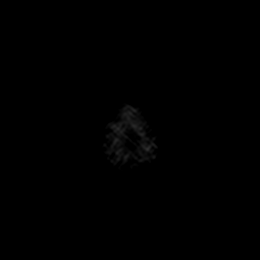

[Series 6: DWI · axial · 4.0mm · 0.88mm/px · z∈[-71,+68]mm · 4 of 35 slices shown (3 of 6)]
[im 1/35]
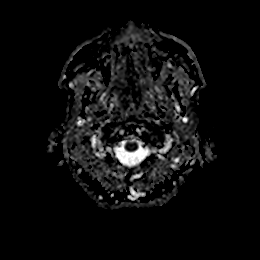
[im 12/35]
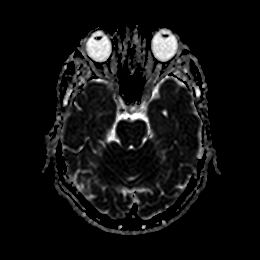
[im 23/35]
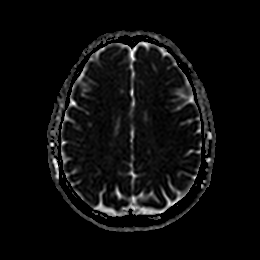
[im 35/35]
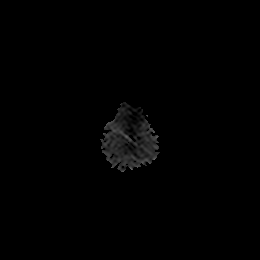

[Series 7: DWI · coronal · 5.0mm · 0.88mm/px · 2 of 26 slices shown (4 of 6)]
[im 1/26]
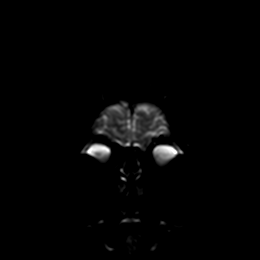
[im 26/26]
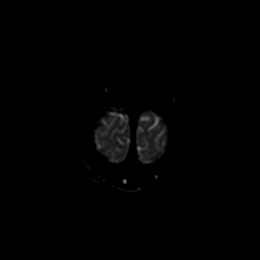

[Series 7: DWI · coronal · 5.0mm · 0.88mm/px · 2 of 26 slices shown (5 of 6)]
[im 1/26]
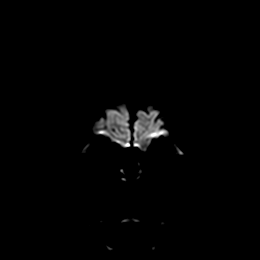
[im 26/26]
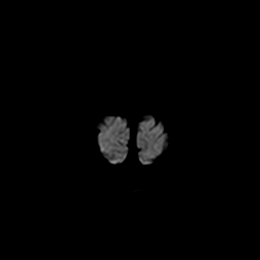

[Series 8: DWI · coronal · 5.0mm · 0.88mm/px · 2 of 26 slices shown (6 of 6)]
[im 1/26]
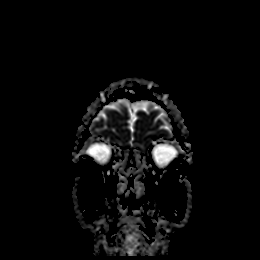
[im 26/26]
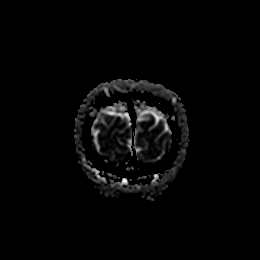

[Series 9: T1 · sagittal · 5.0mm · 0.94mm/px · 2 of 21 slices shown]
[im 1/21]
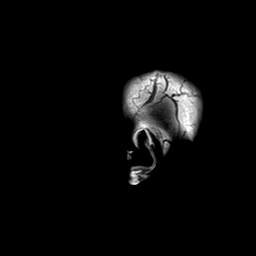
[im 21/21]
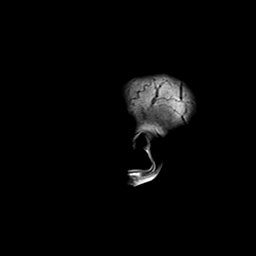

[Series 10: T2 · axial · 5.0mm · 0.72mm/px · z∈[-71,+68]mm · 2 of 21 slices shown (1 of 2)]
[im 1/21]
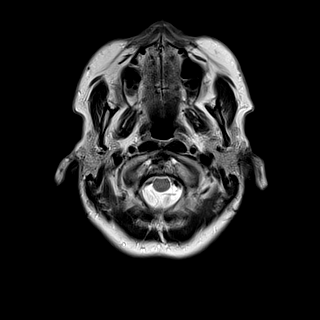
[im 21/21]
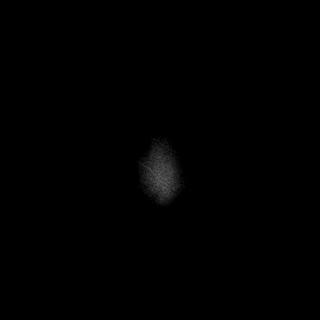

[Series 11: mag_images · axial · 3.0mm · 0.90mm/px · z∈[-87,+89]mm · 5 of 60 slices shown]
[im 1/60]
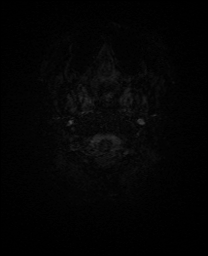
[im 15/60]
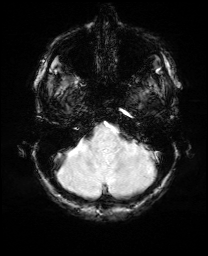
[im 30/60]
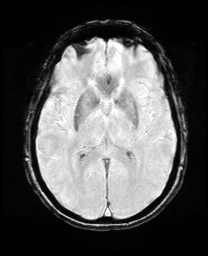
[im 45/60]
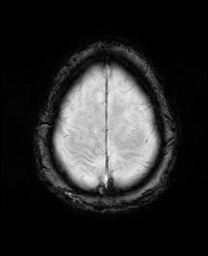
[im 60/60]
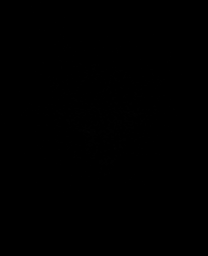

[Series 12: pha_images · axial · 3.0mm · 0.90mm/px · z∈[-87,+89]mm · 5 of 57 slices shown]
[im 1/57]
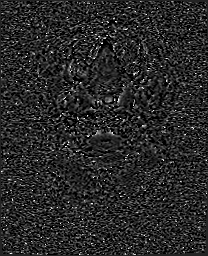
[im 15/57]
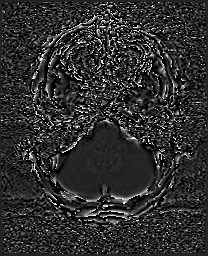
[im 29/57]
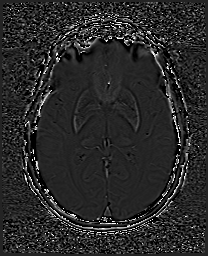
[im 43/57]
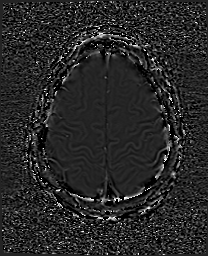
[im 57/57]
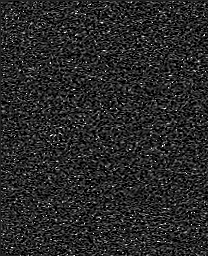

[Series 13: swi_images · axial · 3.0mm · 0.90mm/px · z∈[-87,+89]mm · 5 of 60 slices shown]
[im 1/60]
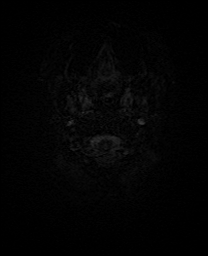
[im 15/60]
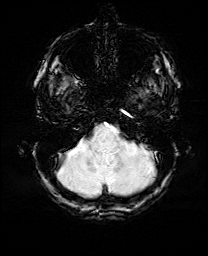
[im 30/60]
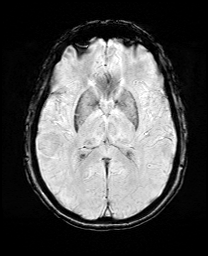
[im 45/60]
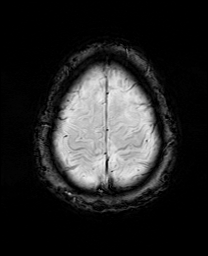
[im 60/60]
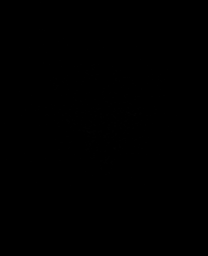

[Series 15: FLAIR · axial · 3.0mm · 0.45mm/px · z∈[-71,+63]mm · 4 of 46 slices shown]
[im 1/46]
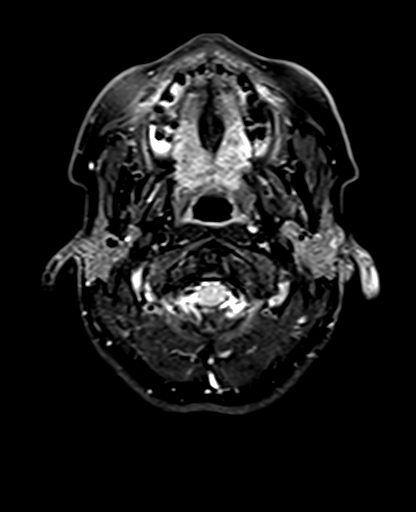
[im 16/46]
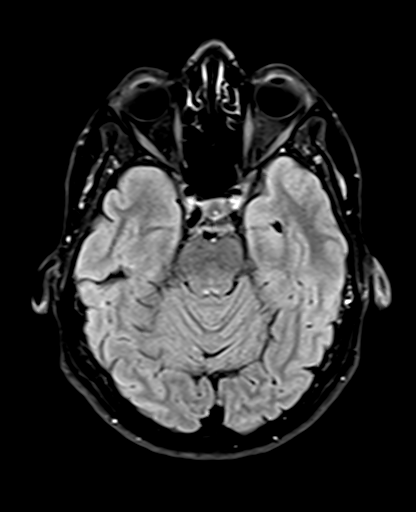
[im 31/46]
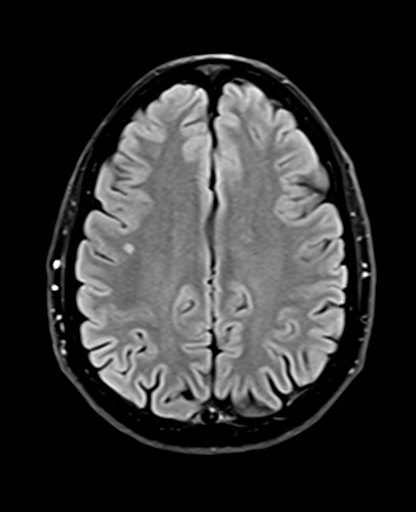
[im 46/46]
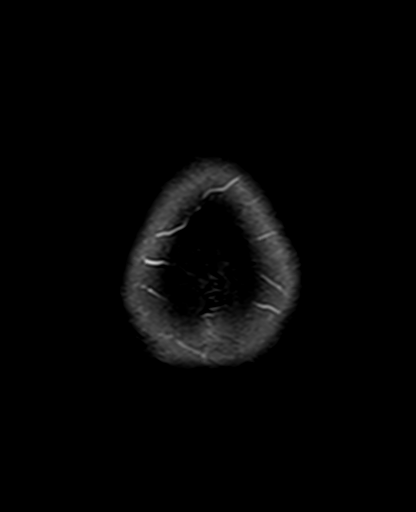

[Series 17: T2 · coronal · 5.0mm · 0.72mm/px · 2 of 28 slices shown (2 of 2)]
[im 1/28]
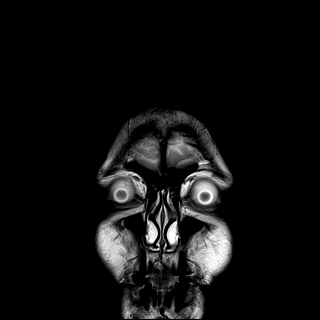
[im 28/28]
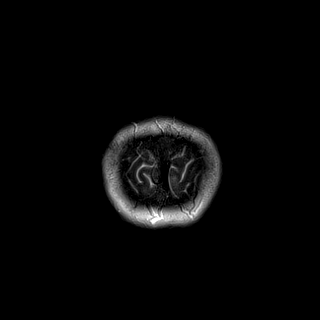

[43 of 48 positions shown; findings below may reference images not displayed]

FINDINGS: Brain: No diffusion-weighted signal abnormality. No intracranial
hemorrhage. No midline shift, ventriculomegaly or extra-axial fluid
collection. No mass lesion. Cerebral volume is within normal limits.
Few scattered T2/FLAIR hyperintense foci involving the bifrontal
white matter are nonspecific. Sequela of Chiari 1 malformation,
unchanged.

Vascular: Proximally patent major intracranial flow voids.

Skull and upper cervical spine: Normal marrow signal.

Sinuses/Orbits: Normal orbits. Clear paranasal sinuses. No mastoid
effusion.

Other: None.
IMPRESSION: No acute intracranial process.  Chiari 1 malformation, unchanged.

Few bifrontal white matter T2/FLAIR hyperintensities are
nonspecific. Differential includes migraines, post
infectious/inflammatory sequela, chronic microvascular ischemic
changes and demyelination.

## 2020-12-18 ENCOUNTER — Telehealth (HOSPITAL_COMMUNITY): Payer: Self-pay | Admitting: Psychiatry

## 2020-12-28 ENCOUNTER — Telehealth (HOSPITAL_COMMUNITY): Payer: Self-pay | Admitting: Psychiatry

## 2020-12-28 NOTE — Telephone Encounter (Signed)
Called to schedule f/u appt left vm 

## 2021-01-04 ENCOUNTER — Telehealth: Payer: Self-pay | Admitting: Family Medicine

## 2021-01-04 NOTE — Telephone Encounter (Signed)
Pt contacted. Pt states she is not having any suicidal thoughts, no feelings of hurting herself or others. Pt states that she had a break down at work on Friday. Pt told her employer that she needed time off but employer said that she needed to talk to them before taking time off. Pt states Dr.Ross had wrote a letter for patient to give to employer but pt states the letter violated her HIPPA because it had personal information in it that pt did not feel employer needed to know. Pt is prescribed Klonopin but is not taking it because she feels it is not effective; pt states she has told Dr. Tenny Craw that. Pt would like to possibly be placed on low dose Xanax. Please advise. Thank you (No appointments much this week but Eber Jones has some openings on Friday.)

## 2021-01-04 NOTE — Telephone Encounter (Signed)
Having trouble with her anxiety. Has missed work because of it. Wanted to speak to Dr. Lorin Picket and would accept a phone visit if possible this week.   CB#  [phone]

## 2021-01-04 NOTE — Telephone Encounter (Signed)
Nurses several things 1-can do a phone or in person visit with her later this week Wednesday afternoon 3:10 PM?  Friday 11:20 AM? 2-I am willing to help her with letter but we would do that after consultation visit with her 3-Xanax is now considered old school medicine.  Klonopin-clonazepam-is preferred.  If patient seeking to have this change she ought to discuss this with Dr. Tenny Craw

## 2021-01-04 NOTE — Telephone Encounter (Signed)
Pt contacted and verbalized understanding. Pt accepted in office visit for 3:10 pm on Wednesday 01/06/21. Pt verbalized understanding

## 2021-01-06 ENCOUNTER — Ambulatory Visit (INDEPENDENT_AMBULATORY_CARE_PROVIDER_SITE_OTHER): Payer: Self-pay | Admitting: Family Medicine

## 2021-01-06 ENCOUNTER — Other Ambulatory Visit: Payer: Self-pay

## 2021-01-06 VITALS — BP 93/56 | HR 52 | Temp 97.7°F | Ht 67.0 in | Wt 115.0 lb

## 2021-01-06 DIAGNOSIS — F411 Generalized anxiety disorder: Secondary | ICD-10-CM

## 2021-01-06 NOTE — Progress Notes (Signed)
   Subjective:    Patient ID: [name], female    DOB: 08-22-1965, 55 y.o.   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  She relates that apparently someone at work either does not like her or is trying to mess with her stuff It caused her to get very upset on Friday to where she was unable to work her boss let her go home this week she finds her self feeling anxious nervous on edge about it and she is requesting a few days off.  She denies being suicidal or homicidal.  She is under the care of psychiatry with medication counseling and will follow-up with them on a regular basis   Review of Systems     Objective:   Physical Exam Lungs are clear heart regular pulse normal   She is very nice.  Unfortunately her workplace is not treating her well.  Hopefully this will get better.    Assessment & Plan:  GAD Significant anxiety related to the recent episodes.  I believe it is in her best interest to stay home this week.  Work note was written.  She is to continue the medicine by psychiatry and to follow-up with them on a regular basis.  If she is having any other issues she can follow-up with [place].  Follow-up sooner if any issues.  Patient assured [place] that she was not suicidal or homicidal.

## 2021-02-15 ENCOUNTER — Telehealth (HOSPITAL_COMMUNITY): Payer: Self-pay | Admitting: *Deleted

## 2021-02-15 NOTE — Telephone Encounter (Signed)
Patient called stating she would like to cancel her appointment with Dr. Tenny Craw.   Per pt she found another provider and will not be coming back  Staff asked patient if she would like to share which facility she is changing to for documentation reasons and patient stated that provider does not need to know that.   Staff asked patient if she is voluntarily opting out in being dismissed due to having a new psychiatrist and patient stated she don't mind being dismissed as a patient here because she's not coming back so what ever provider wants to do but she's fine with it if that's what she wants.   Staff informed patient that questions were asked due to documentation reasons and patient verbalized understanding.    Staff informed patient that message will be sent to provider and she agreed and verbalized understanding.

## 2021-02-15 NOTE — Telephone Encounter (Signed)
Ok, thanks , I won't discharge her because she has done this before and ended up coming back

## 2021-02-16 NOTE — Telephone Encounter (Signed)
noted 

## 2021-02-17 ENCOUNTER — Telehealth (HOSPITAL_COMMUNITY): Payer: Self-pay | Admitting: Psychiatry

## 2021-03-02 ENCOUNTER — Other Ambulatory Visit (HOSPITAL_COMMUNITY): Payer: Self-pay | Admitting: Psychiatry

## 2021-03-08 ENCOUNTER — Telehealth: Payer: Self-pay | Admitting: Family Medicine

## 2021-03-08 DIAGNOSIS — F32A Depression, unspecified: Secondary | ICD-10-CM

## 2021-03-08 NOTE — Telephone Encounter (Signed)
Referral ordered in Epic. Left message to return call 

## 2021-03-08 NOTE — Addendum Note (Signed)
Addended by: Margaretha Sheffield on: 03/08/2021 01:44 PM   Modules accepted: Orders

## 2021-03-08 NOTE — Telephone Encounter (Signed)
Nurses please go ahead with referral to psychiatry and notify patient accordingly, depression

## 2021-03-08 NOTE — Telephone Encounter (Signed)
Patient stated she needed a psychiatrist referral from Dr. Lorin Picket. Also stated she knew the situation and what was going on and hoped he would send that without being seen. Please advise.  CB#  [phone]

## 2021-03-08 NOTE — Telephone Encounter (Signed)
Patient notified

## 2021-03-22 ENCOUNTER — Telehealth: Payer: Self-pay | Admitting: Family Medicine

## 2021-03-22 ENCOUNTER — Telehealth: Payer: Self-pay

## 2021-03-22 ENCOUNTER — Other Ambulatory Visit: Payer: Self-pay

## 2021-03-22 DIAGNOSIS — F411 Generalized anxiety disorder: Secondary | ICD-10-CM

## 2021-03-22 MED ORDER — MIRTAZAPINE 30 MG PO TABS
30.0000 mg | ORAL_TABLET | Freq: Every day | ORAL | 1 refills | Status: DC
Start: 2021-03-22 — End: 2021-04-21

## 2021-03-22 NOTE — Telephone Encounter (Signed)
Please go ahead with refill. May have 30-day event of mirtazapine with 1 refill

## 2021-03-22 NOTE — Telephone Encounter (Signed)
Patient is requesting a referral to [facility] she did decline the previous one to [facility]. Please advise

## 2021-03-22 NOTE — Telephone Encounter (Signed)
I am fine with going ahead with that referral as requested thank you

## 2021-03-22 NOTE — Telephone Encounter (Signed)
Patient is requesting a referral to [facility] in [place]. Toni Amend is waiting on it so she can schedule appointment for patient. Patient is requesting refill on mirtazapine 30 mg that was prescribe by Dr. Tenny Craw Please advise

## 2021-03-22 NOTE — Telephone Encounter (Signed)
Tried calling patient, no answer.

## 2021-03-22 NOTE — Telephone Encounter (Signed)
Medication refill sent and referral placed per request

## 2021-04-21 ENCOUNTER — Other Ambulatory Visit: Payer: Self-pay

## 2021-04-21 ENCOUNTER — Encounter: Payer: Self-pay | Admitting: Family Medicine

## 2021-04-21 ENCOUNTER — Ambulatory Visit: Payer: Self-pay | Admitting: Family Medicine

## 2021-04-21 VITALS — BP 103/68 | HR 66 | Temp 97.0°F | Wt 115.4 lb

## 2021-04-21 DIAGNOSIS — F411 Generalized anxiety disorder: Secondary | ICD-10-CM

## 2021-04-21 DIAGNOSIS — F32A Depression, unspecified: Secondary | ICD-10-CM

## 2021-04-21 DIAGNOSIS — F338 Other recurrent depressive disorders: Secondary | ICD-10-CM

## 2021-04-21 DIAGNOSIS — R569 Unspecified convulsions: Secondary | ICD-10-CM

## 2021-04-21 DIAGNOSIS — F324 Major depressive disorder, single episode, in partial remission: Secondary | ICD-10-CM

## 2021-04-21 MED ORDER — LEVETIRACETAM 750 MG PO TABS: 750.0000 mg | ORAL_TABLET | Freq: Two times a day (BID) | ORAL | 5 refills | Status: DC

## 2021-04-21 MED ORDER — MIRTAZAPINE 30 MG PO TABS: 30.0000 mg | ORAL_TABLET | Freq: Every day | ORAL | 4 refills | Status: DC

## 2021-04-21 NOTE — Progress Notes (Signed)
° °  Subjective:    Patient ID: [name], female    DOB: 01/09/1966, 56 y.o.   MRN: [id number]  HPI Pt here to talk with PCP about finding a new physiatrist. Pt was referred in November and December. [facility] does not take pt without insurance. [facility] only offers phone visits and pt is not interested in that. Pt did mention possibly seeing [facility] [phone] [address]).  This patient has had a seizure in the past but none currently she is doing very well overall she works third shift which is difficult on her she is trying to eat healthy She does smoke she was counseled to quit She does have moderate depression but not suicidal.  She states medicine is helping her she would like to have refills she is trying to get established with a psychiatrist and counselor but has been unable to do so recently Review of Systems     Objective:   Physical Exam  General-in no acute distress Eyes-no discharge Lungs-respiratory rate normal, CTA CV-no murmurs,RRR Extremities skin warm dry no edema Neuro grossly normal Behavior normal, alert       Assessment & Plan:  1. Seizure (HCC) Under good control continue current medications consider the possibility of stopping these in several months if she does not have any further seizures - levETIRAcetam (KEPPRA) 750 MG tablet; Take 1 tablet (750 mg total) by mouth 2 (two) times daily.  Dispense: 60 tablet; Refill: 5  2. GAD (generalized anxiety disorder) See psychiatry anxiety counseling would be beneficial - Ambulatory referral to Psychiatry  3. Depression, unspecified depression type Refills given on her medication.  Referral to psychiatry its been difficult for her to find 1 - Ambulatory referral to Psychiatry  4. Seasonal affective disorder (HCC) See above stable not suicidal  5. MDD (major depressive disorder), recurrent severe, without psychosis (HCC) Doing well stable not suicidal

## 2021-05-26 ENCOUNTER — Ambulatory Visit: Payer: Medicaid Other | Admitting: Family Medicine

## 2021-06-08 ENCOUNTER — Telehealth (INDEPENDENT_AMBULATORY_CARE_PROVIDER_SITE_OTHER): Payer: Self-pay | Admitting: Psychiatry

## 2021-06-08 ENCOUNTER — Encounter (HOSPITAL_COMMUNITY): Payer: Self-pay | Admitting: Psychiatry

## 2021-06-08 ENCOUNTER — Other Ambulatory Visit: Payer: Self-pay

## 2021-06-08 VITALS — BP 133/80 | HR 84 | Wt 113.4 lb

## 2021-06-08 DIAGNOSIS — F332 Major depressive disorder, recurrent severe without psychotic features: Secondary | ICD-10-CM

## 2021-06-08 DIAGNOSIS — F411 Generalized anxiety disorder: Secondary | ICD-10-CM

## 2021-06-08 DIAGNOSIS — F410 Panic disorder [episodic paroxysmal anxiety] without agoraphobia: Secondary | ICD-10-CM

## 2021-06-08 MED ORDER — MIRTAZAPINE 30 MG PO TABS: 30.0000 mg | ORAL_TABLET | Freq: Every day | ORAL | 4 refills | Status: DC

## 2021-06-08 NOTE — Progress Notes (Signed)
[facility] MD/PA/NP OP Progress Note  06/08/2021 1:25 PM [name]  MRN:  [id number]  Chief Complaint:  Chief Complaint  Patient presents with   Depression   Anxiety   Follow-up   HPI: This patient is a 56 year old divorced white female who lives alone in [place].  She was working for a [employer] but will be starting a new job next week.  The patient is seen today after about 6 months.  Actually she had called a few months ago and stated that she had found a new provider and was not coming back.  Apparently this did not work out.  She is still taking mirtazapine and her PCP Dr. Gerda Diss suggested that she come back here for follow-up.  In terms of mood the patient states she is doing fairly well.  She quit her job at the [employer] because she felt threatened by the supervisor.  She was then put on second shift and she did not like having to sleep during the day.  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  She states that her mood has been good.  She is staying  active and busy.  She is worried about her sister whom she describes as a Ecologist."  She denies significant anxiety depression thoughts of self-harm or suicidal ideation.  She is sleeping well.  She has lost about 7 pounds in the last year but claims she is going to add some nutritional shakes to gain the weight back. Visit Diagnosis:    ICD-10-CM   1. MDD (major depressive disorder), recurrent severe, without psychosis (HCC)  F33.2     2. Generalized anxiety disorder with panic attacks  F41.1    F41.0       Past Psychiatric History: The patient is seeing Dr. Kieth Brightly for therapy and myself for medication management in the past.  She was hospitalized in July 2020 after an overdose attempt  Past Medical History:  Past Medical History:  Diagnosis Date   Anxiety    Chiari malformation    COPD (chronic obstructive pulmonary disease) (HCC)    Depression    High risk HPV infection  November 2005   IBS (irritable bowel syndrome)    Seizures (HCC) 10/23/14   x 2   Shingles 11/12/14   left eye    Past Surgical History:  Procedure Laterality Date   ABDOMINAL HYSTERECTOMY     partial 2010-Dr.Ferguson   I & D EXTREMITY Left 07/17/2014   Procedure: IRRIGATION AND DEBRIDEMENT left index finger;  Surgeon: Betha Loa, MD;  [facility];  Service: Orthopedics;  Laterality: Left;   NERVE, TENDON AND ARTERY REPAIR Left 07/17/2014   Procedure: NERVE, TENDON AND ARTERY REPAIR LEFT INDEX FINGER;  Surgeon: Betha Loa, MD;  Location: [facility];  Service: Orthopedics;  Laterality: Left;   PARTIAL HYSTERECTOMY  2006   TUBAL LIGATION  1988    Family Psychiatric History: See below  Family History:  Family History  Problem Relation Age of Onset   Hypertension Sister    Depression Sister    Drug abuse Sister    Hypertension Mother    Osteoporosis Mother    Diabetes Mother    Alcohol abuse Mother    Depression Mother    Heart disease Father        MI   Depression Father    Depression Sister    Multiple sclerosis Sister    Heart attack Sister  Cancer - Lung Maternal Aunt    Leukemia Maternal Aunt    Colon cancer Neg Hx     Social History:  Social History   Socioeconomic History   Marital status: Divorced    Spouse name: [name]   Number of children: 1   Years of education: 16   Highest education level: Not on file  Occupational History   Occupation: home, yard maintenance    Comment: self employed  Tobacco Use   Smoking status: Every Day    Packs/day: 0.50    Years: 30.00    Pack years: 15.00    Types: Cigarettes   Smokeless tobacco: Never  Vaping Use   Vaping Use: Never used  Substance and Sexual Activity   Alcohol use: Yes    Comment: occasionally   Drug use: Not Currently    Types: Marijuana   Sexual activity: Yes    Birth control/protection: Surgical    Comment: tubal and hyst  Other Topics Concern   Not on file  Social History Narrative   Lives  at home with husband   Caffeine use- coffee, sodas all day   Social Determinants of Health   Financial Resource Strain: Not on file  Food Insecurity: Not on file  Transportation Needs: Not on file  Physical Activity: Not on file  Stress: Not on file  Social Connections: Not on file    Allergies:  Allergies  Allergen Reactions   Cephalexin    Effexor [Venlafaxine Hcl] Other (See Comments)    Dizziness, pain behind eye   Ampicillin Rash   Neomycin Rash   Penicillins Rash    Did it involve swelling of the face/tongue/throat, SOB, or low BP? Unknown Did it involve sudden or severe rash/hives, skin peeling, or any reaction on the inside of your mouth or nose? Unknown Did you need to seek medical attention at a hospital or doctor's office? Unknown When did it last happen?       If all above answers are NO, may proceed with cephalosporin use.     Metabolic Disorder Labs: Lab Results  Component Value Date   HGBA1C 5.4 10/17/2018   MPG 108 10/17/2018   No results found for: PROLACTIN Lab Results  Component Value Date   CHOL 213 (H) 10/17/2018   TRIG 86 10/17/2018   HDL 41 10/17/2018   CHOLHDL 5.2 10/17/2018   VLDL 17 10/17/2018   LDLCALC 155 (H) 10/17/2018   LDLCALC 102 (H) 06/01/2017   Lab Results  Component Value Date   TSH 2.255 10/17/2018   TSH 1.947 07/22/2018    Therapeutic Level Labs: No results found for: LITHIUM No results found for: VALPROATE No components found for:  CBMZ  Current Medications: Current Outpatient Medications  Medication Sig Dispense Refill   albuterol (VENTOLIN HFA) 108 (90 Base) MCG/ACT inhaler Inhale 2 puffs into the lungs every 6 (six) hours as needed for wheezing or shortness of breath. 16 g 2   levETIRAcetam (KEPPRA) 750 MG tablet Take 1 tablet (750 mg total) by mouth 2 (two) times daily. 60 tablet 5   Multiple Vitamin (MULTIVITAMIN) tablet Take 1 tablet by mouth daily.     mirtazapine (REMERON) 30 MG tablet Take 1 tablet (30 mg  total) by mouth at bedtime. 30 tablet 4   No current facility-administered medications for this visit.     Musculoskeletal: Strength & Muscle Tone: within normal limits Gait & Station: normal Patient leans: N/A  Psychiatric Specialty Exam: Review of Systems  All other  systems reviewed and are negative.  Blood pressure 133/80, pulse 84, weight 113 lb 6.4 oz (51.4 kg), SpO2 99 %.Body mass index is 17.76 kg/m.  General Appearance: Casual and Fairly Groomed  Eye Contact:  Good  Speech:  Clear and Coherent  Volume:  Normal  Mood:  Euthymic  Affect:  Appropriate and Congruent  Thought Process:  Goal Directed  Orientation:  Full (Time, Place, and Person)  Thought Content: WDL   Suicidal Thoughts:  No  Homicidal Thoughts:  No  Memory:  Immediate;   Good Recent;   Good Remote;   Fair  Judgement:  Good  Insight:  Fair  Psychomotor Activity:  Normal  Concentration:  Concentration: Good and Attention Span: Good  Recall:  Good  Fund of Knowledge: Good  Language: Good  Akathisia:  No  Handed:  Right  AIMS (if indicated): not done  Assets:  Communication Skills Desire for Improvement Physical Health Resilience Social Support Talents/Skills  ADL's:  Intact  Cognition: WNL  Sleep:  Good   Screenings: AIMS    Flowsheet Row [facility]  AIMS Total Score 0      AUDIT    Flowsheet Row Ad[facility]  Alcohol Use Disorder Identification Test Final Score (AUDIT) 0      GAD-7    Flowsheet Row Office Visit from 04/21/2021 in [facility] Office Visit from 01/06/2021 in [facility] Office Visit from 02/12/2018 in [facility]  Total GAD-7 Score 4 9 4       PHQ2-9    Flowsheet Row [facility] Office Visit from  04/21/2021 in [facility] Office Visit from 01/06/2021 in [facility] Video Visit from 12/03/2020 in [facility] Office Visit from 11/23/2020 in [facility]  PHQ-2 Total Score 1 2 3 1  0  PHQ-9 Total Score -- 8 10 -- 0      Flowsheet Row Video Visit [facility]08/2021 [facility] Video Visit from 12/03/2020 [facility] Video Visit from [facility]2020 [facility]  C-SSRS RISK CATEGORY No Risk No Risk No Risk        Assessment and Plan: This patient is a 56 year old female with a history of depression anxiety and 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  She states that she is doing well on her current regimen.  She will continue mirtazapine 30 mg at bedtime for sleep and depression.  She will return to see me in 4 months  Collaboration of Care: Collaboration of Care: Primary Care Provider AEB chart notes are available to PCP via the epic system  Patient/Guardian was advised Release of Information must be obtained prior to any record release in order to collaborate their care with an outside provider. Patient/Guardian was advised if they have not already done so to contact the registration department to sign all necessary forms in order for 01/16/2021 to release information regarding their care.   Consent: Patient/Guardian gives verbal consent for treatment and assignment of benefits for services provided during this visit. Patient/Guardian expressed understanding and agreed to proceed.    53, MD 06/08/2021, 1:25 PM

## 2021-07-08 ENCOUNTER — Telehealth: Payer: Self-pay

## 2021-07-08 DIAGNOSIS — R569 Unspecified convulsions: Secondary | ICD-10-CM

## 2021-07-08 NOTE — Telephone Encounter (Signed)
Caller name:Kielee Mallicoat  ? ?On DPR? :Yes ? ?Call back [phone] ? ?Provider they see: Gerda Diss  ? ?Reason for call:Pt wants to be referred to neurologist Dr. Joylene [facility]  ? ?

## 2021-07-08 NOTE — Telephone Encounter (Signed)
LMTRC

## 2021-07-08 NOTE — Telephone Encounter (Signed)
Patient may have referral to neurology.  I would recommend you verify with patient the reason for her referral so we can put it with a referral ?

## 2021-07-09 NOTE — Addendum Note (Signed)
Addended by: Marlowe Shores on: 07/09/2021 10:23 AM ? ? Modules accepted: Orders ? ?

## 2021-07-09 NOTE — Telephone Encounter (Signed)
Pt returned call and states she would like to get the dose of her seizure med reduced. Referral placed to neurology  ?

## 2021-08-19 ENCOUNTER — Ambulatory Visit: Payer: Medicaid Other | Admitting: Family Medicine

## 2021-09-08 ENCOUNTER — Encounter: Payer: Self-pay | Admitting: Family Medicine

## 2021-09-08 ENCOUNTER — Ambulatory Visit: Payer: Self-pay | Admitting: Family Medicine

## 2021-10-06 ENCOUNTER — Telehealth (INDEPENDENT_AMBULATORY_CARE_PROVIDER_SITE_OTHER): Payer: Self-pay | Admitting: Psychiatry

## 2021-10-06 ENCOUNTER — Encounter (HOSPITAL_COMMUNITY): Payer: Self-pay | Admitting: Psychiatry

## 2021-10-06 DIAGNOSIS — F410 Panic disorder [episodic paroxysmal anxiety] without agoraphobia: Secondary | ICD-10-CM

## 2021-10-06 DIAGNOSIS — F332 Major depressive disorder, recurrent severe without psychotic features: Secondary | ICD-10-CM

## 2021-10-06 DIAGNOSIS — F411 Generalized anxiety disorder: Secondary | ICD-10-CM

## 2021-10-06 MED ORDER — MIRTAZAPINE 30 MG PO TABS
30.0000 mg | ORAL_TABLET | Freq: Every day | ORAL | 4 refills | Status: DC
Start: 2021-10-06 — End: 2021-12-15

## 2021-10-06 NOTE — Progress Notes (Signed)
Virtual Visit via Video Note  I connected with [name] on 10/06/21 at 10:00 AM EDT by a video enabled telemedicine application and verified that I am speaking with the correct person using two identifiers.  Location: Patient: home Provider: office   I discussed the limitations of evaluation and management by telemedicine and the availability of in person appointments. The patient expressed understanding and agreed to proceed.      I discussed the assessment and treatment plan with the patient. The patient was provided an opportunity to ask questions and all were answered. The patient agreed with the plan and demonstrated an understanding of the instructions.   The patient was advised to call back or seek an in-person evaluation if the symptoms worsen or if the condition fails to improve as anticipated.  I provided 15 minutes of non-face-to-face time during this encounter.   [name], MD  [facility] MD/PA/NP OP Progress Note  10/06/2021 10:29 AM [name]  MRN:  [id number]  Chief Complaint:  Chief Complaint  Patient presents with   Depression   Anxiety   Follow-up   HPI: This patient is a 56 year old divorced white female who lives alone in [place].  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  The patient returns for follow-up after about 4 months she states she is doing okay for the most part.  She found a new job 2 months ago as a Location manager but is working second shift.  She finds this difficult as she does not like to sleep throughout the day and does not have much time to herself.  She is sleeping fairly well with the help with the mirtazapine.  She denies significant depression symptoms or anxiety.  Her sister is now living in an assisted living facility and she has less worries about her functioning. Visit Diagnosis:    ICD-10-CM   1. MDD (major depressive disorder), recurrent severe, without psychosis (HCC)  F33.2     2. Generalized  anxiety disorder with panic attacks  F41.1    F41.0       Past Psychiatric History: The patient was seeing Dr. Kieth Brightly for therapy and myself for medication management in the past.  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   IBS (irritable bowel syndrome)    Seizures (HCC) 10/23/14   x 2   Shingles 11/12/14   left eye    Past Surgical History:  Procedure Laterality Date   ABDOMINAL HYSTERECTOMY     partial 2010-Dr.Ferguson   I & D EXTREMITY Left 07/17/2014   Procedure: IRRIGATION AND DEBRIDEMENT left index finger;  Surgeon: Betha Loa, MD;  [facility];  Service: Orthopedics;  Laterality: Left;   NERVE, TENDON AND ARTERY REPAIR Left 07/17/2014   Procedure: NERVE, TENDON AND ARTERY REPAIR LEFT INDEX FINGER;  Surgeon: Betha Loa, MD;  [facility];  Service: Orthopedics;  Laterality: Left;   PARTIAL HYSTERECTOMY  2006   TUBAL LIGATION  1988    Family Psychiatric History: See below  Family History:  Family History  Problem Relation Age of Onset   Hypertension Sister    Depression Sister    Drug abuse Sister    Hypertension Mother  Osteoporosis Mother    Diabetes Mother    Alcohol abuse Mother    Depression Mother    Heart disease Father        MI   Depression Father    Depression Sister    Multiple sclerosis Sister    Heart attack Sister    Cancer - Lung Maternal Aunt    Leukemia Maternal Aunt    Colon cancer Neg Hx     Social History:  Social History   Socioeconomic History   Marital status: Divorced    Spouse name: [name]   Number of children: 1   Years of education: 16   Highest education level: Not on file  Occupational History   Occupation: home, yard maintenance    Comment: self employed  Tobacco Use   Smoking status:  Every Day    Packs/day: 0.50    Years: 30.00    Total pack years: 15.00    Types: Cigarettes   Smokeless tobacco: Never  Vaping Use   Vaping Use: Never used  Substance and Sexual Activity   Alcohol use: Yes    Comment: occasionally   Drug use: Not Currently    Types: Marijuana   Sexual activity: Yes    Birth control/protection: Surgical    Comment: tubal and hyst  Other Topics Concern   Not on file  Social History Narrative   Lives at home with husband   Caffeine use- coffee, sodas all day   Social Determinants of Health   Financial Resource Strain: Not on file  Food Insecurity: Not on file  Transportation Needs: Not on file  Physical Activity: Not on file  Stress: Not on file  Social Connections: Not on file    Allergies:  Allergies  Allergen Reactions   Cephalexin    Effexor [Venlafaxine Hcl] Other (See Comments)    Dizziness, pain behind eye   Ampicillin Rash   Neomycin Rash   Penicillins Rash    Did it involve swelling of the face/tongue/throat, SOB, or low BP? Unknown Did it involve sudden or severe rash/hives, skin peeling, or any reaction on the inside of your mouth or nose? Unknown Did you need to seek medical attention at a hospital or doctor's office? Unknown When did it last happen?       If all above answers are "NO", may proceed with cephalosporin use.     Metabolic Disorder Labs: Lab Results  Component Value Date   HGBA1C 5.4 10/17/2018   MPG 108 10/17/2018   No results found for: "PROLACTIN" Lab Results  Component Value Date   CHOL 213 (H) 10/17/2018   TRIG 86 10/17/2018   HDL 41 10/17/2018   CHOLHDL 5.2 10/17/2018   VLDL 17 10/17/2018   LDLCALC 155 (H) 10/17/2018   LDLCALC 102 (H) 06/01/2017   Lab Results  Component Value Date   TSH 2.255 10/17/2018   TSH 1.947 07/22/2018    Therapeutic Level Labs: No results found for: "LITHIUM" No results found for: "VALPROATE" No results found for: "CBMZ"  Current Medications: Current  Outpatient Medications  Medication Sig Dispense Refill   albuterol (VENTOLIN HFA) 108 (90 Base) MCG/ACT inhaler Inhale 2 puffs into the lungs every 6 (six) hours as needed for wheezing or shortness of breath. 16 g 2   levETIRAcetam (KEPPRA) 750 MG tablet Take 1 tablet (750 mg total) by mouth 2 (two) times daily. 60 tablet 5   mirtazapine (REMERON) 30 MG tablet Take 1 tablet (30 mg total) by mouth at  bedtime. 30 tablet 4   Multiple Vitamin (MULTIVITAMIN) tablet Take 1 tablet by mouth daily.     No current facility-administered medications for this visit.     Musculoskeletal: Strength & Muscle Tone: within normal limits Gait & Station: normal Patient leans: N/A  Psychiatric Specialty Exam: Review of Systems  All other systems reviewed and are negative.   There were no vitals taken for this visit.There is no height or weight on file to calculate BMI.  General Appearance: Casual and Fairly Groomed  Eye Contact:  Good  Speech:  Clear and Coherent  Volume:  Normal  Mood:  Euthymic  Affect:  Appropriate and Congruent  Thought Process:  Goal Directed  Orientation:  Full (Time, Place, and Person)  Thought Content: WDL   Suicidal Thoughts:  No  Homicidal Thoughts:  No  Memory:  Immediate;   Good Recent;   Good Remote;   NA  Judgement:  Good  Insight:  Fair  Psychomotor Activity:  Normal  Concentration:  Concentration: Good and Attention Span: Good  Recall:  Good  Fund of Knowledge: Good  Language: Good  Akathisia:  No  Handed:  Right  AIMS (if indicated): not done  Assets:  Communication Skills Desire for Improvement Physical Health Resilience Social Support Talents/Skills  ADL's:  Intact  Cognition: WNL  Sleep:  Fair   Screenings: AIMS    Flowsheet Row [facility]  AIMS Total Score 0      AUDIT    Flowsheet Row Ad[facility]  Alcohol Use Disorder Identification Test Final Score (AUDIT) 0      GAD-7    Flowsheet Row Office Visit from 04/21/2021 in [facility] Office Visit from 01/06/2021 in [facility] Office Visit from 02/12/2018 in [facility]  Total GAD-7 Score 4 9 4       PHQ2-9    Flowsheet Row Video Visit [facility]/06[facility]2021 [facility] Video Visit from [facility]/08/2021 [facility] Office Visit from 04/21/2021 in [facility] Office Visit from 01/06/2021 in [facility] Video Visit from 12/03/2020 [facility]  PHQ-2 Total Score 1 1 2 3 1   PHQ-9 Total Score -- -- 8 10 --      Flowsheet Row Video Visit [facility] 10/[facility]2021 [facility] Video Visit from 06/08/2021 in [facility] Video Visit from [facility]/03/2020 [facility]  C-SSRS RISK CATEGORY No Risk No Risk No Risk        Assessment and Plan: This patient is a 56 year old female with a history of depression anxiety and 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  She continues to do well on her current medication.  She thinks she will feel better when she can get a first shift job.  She will continue mirtazapine 30 mg at bedtime for sleep and depression.  She will return to see me in 4 months  Collaboration of Care: Collaboration of Care: Primary Care Provider AEB notes are available to PCP on the epic system  Patient/Guardian was advised Release of Information must be obtained prior to any record release in order to collaborate their care with an outside provider. Patient/Guardian was advised if they have not already done so to contact the registration department to sign all necessary forms in order for 12/05/2020  to release  information regarding their care.   Consent: Patient/Guardian gives verbal consent for treatment and assignment of benefits for services provided during this visit. Patient/Guardian expressed understanding and agreed to proceed.    [name], MD 10/06/2021, 10:29 AM

## 2021-10-22 ENCOUNTER — Encounter: Payer: Self-pay | Admitting: Family Medicine

## 2021-10-22 ENCOUNTER — Ambulatory Visit (INDEPENDENT_AMBULATORY_CARE_PROVIDER_SITE_OTHER): Payer: Medicaid Other | Admitting: Family Medicine

## 2021-10-22 VITALS — BP 107/71 | HR 54 | Wt 113.8 lb

## 2021-10-22 DIAGNOSIS — Z1231 Encounter for screening mammogram for malignant neoplasm of breast: Secondary | ICD-10-CM

## 2021-10-22 DIAGNOSIS — Z13220 Encounter for screening for lipoid disorders: Secondary | ICD-10-CM

## 2021-10-22 DIAGNOSIS — Z79899 Other long term (current) drug therapy: Secondary | ICD-10-CM

## 2021-10-22 DIAGNOSIS — Z1211 Encounter for screening for malignant neoplasm of colon: Secondary | ICD-10-CM

## 2021-10-22 DIAGNOSIS — R569 Unspecified convulsions: Secondary | ICD-10-CM

## 2021-10-22 MED ORDER — LEVETIRACETAM 750 MG PO TABS: 750.0000 mg | ORAL_TABLET | Freq: Two times a day (BID) | ORAL | 12 refills | Status: DC

## 2021-10-22 NOTE — Progress Notes (Signed)
   Subjective:    Patient ID: [name], female    DOB: [birth year], 56 y.o.   MRN: [id number]  HPI Pt arrives to follow up on medications. Taking meds as prescribed. Pt states no concerns at this time.  Patient does have underlying anxiety She sees a psychiatrist on a regular basis She does smoke She has been counseled to quit She does have a history of seizures She takes her medicine Has not had any seizures recently She is behind on preventative health I encouraged her to get a mammogram and colonoscopy she would like to get Cologuard does not have any personal or family history of colon cancer  Review of Systems     Objective:   Physical Exam General-in no acute distress Eyes-no discharge Lungs-respiratory rate normal, CTA CV-no murmurs,RRR Extremities skin warm dry no edema Neuro grossly normal Behavior normal, alert        Assessment & Plan:  1. Seizure (HCC) Has not had any seizures recently continue the current medication lab work ordered - levETIRAcetam (KEPPRA) 750 MG tablet; Take 1 tablet (750 mg total) by mouth 2 (two) times daily.  Dispense: 60 tablet; Refill: 12 - Lipid panel - Basic metabolic panel - Hepatic function panel  2. Encounter for screening mammogram for malignant neoplasm of breast Recommend for patient to get mammogram she states she will schedule - MM 3D SCREEN BREAST BILATERAL  3. Screening for colon cancer Set up Cologuard - Cologuard  4. High risk medication use Lab work ordered - Lipid panel - Basic metabolic panel - Hepatic function panel  5. Screening, lipid Lab work ordered - Lipid panel - Basic metabolic panel - Hepatic function panel  Follow-up by springtime

## 2021-11-22 ENCOUNTER — Encounter: Payer: Self-pay | Admitting: Nurse Practitioner

## 2021-11-22 ENCOUNTER — Ambulatory Visit: Payer: Self-pay | Admitting: Nurse Practitioner

## 2021-11-22 VITALS — BP 124/75 | HR 54 | Temp 97.5°F | Wt 117.0 lb

## 2021-11-22 DIAGNOSIS — M25561 Pain in right knee: Secondary | ICD-10-CM

## 2021-11-22 DIAGNOSIS — M25562 Pain in left knee: Secondary | ICD-10-CM

## 2021-11-22 DIAGNOSIS — M25552 Pain in left hip: Secondary | ICD-10-CM

## 2021-11-22 NOTE — Progress Notes (Unsigned)
   Subjective:    Patient ID: [name], female    DOB: [birth year], 56 y.o.   MRN: [id number]  HPI Pt arrives due to bilateral knee pain and left hip pain. Having swelling in knees. Pt was referred to ortho by chiropractor. Seeing chiropractor for back problem. Pt seeing ortho next Monday. Pain in hip mostly at night.    Review of Systems     Objective:   Physical Exam        Assessment & Plan:

## 2021-11-23 ENCOUNTER — Encounter: Payer: Self-pay | Admitting: Nurse Practitioner

## 2021-11-26 ENCOUNTER — Telehealth: Payer: Self-pay

## 2021-11-26 NOTE — Telephone Encounter (Signed)
Please advise. Thank you

## 2021-11-26 NOTE — Telephone Encounter (Signed)
Caller name:Billie Willa Rough   On DPR? :Yes  Call back number:[phone]  Provider they see: Luking   Reason for call:[employer] Disability form placed in Dr Lorin Picket folder up front needs to be completed

## 2021-11-28 NOTE — Telephone Encounter (Signed)
Nurses Need more information Is this [employer] form for disability regarding her work excuse from [phone]?  Or is there any additional reasons this is being filled out? Having a little bit to go on would be helpful Thank you

## 2021-11-29 NOTE — Telephone Encounter (Signed)
I filled out this form as best as possible It does have some administrative portions to fill in Please also confirm with the patient it is my understanding she plans to go back to work without restrictions.  Certainly if Dr. Cleophas Dunker has restrictions those would apply It would also be fine to dictate a letter stating that she may return to work on the 22nd Please finish out the form and give her that as well thank you-FMLA

## 2021-11-29 NOTE — Telephone Encounter (Signed)
Pt contacted. Pt states the form is just for last week. Pt states employer was wanting her to start filling out form. Pt is suppose to go back to work tomorrow. Pt has a "return to work" form that she is going to be bringing by office. Pt states she doesn't plan to be out of work anymore. Pt states hip/knee pain has not improved. Did see Dr.Bassett this morning and states that pt has bursitis in hip. Form in provider office. Please advise. Thank you

## 2021-11-30 NOTE — Telephone Encounter (Signed)
Patient called back and stated she is not going to file for disability and does not need papers filled out

## 2021-11-30 NOTE — Telephone Encounter (Signed)
Form is up front. Alcario Drought will call patient when administrative part is complete.

## 2021-12-15 ENCOUNTER — Telehealth (INDEPENDENT_AMBULATORY_CARE_PROVIDER_SITE_OTHER): Payer: Self-pay | Admitting: Psychiatry

## 2021-12-15 ENCOUNTER — Encounter (HOSPITAL_COMMUNITY): Payer: Self-pay | Admitting: Psychiatry

## 2021-12-15 ENCOUNTER — Telehealth (HOSPITAL_COMMUNITY): Payer: Self-pay | Admitting: *Deleted

## 2021-12-15 DIAGNOSIS — F332 Major depressive disorder, recurrent severe without psychotic features: Secondary | ICD-10-CM

## 2021-12-15 DIAGNOSIS — F411 Generalized anxiety disorder: Secondary | ICD-10-CM

## 2021-12-15 DIAGNOSIS — F410 Panic disorder [episodic paroxysmal anxiety] without agoraphobia: Secondary | ICD-10-CM

## 2021-12-15 MED ORDER — MIRTAZAPINE 30 MG PO TABS
30.0000 mg | ORAL_TABLET | Freq: Every day | ORAL | 4 refills | Status: DC
Start: 2021-12-15 — End: 2022-01-31

## 2021-12-15 NOTE — Telephone Encounter (Signed)
Patient called wanting provider to please give her a call. Per pt she would like to talk about continuous crying at work. Per pt she told her employer that she was leaving and left work because she could not stop crying.   [phone]

## 2021-12-15 NOTE — Telephone Encounter (Signed)
Please offer pt 4:00 appt today

## 2021-12-15 NOTE — Telephone Encounter (Signed)
Appt made
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
## 2021-12-16 ENCOUNTER — Telehealth (HOSPITAL_COMMUNITY): Payer: Self-pay | Admitting: *Deleted

## 2021-12-16 NOTE — Telephone Encounter (Signed)
Informed patient that the forms she dropped off to get completed by provider is completed and ready to be picked up.  Patient agreed and verbalized understanding.

## 2021-12-16 NOTE — Telephone Encounter (Signed)
Informed patient that her letter is ready for pick up.

## 2021-12-28 ENCOUNTER — Telehealth (INDEPENDENT_AMBULATORY_CARE_PROVIDER_SITE_OTHER): Payer: Self-pay | Admitting: Psychiatry

## 2021-12-28 ENCOUNTER — Encounter (HOSPITAL_COMMUNITY): Payer: Self-pay | Admitting: Psychiatry

## 2021-12-28 DIAGNOSIS — F332 Major depressive disorder, recurrent severe without psychotic features: Secondary | ICD-10-CM
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
## 2021-12-29 ENCOUNTER — Telehealth (HOSPITAL_COMMUNITY): Payer: Self-pay

## 2021-12-29 NOTE — Telephone Encounter (Signed)
I would suggest staying out until they finished investigation. Ican't make specific suggestions about she can work with or around
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
## 2021-12-29 NOTE — Telephone Encounter (Signed)
Pt called asking for update on Accomodation Paperwork

## 2021-12-30 NOTE — Telephone Encounter (Signed)
Patient called stating she would like to see if provider was completed with her return paperwork and she will have to put on there her return date to work as well.
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
## 2021-12-30 NOTE — Telephone Encounter (Signed)
When does she want to return? She claims she wants to stay out until the investigation is completed

## 2022-01-04 ENCOUNTER — Encounter (HOSPITAL_COMMUNITY): Payer: Self-pay

## 2022-01-04 ENCOUNTER — Telehealth: Payer: Self-pay

## 2022-01-04 NOTE — Telephone Encounter (Signed)
Caller name:Oluwasemilore Ishmael Holter   On DPR? :Yes  Call back number:[phone]  Provider they see: [place]   Reason for call:Pt called crying wanted appt for today or tomorrow with Dr Nicki Reaper for anxiety I was going to put her with Dr Lacinda Axon or Barbee Shropshire  because I do not have a spot for Dr Nicki Reaper she said he was the only one that understood her anxiety she hung up. Pt was upset and needed appt is there any where we could put her or what advice on what to do with her?

## 2022-01-04 NOTE — Telephone Encounter (Signed)
Nurses-you have my permission to give her an appointment tomorrow at 3:50 PM that would be on Wednesday thank you if for some reason that is not working for the patient let me know-may be able to accommodate or possibly see her on Friday

## 2022-01-05 ENCOUNTER — Other Ambulatory Visit: Payer: Self-pay | Admitting: Family Medicine

## 2022-01-05 ENCOUNTER — Telehealth (INDEPENDENT_AMBULATORY_CARE_PROVIDER_SITE_OTHER): Payer: Self-pay | Admitting: Psychiatry

## 2022-01-05 ENCOUNTER — Encounter (HOSPITAL_COMMUNITY): Payer: Self-pay | Admitting: Psychiatry

## 2022-01-05 DIAGNOSIS — F332 Major depressive disorder, recurrent severe without psychotic features: Secondary | ICD-10-CM

## 2022-01-05 DIAGNOSIS — F410 Panic disorder [episodic paroxysmal anxiety] without agoraphobia: Secondary | ICD-10-CM

## 2022-01-05 DIAGNOSIS — F411 Generalized anxiety disorder: Secondary | ICD-10-CM

## 2022-01-05 MED ORDER — ALPRAZOLAM 0.5 MG PO TABS: 0.5000 mg | ORAL_TABLET | Freq: Two times a day (BID) | ORAL | 2 refills | Status: AC | PRN

## 2022-01-05 NOTE — Telephone Encounter (Signed)
[facility] and also sent Mychart message

## 2022-01-05 NOTE — Telephone Encounter (Signed)
Pt states she was able to see Dr.Ross this afternoon.

## 2022-01-05 NOTE — Progress Notes (Signed)
Virtual Visit via Video Note  I connected with [name] on 01/05/22 at  2:20 PM EDT by a video enabled telemedicine application and verified that I am speaking with the correct person using two identifiers.  Location: Patient: home Provider: office   I discussed the limitations of evaluation and management by telemedicine and the availability of in person appointments. The patient expressed understanding and agreed to proceed.     I discussed the assessment and treatment plan with the patient. The patient was provided an opportunity to ask questions and all were answered. The patient agreed with the plan and demonstrated an understanding of the instructions.   The patient was advised to call back or seek an in-person evaluation if the symptoms worsen or if the condition fails to improve as anticipated.  I provided 15 minutes of non-face-to-face time during this encounter.   [name], MD  [facility] MD/PA/NP OP Progress Note  01/05/2022 3:16 PM [name]  MRN:  [id number]  Chief Complaint:  Chief Complaint  Patient presents with   Anxiety   Depression   Follow-up   HPI: This patient is a 56 year old divorced white female who lives alone in [place].  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  The patient returns for follow-up after about a week.  As noted in previous notes she is having a lot of struggles at work.  She had felt harassed and uncomfortable.  She had had a severe panic attack on 12/15/2021 at work and I took her out of work until 12/30/2021.  However her job did not let her come back because they were unclear about the accommodations.  The patient now is very anxious about not working and wants to try to go back.  She is having a lot of anxiety.  She has had a good response to low-dose Xanax in the past so we can add this.  She denies any thoughts of self-harm or suicide but does seem very stressed.  I asked her repeatedly if she felt ready to  go back to work and she said that she did.  If she feels harassed again she states that she will talk to her supervisor a possibly hand in her resignation.  She is looking for other jobs. Visit Diagnosis:    ICD-10-CM   1. MDD (major depressive disorder), recurrent severe, without psychosis ([place])  F33.2     2. Generalized anxiety disorder with panic attacks  F41.1    F41.0       Past Psychiatric History: The patient was seeing Dr. Sima Matas for therapy and myself for medication management in the past.  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   IBS (irritable bowel syndrome)    Seizures ([place]) 10/23/14   x 2   Shingles 11/12/14   left eye    Past Surgical History:  Procedure Laterality Date   ABDOMINAL HYSTERECTOMY     partial 2010-Dr.Ferguson   I & D EXTREMITY Left 07/17/2014   Procedure: IRRIGATION AND DEBRIDEMENT left index finger;  Surgeon: Leanora Cover, MD;  Location: [facility];  Service: Orthopedics;  Laterality: Left;   NERVE, TENDON AND ARTERY REPAIR Left 07/17/2014   Procedure: NERVE, TENDON AND  ARTERY REPAIR LEFT INDEX FINGER;  Surgeon: Leanora Cover, MD;  Location: [place];  Service: Orthopedics;  Laterality: Left;   PARTIAL HYSTERECTOMY  2006   TUBAL LIGATION  1988    Family Psychiatric History: See below  Family History:  Family History  Problem Relation Age of Onset   Hypertension Sister    Depression Sister    Drug abuse Sister    Hypertension Mother    Osteoporosis Mother    Diabetes Mother    Alcohol abuse Mother    Depression Mother    Heart disease Father        MI   Depression Father    Depression Sister    Multiple sclerosis Sister    Heart attack Sister    Cancer - Lung Maternal Aunt    Leukemia Maternal Aunt    Colon cancer Neg Hx      Social History:  Social History   Socioeconomic History   Marital status: Divorced    Spouse name: Herbie [place]   Number of children: 1   Years of education: 16   Highest education level: Not on file  Occupational History   Occupation: home, yard maintenance    Comment: self employed  Tobacco Use   Smoking status: Every Day    Packs/day: 0.50    Years: 30.00    Total pack years: 15.00    Types: Cigarettes   Smokeless tobacco: Never  Vaping Use   Vaping Use: Never used  Substance and Sexual Activity   Alcohol use: Yes    Comment: occasionally   Drug use: Not Currently    Types: Marijuana   Sexual activity: Yes    Birth control/protection: Surgical    Comment: tubal and hyst  Other Topics Concern   Not on file  Social History Narrative   Lives at home with husband   Caffeine use- coffee, sodas all day   Social Determinants of Health   Financial Resource Strain: Not on file  Food Insecurity: Not on file  Transportation Needs: Not on file  Physical Activity: Not on file  Stress: Not on file  Social Connections: Not on file    Allergies:  Allergies  Allergen Reactions   Cephalexin    Effexor [Venlafaxine Hcl] Other (See Comments)    Dizziness, pain behind eye   Ampicillin Rash   Neomycin Rash   Penicillins Rash    Did it involve swelling of the face/tongue/throat, SOB, or low BP? Unknown Did it involve sudden or severe rash/hives, skin peeling, or any reaction on the inside of your mouth or nose? Unknown Did you need to seek medical attention at a hospital or doctor's office? Unknown When did it last happen?       If all above answers are "NO", may proceed with cephalosporin use.     Metabolic Disorder Labs: Lab Results  Component Value Date   HGBA1C 5.4 10/17/2018   MPG 108 10/17/2018   No results found for: "PROLACTIN" Lab Results  Component Value Date   CHOL 213 (H) 10/17/2018   TRIG 86 10/17/2018   HDL 41 10/17/2018   CHOLHDL 5.2 10/17/2018    VLDL 17 10/17/2018   LDLCALC 155 (H) 10/17/2018   LDLCALC 102 (H) 06/01/2017   Lab Results  Component Value Date   TSH 2.255 10/17/2018   TSH 1.947 07/22/2018    Therapeutic Level Labs: No results found for: "LITHIUM" No results found for: "VALPROATE" No results found for: "CBMZ"  Current Medications: Current Outpatient  Medications  Medication Sig Dispense Refill   ALPRAZolam (XANAX) 0.5 MG tablet Take 1 tablet (0.5 mg total) by mouth 2 (two) times daily as needed for anxiety. 60 tablet 2   levETIRAcetam (KEPPRA) 750 MG tablet Take 1 tablet (750 mg total) by mouth 2 (two) times daily. 60 tablet 12   mirtazapine (REMERON) 30 MG tablet Take 1 tablet (30 mg total) by mouth at bedtime. 30 tablet 4   Multiple Vitamin (MULTIVITAMIN) tablet Take 1 tablet by mouth daily.     No current facility-administered medications for this visit.     Musculoskeletal: Strength & Muscle Tone: within normal limits Gait & Station: normal Patient leans: N/A  Psychiatric Specialty Exam: Review of Systems  Psychiatric/Behavioral:  Positive for dysphoric mood. The patient is nervous/anxious.   All other systems reviewed and are negative.   There were no vitals taken for this visit.There is no height or weight on file to calculate BMI.  General Appearance: Casual and Fairly Groomed  Eye Contact:  Good  Speech:  Clear and Coherent  Volume:  Normal  Mood:  Anxious and Dysphoric  Affect:  Flat  Thought Process:  Goal Directed  Orientation:  Full (Time, Place, and Person)  Thought Content: Rumination   Suicidal Thoughts:  No  Homicidal Thoughts:  No  Memory:  Immediate;   Good Recent;   Good Remote;   Fair  Judgement:  Good  Insight:  Fair  Psychomotor Activity:  Normal  Concentration:  Concentration: Good and Attention Span: Good  Recall:  Good  Fund of Knowledge: Good  Language: Good  Akathisia:  No  Handed:  Right  AIMS (if indicated): not done  Assets:  Communication Skills Desire  for Improvement Physical Health Resilience Social Support Talents/Skills  ADL's:  Intact  Cognition: WNL  Sleep:  Good   Screenings: AIMS    Flowsheet Row Admission (Discharged) from 10/16/2018 in [facility]  AIMS Total Score 0      AUDIT    Flowsheet Row Admission (Discharged) from 10/16/2018 in [facility]  Alcohol Use Disorder Identification Test Final Score (AUDIT) 0      GAD-7    Flowsheet Row Office Visit from 04/21/2021 in [place] Office Visit from 01/06/2021 in [place] Office Visit from 02/12/2018 in [place] [facility]  Total GAD-7 Score 4 9 4       PHQ2-9    Flowsheet Row Video Visit from 01/05/2022 in [facility] Video Visit from 12/15/2021 in [place] [facility] Office Visit from 10/22/2021 in [facility] Video Visit from 10/06/2021 in [facility] Video Visit from 06/08/2021 in [facility]  PHQ-2 Total Score 2 2 2 1 1   PHQ-9 Total Score 6 4 4  -- --      Flowsheet Row Video Visit from 01/05/2022 in [facility] Video Visit from 12/15/2021 in [facility] Video Visit from 10/06/2021 in [facility] No Risk No Risk No Risk        Assessment and Plan: This patient is a 56 year old female with a history of depression anxiety 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  At this point she really wants to try to return to work even though she knows that will be stressful.  I asked her repeatedly and she is can continue to say that she  felt ready to go back.  She is more anxious so we will start Xanax 0.5 mg twice daily as needed for anxiety.   She will continue mirtazapine 30 mg at bedtime for sleep and depression.  She will return to see me in 4 weeks  Collaboration of Care: Collaboration of Care: Primary Care Provider AEB notes are shared with PCP through the epic system  Patient/Guardian was advised Release of Information must be obtained prior to any record release in order to collaborate their care with an outside provider. Patient/Guardian was advised if they have not already done so to contact the registration department to sign all necessary forms in order for [place] to release information regarding their care.   Consent: Patient/Guardian gives verbal consent for treatment and assignment of benefits for services provided during this visit. Patient/Guardian expressed understanding and agreed to proceed.    [name], MD 01/05/2022, 3:16 PM

## 2022-01-31 ENCOUNTER — Encounter (HOSPITAL_COMMUNITY): Payer: Self-pay | Admitting: Psychiatry

## 2022-01-31 ENCOUNTER — Telehealth (INDEPENDENT_AMBULATORY_CARE_PROVIDER_SITE_OTHER): Payer: Self-pay | Admitting: Psychiatry

## 2022-01-31 DIAGNOSIS — F410 Panic disorder [episodic paroxysmal anxiety] without agoraphobia: Secondary | ICD-10-CM

## 2022-01-31 DIAGNOSIS — F411 Generalized anxiety disorder: Secondary | ICD-10-CM

## 2022-01-31 DIAGNOSIS — F332 Major depressive disorder, recurrent severe without psychotic features: Secondary | ICD-10-CM

## 2022-01-31 MED ORDER — MIRTAZAPINE 30 MG PO TABS: 30.0000 mg | ORAL_TABLET | Freq: Every day | ORAL | 4 refills | Status: DC

## 2022-01-31 NOTE — Progress Notes (Signed)
Virtual Visit via Video Note  I connected with [name] on 01/31/22 at  1:00 PM EDT by a video enabled telemedicine application and verified that I am speaking with the correct person using two identifiers.  Location: Patient: home Provider: office   I discussed the limitations of evaluation and management by telemedicine and the availability of in person appointments. The patient expressed understanding and agreed to proceed.     I discussed the assessment and treatment plan with the patient. The patient was provided an opportunity to ask questions and all were answered. The patient agreed with the plan and demonstrated an understanding of the instructions.   The patient was advised to call back or seek an in-person evaluation if the symptoms worsen or if the condition fails to improve as anticipated.  I provided 15 minutes of non-face-to-face time during this encounter.   Susan Ruder, MD  [facility] MD/PA/NP OP Progress Note  01/31/2022 1:21 PM [name]  MRN:  [id number]  Chief Complaint:  Chief Complaint  Patient presents with   Depression   Anxiety   Follow-up   HPI: This patient is a 56 year old divorced white female who lives alone in [place].  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  The patient returns for follow-up after 4 weeks.  Prior to her last visit she had been really struggling at work.  She felt like she was being harassed by coworkers and told she was too slow etc.  They were supposed to make some accommodations for her at work which she claims they never made.  She therefore resigned on October 5.  Since then she has been looking for other jobs and has had some interviews.  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  Her mood is generally been good and she denies significant depression.  She is only had to use the Xanax a few times for anxiety.  She is sleeping and eating well and denies thoughts of  self-harm or suicide. Visit Diagnosis:    ICD-10-CM   1. MDD (major depressive disorder), recurrent severe, without psychosis ([facility])  F33.2     2. Generalized anxiety disorder with panic attacks  F41.1    F41.0       Past Psychiatric History: The patient was seeing Dr. Kieth Brightly for therapy and myself for medication management in the past.  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   IBS (irritable bowel syndrome)    Seizures ([facility]) 10/23/14   x 2   Shingles 11/12/14   left eye    Past Surgical History:  Procedure Laterality Date   ABDOMINAL HYSTERECTOMY     partial 2010-Dr.Ferguson   I & D EXTREMITY Left 07/17/2014   Procedure: IRRIGATION AND DEBRIDEMENT left index finger;  Surgeon: Betha Loa, MD;  Location: [facility];  Service: Orthopedics;  Laterality: Left;   NERVE, TENDON AND ARTERY REPAIR Left 07/17/2014   Procedure: NERVE, TENDON AND ARTERY REPAIR LEFT INDEX FINGER;  Surgeon: Betha Loa, MD;  Location: [facility];  Service: Orthopedics;  Laterality: Left;   PARTIAL HYSTERECTOMY  2006   TUBAL LIGATION  1988    Family Psychiatric History: See below  Family  History:  Family History  Problem Relation Age of Onset   Hypertension Sister    Depression Sister    Drug abuse Sister    Hypertension Mother    Osteoporosis Mother    Diabetes Mother    Alcohol abuse Mother    Depression Mother    Heart disease Father        MI   Depression Father    Depression Sister    Multiple sclerosis Sister    Heart attack Sister    Cancer - Lung Maternal Aunt    Leukemia Maternal Aunt    Colon cancer Neg Hx     Social History:  Social History   Socioeconomic History   Marital status: Divorced    Spouse name: Herbie [place]   Number of children: 1   Years of education:  16   Highest education level: Not on file  Occupational History   Occupation: home, yard maintenance    Comment: self employed  Tobacco Use   Smoking status: Every Day    Packs/day: 0.50    Years: 30.00    Total pack years: 15.00    Types: Cigarettes   Smokeless tobacco: Never  Vaping Use   Vaping Use: Never used  Substance and Sexual Activity   Alcohol use: Yes    Comment: occasionally   Drug use: Not Currently    Types: Marijuana   Sexual activity: Yes    Birth control/protection: Surgical    Comment: tubal and hyst  Other Topics Concern   Not on file  Social History Narrative   Lives at home with husband   Caffeine use- coffee, sodas all day   Social Determinants of Health   Financial Resource Strain: Not on file  Food Insecurity: Not on file  Transportation Needs: Not on file  Physical Activity: Not on file  Stress: Not on file  Social Connections: Not on file    Allergies:  Allergies  Allergen Reactions   Cephalexin    Effexor [Venlafaxine Hcl] Other (See Comments)    Dizziness, pain behind eye   Ampicillin Rash   Neomycin Rash   Penicillins Rash    Did it involve swelling of the face/tongue/throat, SOB, or low BP? Unknown Did it involve sudden or severe rash/hives, skin peeling, or any reaction on the inside of your mouth or nose? Unknown Did you need to seek medical attention at a hospital or doctor's office? Unknown When did it last happen?       If all above answers are "NO", may proceed with cephalosporin use.     Metabolic Disorder Labs: Lab Results  Component Value Date   HGBA1C 5.4 10/17/2018   MPG 108 10/17/2018   No results found for: "PROLACTIN" Lab Results  Component Value Date   CHOL 213 (H) 10/17/2018   TRIG 86 10/17/2018   HDL 41 10/17/2018   CHOLHDL 5.2 10/17/2018   VLDL 17 10/17/2018   LDLCALC 155 (H) 10/17/2018   LDLCALC 102 (H) 06/01/2017   Lab Results  Component Value Date   TSH 2.255 10/17/2018   TSH 1.947 07/22/2018     Therapeutic Level Labs: No results found for: "LITHIUM" No results found for: "VALPROATE" No results found for: "CBMZ"  Current Medications: Current Outpatient Medications  Medication Sig Dispense Refill   ALPRAZolam (XANAX) 0.5 MG tablet Take 1 tablet (0.5 mg total) by mouth 2 (two) times daily as needed for anxiety. 60 tablet 2   levETIRAcetam (KEPPRA) 750 MG tablet Take 1 tablet (  750 mg total) by mouth 2 (two) times daily. 60 tablet 12   mirtazapine (REMERON) 30 MG tablet Take 1 tablet (30 mg total) by mouth at bedtime. 30 tablet 4   Multiple Vitamin (MULTIVITAMIN) tablet Take 1 tablet by mouth daily.     No current facility-administered medications for this visit.     Musculoskeletal: Strength & Muscle Tone: within normal limits Gait & Station: normal Patient leans: N/A  Psychiatric Specialty Exam: Review of Systems  Psychiatric/Behavioral:  The patient is nervous/anxious.   All other systems reviewed and are negative.   There were no vitals taken for this visit.There is no height or weight on file to calculate BMI.  General Appearance: Casual and Fairly Groomed  Eye Contact:  Good  Speech:  Clear and Coherent  Volume:  Normal  Mood:  Euthymic  Affect:  Congruent  Thought Process:  Goal Directed  Orientation:  Full (Time, Place, and Person)  Thought Content: WDL   Suicidal Thoughts:  No  Homicidal Thoughts:  No  Memory:  Immediate;   Good Recent;   Good Remote;   Good  Judgement:  Good  Insight:  Fair  Psychomotor Activity:  Normal  Concentration:  Concentration: Good and Attention Span: Good  Recall:  Good  Fund of Knowledge: Good  Language: Good  Akathisia:  No  Handed:  Right  AIMS (if indicated): not done  Assets:  Communication Skills Desire for Improvement Physical Health Resilience Social Support Talents/Skills  ADL's:  Intact  Cognition: WNL  Sleep:  Good   Screenings: AIMS    Flowsheet Row [facility]  AIMS Total Score 0      AUDIT    Flowsheet Row [facility]  Alcohol Use Disorder Identification Test Final Score (AUDIT) 0      GAD-7    Flowsheet Row Office Visit from 04/21/2021 in [facility] Office Visit from 01/06/2021 in [facility] Office Visit from 02/12/2018 in [facility]  Total GAD-7 Score 4 9 4       PHQ2-9    Flowsheet Row [facility] Video Visit from [facility]2021 [facility] Office Visit from 10/22/2021 in [facility] Video Visit from 10/06/2021 [facility] Video Visit from 06/08/2021 [facility]  PHQ-2 Total Score 2 2 2 1 1   PHQ-9 Total Score 6 4 4  -- --      Flowsheet Row Video Visit from 01/05/2022 [facility] Video Visit from 12/15/2021 [facility] Video Visit from 10/06/2021 [facility]  C-SSRS RISK CATEGORY No Risk No Risk No Risk        Assessment and Plan: This patient is a 56 year old female with a history of depression anxiety 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  She is feeling much better now that she quit her stressful job.  She is looking for other work and is trying to stay busy.  She seems to be doing well on her current regimen so she will continue mirtazapine 30 mg at bedtime for sleep and depression and Xanax 0.5 mg twice daily only as needed for anxiety.  She will return to see me in 2 months  Collaboration of Care: Collaboration of Care: Primary Care Provider AEB notes  are shared with PCP through the epic  system  Patient/Guardian was advised Release of Information must be obtained prior to any record release in order to collaborate their care with an outside provider. Patient/Guardian was advised if they have not already done so to contact the registration department to sign all necessary forms in order for [place] to release information regarding their care.   Consent: Patient/Guardian gives verbal consent for treatment and assignment of benefits for services provided during this visit. Patient/Guardian expressed understanding and agreed to proceed.    Susan Ruder, MD 01/31/2022, 1:21 PM

## 2022-04-01 ENCOUNTER — Telehealth (HOSPITAL_COMMUNITY): Payer: Self-pay | Admitting: Psychiatry

## 2022-06-10 ENCOUNTER — Other Ambulatory Visit: Payer: Self-pay | Admitting: Family Medicine

## 2022-06-10 DIAGNOSIS — Z1231 Encounter for screening mammogram for malignant neoplasm of breast: Secondary | ICD-10-CM

## 2022-06-30 ENCOUNTER — Encounter: Payer: Self-pay | Admitting: Family Medicine

## 2022-06-30 ENCOUNTER — Telehealth: Payer: Self-pay

## 2022-06-30 DIAGNOSIS — Z13220 Encounter for screening for lipoid disorders: Secondary | ICD-10-CM

## 2022-06-30 DIAGNOSIS — Z79899 Other long term (current) drug therapy: Secondary | ICD-10-CM

## 2022-06-30 NOTE — Telephone Encounter (Signed)
Pt has appt on April 11 to check cholesterol needs lab work ordered   [employer] back [phone]

## 2022-06-30 NOTE — Telephone Encounter (Signed)
I agree thank you!

## 2022-06-30 NOTE — Telephone Encounter (Signed)
Patient called and stated she has a UTI. Patient has been informed that we do not have any appointments this week. Patient has been advised to go to urgent care. Patient verbalized understanding.

## 2022-07-01 NOTE — Telephone Encounter (Signed)
Patient called and informed labs have been ordered.

## 2022-07-01 NOTE — Telephone Encounter (Signed)
Spoke with patient yesterday.

## 2022-07-05 LAB — LIPID PANEL
Chol/HDL Ratio: 3.2 ratio (ref 0.0–4.4)
Cholesterol, Total: 198 mg/dL (ref 100–199)
HDL: 62 mg/dL (ref >39)
LDL Chol Calc (NIH): 127 mg/dL — ABNORMAL HIGH (ref 0–99)
Triglycerides: 51 mg/dL (ref 0–149)
VLDL Cholesterol Cal: 9 mg/dL (ref 5–40)

## 2022-07-05 LAB — BASIC METABOLIC PANEL
BUN/Creatinine Ratio: 18 (ref 9–23)
BUN: 16 mg/dL (ref 6–24)
CO2: 22 mmol/L (ref 20–29)
Calcium: 9.7 mg/dL (ref 8.7–10.2)
Chloride: 104 mmol/L (ref 96–106)
Creatinine, Ser: 0.91 mg/dL (ref 0.57–1.00)
Glucose: 100 mg/dL — ABNORMAL HIGH (ref 70–99)
Potassium: 4.5 mmol/L (ref 3.5–5.2)
Sodium: 141 mmol/L (ref 134–144)
eGFR: 74 mL/min/{1.73_m2} (ref >59)

## 2022-07-05 LAB — HEPATIC FUNCTION PANEL
ALT: 12 IU/L (ref 0–32)
AST: 17 IU/L (ref 0–40)
Albumin: 4.3 g/dL (ref 3.8–4.9)
Alkaline Phosphatase: 69 IU/L (ref 44–121)
Bilirubin Total: 0.5 mg/dL (ref 0.0–1.2)
Bilirubin, Direct: 0.11 mg/dL (ref 0.00–0.40)
Total Protein: 6.6 g/dL (ref 6.0–8.5)

## 2022-07-21 ENCOUNTER — Ambulatory Visit: Payer: Self-pay | Admitting: Family Medicine

## 2022-07-21 VITALS — BP 109/74 | HR 65 | Ht 67.0 in | Wt 126.2 lb

## 2022-07-21 DIAGNOSIS — Z720 Tobacco use: Secondary | ICD-10-CM

## 2022-07-21 DIAGNOSIS — B9689 Other specified bacterial agents as the cause of diseases classified elsewhere: Secondary | ICD-10-CM

## 2022-07-21 DIAGNOSIS — F338 Other recurrent depressive disorders: Secondary | ICD-10-CM

## 2022-07-21 DIAGNOSIS — N760 Acute vaginitis: Secondary | ICD-10-CM

## 2022-07-21 DIAGNOSIS — F332 Major depressive disorder, recurrent severe without psychotic features: Secondary | ICD-10-CM

## 2022-07-21 DIAGNOSIS — R569 Unspecified convulsions: Secondary | ICD-10-CM

## 2022-07-21 MED ORDER — LEVETIRACETAM 750 MG PO TABS: 750.0000 mg | ORAL_TABLET | Freq: Two times a day (BID) | ORAL | 12 refills | Status: DC

## 2022-07-21 MED ORDER — MIRTAZAPINE 15 MG PO TABS: 15.0000 mg | ORAL_TABLET | Freq: Every day | ORAL | 5 refills | Status: DC

## 2022-07-21 MED ORDER — METRONIDAZOLE 500 MG PO TABS: 500.0000 mg | ORAL_TABLET | Freq: Three times a day (TID) | ORAL | 0 refills | Status: AC

## 2022-07-21 MED ORDER — BUPROPION HCL ER (SR) 150 MG PO TB12: 150.0000 mg | ORAL_TABLET | Freq: Two times a day (BID) | ORAL | 2 refills | Status: DC

## 2022-07-21 NOTE — Progress Notes (Addendum)
   Subjective:    Patient ID: [name], female    DOB: [birth year], 57 y.o.   MRN: [id number]  HPI Patient arrives today to follow up lab work. Patient states she had to go urgent care for vaginal infection. Patient states she is still having odor and white- yellow discharge. She also relates that she has not had any seizures she takes her medicines  In addition to this she states her moods are doing well she does not want to go back to Dr. Tenny Craw she would like to continue on her Remeron but at a lower dose  The 10-year ASCVD risk score (Arnett DK, et al., 2019) is: 3.7%   Values used to calculate the score:     Age: 82 years     Sex: Female     Is Non-Hispanic African American: No     Diabetic: No     Tobacco smoker: Yes     Systolic Blood Pressure: 109 mmHg     Is BP treated: No     HDL Cholesterol: 62 mg/dL     Total Cholesterol: 198 mg/dL   Review of Systems     Objective:   Physical Exam  General-in no acute distress Eyes-no discharge Lungs-respiratory rate normal, CTA CV-no murmurs,RRR Extremities skin warm dry no edema Neuro grossly normal Behavior normal, alert       Assessment & Plan:  Bacterial vaginitis-offered patient gynecology referral versus pelvic exam versus additional medicine she chooses additional medicine.  Recommend metronidazole 3 times daily for 7 days  Depression she feels Remeron doing well but she would like to go to a lower dose because she feels her moods are doing well she does not want to go back to see Dr. Tenny Craw and currently does not want to go see any additional psychiatry we will reduce Remeron to 15 mg each evening follow-up again within several months  Seizures she has not had any I encouraged her to continue the medication as prescribed  Patient continues to smoke she is interested in quitting she would like to try Wellbutrin there is no drug interaction with Wellbutrin with what she is on this will be prescribed for the next 3  months  Encourage patient to sign onto expanded Medicaid but she does not trust the government She was encouraged to follow-up within 4 to 6 months She was also told to notify us if any seizures Also told that should her moods worsen to follow-up immediately And also we would be more than willing to refer her to psychiatry if she wanted the referral but currently she does not

## 2022-07-22 NOTE — Addendum Note (Signed)
Addended by: Elizbeth Squires on: 07/22/2022 10:35 AM   Modules accepted: Orders

## 2022-08-01 ENCOUNTER — Other Ambulatory Visit: Payer: Self-pay

## 2022-08-01 DIAGNOSIS — N760 Acute vaginitis: Secondary | ICD-10-CM

## 2022-08-01 LAB — IFOBT (OCCULT BLOOD): IFOBT: POSITIVE

## 2022-08-28 ENCOUNTER — Encounter: Payer: Self-pay | Admitting: Family Medicine

## 2022-08-28 NOTE — Progress Notes (Signed)
Patient not responding to MyChart messages Please send this as certified mail

## 2022-08-29 ENCOUNTER — Other Ambulatory Visit: Payer: Self-pay

## 2022-08-29 ENCOUNTER — Telehealth: Payer: Self-pay

## 2022-08-29 DIAGNOSIS — K921 Melena: Secondary | ICD-10-CM

## 2022-08-29 NOTE — Telephone Encounter (Signed)
Pt is calling back she has talk to a nurse about referral for gastro and there is not referral put in yet she want to go to the gastro Dr across from [facility] [phone]

## 2022-09-07 ENCOUNTER — Ambulatory Visit (INDEPENDENT_AMBULATORY_CARE_PROVIDER_SITE_OTHER): Payer: Self-pay | Admitting: Gastroenterology

## 2022-09-07 ENCOUNTER — Encounter: Payer: Self-pay | Admitting: Gastroenterology

## 2022-09-07 VITALS — BP 110/77 | HR 73 | Temp 97.9°F | Ht 67.0 in | Wt 123.6 lb

## 2022-09-07 DIAGNOSIS — R195 Other fecal abnormalities: Secondary | ICD-10-CM

## 2022-09-07 NOTE — Patient Instructions (Signed)
We have recommended a colonoscopy for you today based on prior stool test detecting blood in the stool. As discussed, based on your request we will offer you a repeat stool test and blood test to look for anemia. I will have someone reach out to you with stool test price. Let us know how you decide to proceed.

## 2022-09-07 NOTE — Progress Notes (Signed)
GI Office Note    Referring Provider: Babs Sciara, MD Primary Care Physician:  Babs Sciara, MD  Primary Gastroenterologist: Hennie Duos. Marletta Lor, DO   Chief Complaint   Chief Complaint  Patient presents with   Blood In Stools    Fecal test was positive for blood in stool     History of Present Illness   [name] is a 57 y.o. female presenting today at the request of Dr. Gerda Diss for further evaluation of Hemoccult positive stool.  Patient has never had a colonoscopy.  She did not complete Cologuard last year as recommended by her PCP.  Recently did complete iFOBT but she feels like it was positive because of hemorrhoid issues.  She chooses to be uninsured.  In the past she has received [facility] patient assistance but currently not interested in pursuing.  Reports that she has daily bowel movements.  Sometimes if her hemorrhoid is flaring, she may see some bright red blood per rectum on the toilet tissue.  She denies any abdominal pain, constipation, diarrhea, heartburn, dysphagia, nausea or vomiting, unintentional weight loss.  Sometimes she will have some issues with her bowels if she does not eat well.   Medications   Current Outpatient Medications  Medication Sig Dispense Refill   ALPRAZolam (XANAX) 0.5 MG tablet Take 1 tablet (0.5 mg total) by mouth 2 (two) times daily as needed for anxiety. 60 tablet 2   levETIRAcetam (KEPPRA) 750 MG tablet Take 1 tablet (750 mg total) by mouth 2 (two) times daily. 60 tablet 12   mirtazapine (REMERON) 15 MG tablet Take 1 tablet (15 mg total) by mouth at bedtime. 30 tablet 5   No current facility-administered medications for this visit.    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   IBS (irritable bowel syndrome)    Seizures (HCC) 10/23/14   x 2   Shingles 11/12/14   left eye    Past Surgical History   Past Surgical History:  Procedure Laterality Date   ABDOMINAL HYSTERECTOMY     partial 2010-Dr.Ferguson   I & D EXTREMITY Left 07/17/2014   Procedure: IRRIGATION AND DEBRIDEMENT left index finger;  Surgeon: Betha Loa, MD;  Location: [facility] OR;  Service: Orthopedics;  Laterality: Left;   NERVE, TENDON AND ARTERY REPAIR Left 07/17/2014   Procedure: NERVE, TENDON AND ARTERY REPAIR LEFT INDEX FINGER;  Surgeon: Betha Loa, MD;  Location: [facility] OR;  Service: Orthopedics;  Laterality: Left;   PARTIAL HYSTERECTOMY  2006   TUBAL LIGATION  1988    Past Family History   Family History  Problem Relation Age of Onset   Hypertension Sister    Depression Sister    Drug abuse Sister    Hypertension Mother    Osteoporosis Mother    Diabetes Mother    Alcohol abuse Mother    Depression Mother    Heart disease Father        MI   Depression Father    Depression Sister  Multiple sclerosis Sister    Heart attack Sister    Cancer - Lung Maternal Aunt    Leukemia Maternal Aunt    Colon cancer Neg Hx     Past Social History   Social History   Socioeconomic History   Marital status: Divorced    Spouse name: [name]   Number of children: 1   Years of education: 16   Highest education level: Not on file  Occupational History   Occupation: home, yard maintenance    Comment: self employed  Tobacco Use   Smoking status: Every Day    Packs/day: 0.50    Years: 30.00    Additional pack years: 0.00    Total pack years: 15.00    Types: Cigarettes   Smokeless tobacco: Never  Vaping Use   Vaping Use: Never used  Substance and Sexual Activity   Alcohol use: Yes    Comment:  occasionally   Drug use: Not Currently    Types: Marijuana   Sexual activity: Yes    Birth control/protection: Surgical    Comment: tubal and hyst  Other Topics Concern   Not on file  Social History Narrative   Lives at home with husband   Caffeine use- coffee, sodas all day   Social Determinants of Health   Financial Resource Strain: Patient Declined (07/18/2022)   Overall Financial Resource Strain (CARDIA)    Difficulty of Paying Living Expenses: Patient declined  Food Insecurity: Patient Declined (07/18/2022)   Hunger Vital Sign    Worried About Running Out of Food in the Last Year: Patient declined    Ran Out of Food in the Last Year: Patient declined  Transportation Needs: Patient Declined (07/18/2022)   PRAPARE - Administrator, Civil Service (Medical): Patient declined    Lack of Transportation (Non-Medical): Patient declined  Physical Activity: Unknown (07/18/2022)   Exercise Vital Sign    Days of Exercise per Week: 5 days    Minutes of Exercise per Session: Patient declined  Stress: Patient Declined (07/18/2022)   [employer] of Occupational Health - Occupational Stress Questionnaire    Feeling of Stress : Patient declined  Social Connections: Unknown (07/18/2022)   Social Connection and Isolation Panel [NHANES]    Frequency of Communication with Friends and Family: Patient declined    Frequency of Social Gatherings with Friends and Family: Patient declined    Attends Religious Services: Patient declined    Database administrator or Organizations: Patient declined    Attends Engineer, structural: Not on file    Marital Status: Patient declined  Intimate Partner Violence: Not on file    Review of Systems   General: Negative for anorexia, weight loss, fever, chills, fatigue, weakness. Eyes: Negative for vision changes.  ENT: Negative for hoarseness, difficulty swallowing , nasal congestion. CV: Negative for chest pain, angina, palpitations, dyspnea  on exertion, peripheral edema.  Respiratory: Negative for dyspnea at rest, dyspnea on exertion, cough, sputum, wheezing.  GI: See history of present illness. GU:  Negative for dysuria, hematuria, urinary incontinence, urinary frequency, nocturnal urination.  MS: Negative for joint pain, low back pain.  Derm: Negative for rash or itching.  Neuro: Negative for weakness, abnormal sensation, seizure, frequent headaches, memory loss,  confusion.  Psych: Negative for anxiety, depression, suicidal ideation, hallucinations.  Endo: Negative for unusual weight change.  Heme: Negative for bruising or bleeding. Allergy: Negative for rash or hives.  Physical Exam   BP 110/77 (BP Location: Right Arm,  Patient Position: Sitting, Cuff Size: Normal)   Pulse 73   Temp 97.9 F (36.6 C) (Oral)   Ht 5\' 7"  (1.702 m)   Wt 123 lb 9.6 oz (56.1 kg)   SpO2 98%   BMI 19.36 kg/m    General: Well-nourished, well-developed in no acute distress.  Head: Normocephalic, atraumatic.   Eyes: Conjunctiva pink, no icterus. Mouth: Oropharyngeal mucosa moist and pink  Neck: Supple without thyromegaly, masses, or lymphadenopathy.  Lungs: Clear to auscultation bilaterally.  Heart: Regular rate and rhythm, no murmurs rubs or gallops.  Abdomen: Bowel sounds are normal, nontender, nondistended, no hepatosplenomegaly or masses,  no abdominal bruits or hernia, no rebound or guarding.   Rectal: not performed Extremities: No lower extremity edema. No clubbing or deformities.  Neuro: Alert and oriented x 4 , grossly normal neurologically.  Skin: Warm and dry, no rash or jaundice.   Psych: Alert and cooperative, normal mood and affect.  Labs   Lab Results  Component Value Date   CREATININE 0.91 07/04/2022   BUN 16 07/04/2022   NA 141 07/04/2022   K 4.5 07/04/2022   CL 104 07/04/2022   CO2 22 07/04/2022   Lab Results  Component Value Date   ALT 12 07/04/2022   AST 17 07/04/2022   ALKPHOS 69 07/04/2022   BILITOT  0.5 07/04/2022      Imaging Studies   No results found.  Assessment/Plan:   Hemoccult positive stool: I have recommended a colonoscopy.  At this time patient is not interested.  Last available hemoglobin in July 2020 was normal at 13.1.  She believes her stool was Hemoccult positive because of flare of her hemorrhoids.  She request repeat test.  Explained to patient that this would not change recommendations however we would offer her repeat iFOBT if she desires.  She is aware that there will be a charge, we will let her know what that price is before she decides to pursue.  Would encourage her to check CBC to make sure there is no anemia which would also be another indication for her to proceed with colonoscopy.  She will contact [facility] to find out the price before deciding to proceed.  Requested that she let us know how she plans to proceed.      Leanna Battles. Melvyn Neth, MHS, PA-C [facility]

## 2022-09-08 ENCOUNTER — Encounter: Payer: Self-pay | Admitting: Gastroenterology

## 2022-09-23 LAB — CBC
Hematocrit: 40.3 % (ref 34.0–46.6)
Hemoglobin: 13.3 g/dL (ref 11.1–15.9)
MCH: 32 pg (ref 26.6–33.0)
MCHC: 33 g/dL (ref 31.5–35.7)
MCV: 97 fL (ref 79–97)
Platelets: 264 10*3/uL (ref 150–450)
RBC: 4.16 x10E6/uL (ref 3.77–5.28)
RDW: 11.9 % (ref 11.7–15.4)
WBC: 9 10*3/uL (ref 3.4–10.8)

## 2022-10-05 ENCOUNTER — Other Ambulatory Visit: Payer: Self-pay

## 2022-10-05 ENCOUNTER — Ambulatory Visit (INDEPENDENT_AMBULATORY_CARE_PROVIDER_SITE_OTHER): Payer: Self-pay | Admitting: Gastroenterology

## 2022-10-05 DIAGNOSIS — R195 Other fecal abnormalities: Secondary | ICD-10-CM

## 2022-10-05 LAB — IFOBT (OCCULT BLOOD): IFOBT: NEGATIVE

## 2022-10-19 ENCOUNTER — Telehealth: Payer: Self-pay

## 2022-10-19 NOTE — Telephone Encounter (Signed)
Left detailed message that provider's schedule for this month is booked and we will call once we get his August schedule. Any questions or concerns to call back.

## 2022-10-19 NOTE — Telephone Encounter (Signed)
Ok to schedule colonoscopy with Dr. Marletta Lor. ASA 2. Dx Rectal bleeding.

## 2022-10-19 NOTE — Telephone Encounter (Signed)
Patient called stating that she had blood in her stool yesterday and today and has decided that she would like to move forward with scheduling a colonoscopy.

## 2022-10-27 ENCOUNTER — Encounter: Payer: Self-pay | Admitting: *Deleted

## 2022-10-27 ENCOUNTER — Other Ambulatory Visit: Payer: Self-pay | Admitting: *Deleted

## 2022-10-27 MED ORDER — PEG 3350-KCL-NA BICARB-NACL 420 G PO SOLR: 4000.0000 mL | Freq: Once | ORAL | 0 refills | Status: AC

## 2022-10-27 NOTE — Telephone Encounter (Signed)
LMOVM to call back to schedule 

## 2022-10-27 NOTE — Telephone Encounter (Signed)
Pt left vm returning call  LMTRC 

## 2022-11-18 ENCOUNTER — Encounter (HOSPITAL_COMMUNITY)
Admission: RE | Disposition: A | Discharge status: Home / Self Care | Payer: Self-pay | Source: Home / Self Care | Attending: Internal Medicine

## 2022-11-18 ENCOUNTER — Ambulatory Visit (HOSPITAL_BASED_OUTPATIENT_CLINIC_OR_DEPARTMENT_OTHER): Payer: Self-pay | Admitting: Anesthesiology

## 2022-11-18 ENCOUNTER — Ambulatory Visit (HOSPITAL_COMMUNITY)
Admission: RE | Admit: 2022-11-18 | Discharge: 2022-11-18 | Disposition: A | Discharge status: Home / Self Care | Payer: Self-pay | Attending: Internal Medicine | Admitting: Internal Medicine

## 2022-11-18 ENCOUNTER — Other Ambulatory Visit: Payer: Self-pay

## 2022-11-18 ENCOUNTER — Ambulatory Visit (HOSPITAL_COMMUNITY): Payer: Self-pay | Admitting: Anesthesiology

## 2022-11-18 ENCOUNTER — Encounter (HOSPITAL_COMMUNITY): Payer: Self-pay

## 2022-11-18 DIAGNOSIS — D125 Benign neoplasm of sigmoid colon: Secondary | ICD-10-CM | POA: Insufficient documentation

## 2022-11-18 DIAGNOSIS — R195 Other fecal abnormalities: Secondary | ICD-10-CM | POA: Insufficient documentation

## 2022-11-18 DIAGNOSIS — Z1211 Encounter for screening for malignant neoplasm of colon: Secondary | ICD-10-CM | POA: Insufficient documentation

## 2022-11-18 DIAGNOSIS — D126 Benign neoplasm of colon, unspecified: Secondary | ICD-10-CM

## 2022-11-18 DIAGNOSIS — J449 Chronic obstructive pulmonary disease, unspecified: Secondary | ICD-10-CM | POA: Insufficient documentation

## 2022-11-18 DIAGNOSIS — F17210 Nicotine dependence, cigarettes, uncomplicated: Secondary | ICD-10-CM | POA: Insufficient documentation

## 2022-11-18 DIAGNOSIS — K644 Residual hemorrhoidal skin tags: Secondary | ICD-10-CM | POA: Insufficient documentation

## 2022-11-18 DIAGNOSIS — R569 Unspecified convulsions: Secondary | ICD-10-CM | POA: Insufficient documentation

## 2022-11-18 DIAGNOSIS — F419 Anxiety disorder, unspecified: Secondary | ICD-10-CM | POA: Insufficient documentation

## 2022-11-18 DIAGNOSIS — K625 Hemorrhage of anus and rectum: Secondary | ICD-10-CM | POA: Insufficient documentation

## 2022-11-18 DIAGNOSIS — K5730 Diverticulosis of large intestine without perforation or abscess without bleeding: Secondary | ICD-10-CM | POA: Insufficient documentation

## 2022-11-18 DIAGNOSIS — D123 Benign neoplasm of transverse colon: Secondary | ICD-10-CM | POA: Insufficient documentation

## 2022-11-18 HISTORY — PX: POLYPECTOMY: SHX5525

## 2022-11-18 HISTORY — PX: COLONOSCOPY WITH PROPOFOL: SHX5780

## 2022-11-18 SURGERY — COLONOSCOPY WITH PROPOFOL
Anesthesia: General

## 2022-11-18 MED ORDER — PROPOFOL 500 MG/50ML IV EMUL
INTRAVENOUS | Status: DC | PRN
  Administered 2022-11-18: 150 ug/kg/min via INTRAVENOUS

## 2022-11-18 MED ORDER — LACTATED RINGERS IV SOLN: INTRAVENOUS | Status: DC

## 2022-11-18 MED ORDER — PHENYLEPHRINE 80 MCG/ML (10ML) SYRINGE FOR IV PUSH (FOR BLOOD PRESSURE SUPPORT)
PREFILLED_SYRINGE | INTRAVENOUS | Status: DC | PRN
  Administered 2022-11-18: 80 ug via INTRAVENOUS

## 2022-11-18 MED ORDER — PROPOFOL 500 MG/50ML IV EMUL
INTRAVENOUS | Status: AC
  Filled 2022-11-18: qty 50

## 2022-11-18 MED ORDER — PROPOFOL 10 MG/ML IV BOLUS
INTRAVENOUS | Status: DC | PRN
Start: 2022-11-18 — End: 2022-11-18
  Administered 2022-11-18: 80 mg via INTRAVENOUS

## 2022-11-18 MED ORDER — STERILE WATER FOR IRRIGATION IR SOLN
Status: DC | PRN
  Administered 2022-11-18: 120 mL

## 2022-11-18 MED ORDER — LIDOCAINE HCL (CARDIAC) PF 100 MG/5ML IV SOSY
PREFILLED_SYRINGE | INTRAVENOUS | Status: DC | PRN
  Administered 2022-11-18: 80 mg via INTRAVENOUS

## 2022-11-18 NOTE — Anesthesia Postprocedure Evaluation (Signed)
Anesthesia Post Note  Patient: [name]  Procedure(s) Performed: COLONOSCOPY WITH PROPOFOL POLYPECTOMY  Patient location during evaluation: Phase II Anesthesia Type: General Level of consciousness: awake and alert and oriented Pain management: pain level controlled Vital Signs Assessment: post-procedure vital signs reviewed and stable Respiratory status: spontaneous breathing, nonlabored ventilation and respiratory function stable Cardiovascular status: blood pressure returned to baseline and stable Postop Assessment: no apparent nausea or vomiting Anesthetic complications: no  No notable events documented.   Last Vitals:  Vitals:   11/18/22 0707 11/18/22 0837  BP: 115/65 111/67  Pulse: (!) 55 61  Resp: 20 19  Temp: 37 C 36.5 C  SpO2: 99% 100%    Last Pain:  Vitals:   11/18/22 0837  TempSrc: Axillary  PainSc: 0-No pain                 Jaydynn Wolford C Keshawn Sundberg

## 2022-11-18 NOTE — Transfer of Care (Addendum)
Immediate Anesthesia Transfer of Care Note  Patient: [name]  Procedure(s) Performed: COLONOSCOPY WITH PROPOFOL POLYPECTOMY  Patient Location: PACU  Anesthesia Type:General  Level of Consciousness: drowsy and patient cooperative  Airway & Oxygen Therapy: Patient Spontanous Breathing and Patient connected to nasal cannula oxygen  Post-op Assessment: Report given to RN and Post -op Vital signs reviewed and stable  Post vital signs: Reviewed and stable  Last Vitals:  Vitals Value Taken Time  BP 111/67 11/18/22   0837  Temp 36.5 11/18/22   0837  Pulse 61 11/18/22   0837  Resp 19 11/18/22   0837  SpO2 100% 11/18/22   0837    Last Pain:  Vitals:   11/18/22 0813  TempSrc:   PainSc: 0-No pain      Patients Stated Pain Goal: 9 (11/18/22 0707)  Complications: No notable events documented.

## 2022-11-18 NOTE — Progress Notes (Signed)
Please excuse [name] from work on Saturday 11/19/2022.  She had a medical procedure requiring anesthesia.   Thank you  Virgie Dad RN [facility]

## 2022-11-18 NOTE — Anesthesia Preprocedure Evaluation (Addendum)
Anesthesia Evaluation  Patient identified by MRN, date of birth, ID band Patient awake    Reviewed: Allergy & Precautions, H&P , NPO status , Patient's Chart, lab work & pertinent test results  Airway Mallampati: III  TM Distance: >3 FB Neck ROM: Full    Dental  (+) Missing, Dental Advisory Given Crowns :   Pulmonary COPD, Current Smoker and Patient abstained from smoking.   Pulmonary exam normal breath sounds clear to auscultation       Cardiovascular negative cardio ROS Normal cardiovascular exam Rhythm:Regular Rate:Normal     Neuro/Psych Seizures -, Well Controlled,  PSYCHIATRIC DISORDERS Anxiety Depression     Neuromuscular disease (chiari malformation)    GI/Hepatic negative GI ROS,,,(+)     substance abuse  marijuana use  Endo/Other  negative endocrine ROS    Renal/GU negative Renal ROS  negative genitourinary   Musculoskeletal negative musculoskeletal ROS (+)    Abdominal   Peds negative pediatric ROS (+)  Hematology negative hematology ROS (+)   Anesthesia Other Findings Raynaud's phenomenon   Reproductive/Obstetrics negative OB ROS                             Anesthesia Physical Anesthesia Plan  ASA: 2  Anesthesia Plan: General   Post-op Pain Management: Minimal or no pain anticipated   Induction: Intravenous  PONV Risk Score and Plan: 1 and Propofol infusion  Airway Management Planned: Nasal Cannula and Natural Airway  Additional Equipment:   Intra-op Plan:   Post-operative Plan:   Informed Consent: I have reviewed the patients History and Physical, chart, labs and discussed the procedure including the risks, benefits and alternatives for the proposed anesthesia with the patient or authorized representative who has indicated his/her understanding and acceptance.     Dental advisory given  Plan Discussed with: CRNA and Surgeon  Anesthesia Plan Comments:         Anesthesia Quick Evaluation

## 2022-11-18 NOTE — Progress Notes (Signed)
Prep made patient sick. She only drank 1/2 gallon. Patient states she has yellow/orange liquid coming out.

## 2022-11-18 NOTE — H&P (Signed)
Primary Care Physician:  Babs Sciara, MD Primary Gastroenterologist:  Dr. Marletta Lor  Pre-Procedure History & Physical: HPI:  [name] is a 57 y.o. 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   IBS (irritable bowel syndrome)    Seizures (HCC) 10/23/14   x 2   Shingles 11/12/14   left eye    Past Surgical History:  Procedure Laterality Date   ABDOMINAL HYSTERECTOMY     partial 2010-Dr.Ferguson   I & D EXTREMITY Left 07/17/2014   Procedure: IRRIGATION AND DEBRIDEMENT left index finger;  Surgeon: Betha Loa, MD;  Location: [facility] OR;  Service: Orthopedics;  Laterality: Left;   NERVE, TENDON AND ARTERY REPAIR Left 07/17/2014   Procedure: NERVE, TENDON AND ARTERY REPAIR LEFT INDEX FINGER;  Surgeon: Betha Loa, MD;  Location: [facility] OR;  Service: Orthopedics;  Laterality: Left;   PARTIAL HYSTERECTOMY  2006   TUBAL LIGATION  1988    Prior to Admission medications   Medication Sig Start Date End Date Taking? Authorizing Provider  ALPRAZolam Prudy Feeler) 0.5 MG tablet Take 1 tablet (0.5 mg total) by mouth 2 (two) times daily as needed for anxiety. 01/05/22 01/05/23 Yes Myrlene Broker, MD  levETIRAcetam (KEPPRA) 750 MG tablet Take 1 tablet (750 mg total) by mouth 2 (two) times daily. 07/21/22  Yes Babs Sciara, MD  mirtazapine (REMERON) 15 MG tablet Take 1 tablet (15 mg total) by mouth at bedtime. 07/21/22  Yes Babs Sciara, MD    Allergies as of 10/27/2022 - Review Complete 09/07/2022  Allergen Reaction Noted   Cephalexin  10/23/2017   Effexor [venlafaxine hcl] Other (See Comments) 07/31/2012   Ampicillin Rash 01/21/2011   Neomycin Rash 06/26/2012   Penicillins Rash 07/31/2012    Family History  Problem Relation Age of Onset   Hypertension Sister    Depression Sister    Drug  abuse Sister    Hypertension Mother    Osteoporosis Mother    Diabetes Mother    Alcohol abuse Mother    Depression Mother    Heart disease Father        MI   Depression Father    Depression Sister    Multiple sclerosis Sister    Heart attack Sister    Cancer - Lung Maternal Aunt    Leukemia Maternal Aunt    Colon cancer Neg Hx     Social History   Socioeconomic History   Marital status: Divorced    Spouse name: [name]   Number of children: 1   Years of education: 16   Highest education level: Not on file  Occupational History   Occupation: home, yard maintenance    Comment: self employed  Tobacco Use   Smoking status: Every Day    Current packs/day: 0.50    Average packs/day: 0.5 packs/day for 30.0 years (15.0 ttl pk-yrs)    Types: Cigarettes   Smokeless tobacco: Never  Vaping Use   Vaping status: Never Used  Substance and Sexual Activity   Alcohol use: Yes    Comment: occasionally   Drug use: Not Currently    Types: Marijuana   Sexual activity: Yes    Birth control/protection: Surgical    Comment: tubal and hyst  Other Topics Concern  Not on file  Social History Narrative   Lives at home with husband   Caffeine use- coffee, sodas all day   Social Determinants of Health   Financial Resource Strain: Patient Declined (07/18/2022)   Overall Financial Resource Strain (CARDIA)    Difficulty of Paying Living Expenses: Patient declined  Food Insecurity: Patient Declined (07/18/2022)   Hunger Vital Sign    Worried About Running Out of Food in the Last Year: Patient declined    Ran Out of Food in the Last Year: Patient declined  Transportation Needs: Patient Declined (07/18/2022)   PRAPARE - Administrator, Civil Service (Medical): Patient declined    Lack of Transportation (Non-Medical): Patient declined  Physical Activity: Unknown (07/18/2022)   Exercise Vital Sign    Days of Exercise per Week: 5 days    Minutes of Exercise per Session: Patient declined   Stress: Patient Declined (07/18/2022)   [employer] of Occupational Health - Occupational Stress Questionnaire    Feeling of Stress : Patient declined  Social Connections: Unknown (07/18/2022)   Social Connection and Isolation Panel [NHANES]    Frequency of Communication with Friends and Family: Patient declined    Frequency of Social Gatherings with Friends and Family: Patient declined    Attends Religious Services: Patient declined    Database administrator or Organizations: Patient declined    Attends Engineer, structural: Not on file    Marital Status: Patient declined  Intimate Partner Violence: Not on file    Review of Systems: See HPI, otherwise negative ROS  Physical Exam: Vital signs in last 24 hours: Temp:  [98.6 F (37 C)] 98.6 F (37 C) (08/09 0707) Pulse Rate:  [55] 55 (08/09 0707) Resp:  [20] 20 (08/09 0707) BP: (115)/(65) 115/65 (08/09 0707) SpO2:  [99 %] 99 % (08/09 0707) Weight:  [56.7 kg] 56.7 kg (08/09 0707)   General:   Alert,  Well-developed, well-nourished, pleasant and cooperative in NAD Head:  Normocephalic and atraumatic. Eyes:  Sclera clear, no icterus.   Conjunctiva pink. Ears:  Normal auditory acuity. Nose:  No deformity, discharge,  or lesions. Msk:  Symmetrical without gross deformities. Normal posture. Extremities:  Without clubbing or edema. Neurologic:  Alert and  oriented x4;  grossly normal neurologically. Skin:  Intact without significant lesions or rashes. Psych:  Alert and cooperative. Normal mood and affect.  Impression/Plan: [name] is here for a colonoscopy to be performed for positive hemoccult   The risks of the procedure including infection, bleed, or perforation as well as benefits, limitations, alternatives and imponderables have been reviewed with the patient. Questions have been answered. All parties agreeable.

## 2022-11-18 NOTE — Discharge Instructions (Addendum)
  Colonoscopy Discharge Instructions  Read the instructions outlined below and refer to this sheet in the next few weeks. These discharge instructions provide you with general information on caring for yourself after you leave the hospital. Your doctor may also give you specific instructions. While your treatment has been planned according to the most current medical practices available, unavoidable complications occasionally occur.   ACTIVITY You may resume your regular activity, but move at a slower pace for the next 24 hours.  Take frequent rest periods for the next 24 hours.  Walking will help get rid of the air and reduce the bloated feeling in your belly (abdomen).  No driving for 24 hours (because of the medicine (anesthesia) used during the test).   Do not sign any important legal documents or operate any machinery for 24 hours (because of the anesthesia used during the test).  NUTRITION Drink plenty of fluids.  You may resume your normal diet as instructed by your doctor.  Begin with a light meal and progress to your normal diet. Heavy or fried foods are harder to digest and may make you feel sick to your stomach (nauseated).  Avoid alcoholic beverages for 24 hours or as instructed.  MEDICATIONS You may resume your normal medications unless your doctor tells you otherwise.  WHAT YOU CAN EXPECT TODAY Some feelings of bloating in the abdomen.  Passage of more gas than usual.  Spotting of blood in your stool or on the toilet paper.  IF YOU HAD POLYPS REMOVED DURING THE COLONOSCOPY: No aspirin products for 7 days or as instructed.  No alcohol for 7 days or as instructed.  Eat a soft diet for the next 24 hours.  FINDING OUT THE RESULTS OF YOUR TEST Not all test results are available during your visit. If your test results are not back during the visit, make an appointment with your caregiver to find out the results. Do not assume everything is normal if you have not heard from your  caregiver or the medical facility. It is important for you to follow up on all of your test results.  SEEK IMMEDIATE MEDICAL ATTENTION IF: You have more than a spotting of blood in your stool.  Your belly is swollen (abdominal distention).  You are nauseated or vomiting.  You have a temperature over 101.  You have abdominal pain or discomfort that is severe or gets worse throughout the day.   Your colonoscopy revealed 6 polyp(s) which I removed successfully. Await pathology results, my office will contact you. I recommend repeating colonoscopy in 3-5 years for surveillance purposes depending on pathology results.   You also have diverticulosis and internal hemorrhoids. I would recommend increasing fiber in your diet or adding OTC Benefiber/Metamucil. Be sure to drink at least 4 to 6 glasses of water daily. Follow-up with GI in 3 months. Office will call with appointment   I hope you have a great rest of your week!  Hennie Duos. Marletta Lor, D.O. Gastroenterology and Hepatology [facility]

## 2022-11-18 NOTE — Op Note (Signed)
[facility] Patient Name: [name] Procedure Date: 11/18/2022 8:01 AM MRN: [id number] Date of Birth: Feb 10, 1966 Attending MD: Hennie Duos. Marletta Lor , [place], [id number] CSN: [id number] Age: 57 Admit Type: Outpatient Procedure:                Colonoscopy Indications:              Screening for colorectal malignant neoplasm,                            Incidental - Heme positive stool Providers:                Hennie Duos. Marletta Lor, DO, Sheran Fava, Zena Amos Referring MD:              Medicines:                See the Anesthesia note for documentation of the                            administered medications Complications:            No immediate complications. Estimated Blood Loss:     Estimated blood loss was minimal. Procedure:                Pre-Anesthesia Assessment:                           - The anesthesia plan was to use monitored                            anesthesia care (MAC).                           After obtaining informed consent, the colonoscope                            was passed under direct vision. Throughout the                            procedure, the patient's blood pressure, pulse, and                            oxygen saturations were monitored continuously. The                            [id number]-HQ190L ([id number]) scope was introduced through                            the anus and advanced to the the cecum, identified                            by appendiceal orifice and ileocecal valve. The                            colonoscopy was performed without difficulty. The  patient tolerated the procedure well. The quality                            of the bowel preparation was evaluated using the                            BBPS [facility] Bowel Preparation Scale) with scores                            of: Right Colon = 3, Transverse Colon = 3 and Left                            Colon = 3 (entire mucosa seen  well with no residual                            staining, small fragments of stool or opaque                            liquid). The total BBPS score equals 9. Scope In: 8:16:02 AM Scope Out: 8:32:39 AM Scope Withdrawal Time: 0 hours 13 minutes 26 seconds  Total Procedure Duration: 0 hours 16 minutes 37 seconds  Findings:      Hemorrhoids were found on perianal exam.      Non-bleeding internal hemorrhoids were found during endoscopy.      A few medium-mouthed and small-mouthed diverticula were found in the       sigmoid colon.      Three sessile polyps were found in the transverse colon. The polyps were       5 to 7 mm in size. These polyps were removed with a cold snare.       Resection and retrieval were complete.      Three sessile polyps were found in the sigmoid colon. The polyps were 4       to 6 mm in size. These polyps were removed with a cold snare. Resection       and retrieval were complete.      The exam was otherwise without abnormality. Impression:               - Hemorrhoids found on perianal exam.                           - Non-bleeding internal hemorrhoids.                           - Diverticulosis in the sigmoid colon.                           - Three 5 to 7 mm polyps in the transverse colon,                            removed with a cold snare. Resected and retrieved.                           - Three 4 to 6 mm polyps in the sigmoid colon,  removed with a cold snare. Resected and retrieved.                           - The examination was otherwise normal. Moderate Sedation:      Per Anesthesia Care Recommendation:           - Patient has a contact number available for                            emergencies. The signs and symptoms of potential                            delayed complications were discussed with the                            patient. Return to normal activities tomorrow.                            Written discharge  instructions were provided to the                            patient.                           - Resume previous diet.                           - Continue present medications.                           - Await pathology results.                           - Repeat colonoscopy in 3 - 5 years for                            surveillance.                           - Return to GI clinic in 3 months. Procedure Code(s):        --- Professional ---                           [phone], Colonoscopy, flexible; with removal of                            tumor(s), polyp(s), or other lesion(s) by snare                            technique Diagnosis Code(s):        --- Professional ---                           Z12.11, Encounter for screening for malignant                            neoplasm of colon  K64.8, Other hemorrhoids                           D12.3, Benign neoplasm of transverse colon (hepatic                            flexure or splenic flexure)                           D12.5, Benign neoplasm of sigmoid colon                           K57.30, Diverticulosis of large intestine without                            perforation or abscess without bleeding CPT copyright 2022 American Medical Association. All rights reserved. The codes documented in this report are preliminary and upon coder review may  be revised to meet current compliance requirements. Hennie Duos. Marletta Lor, DO Hennie Duos. Marletta Lor, DO 11/18/2022 8:36:22 AM This report has been signed electronically. Number of Addenda: 0

## 2022-11-22 ENCOUNTER — Encounter (HOSPITAL_COMMUNITY): Payer: Self-pay | Admitting: Internal Medicine

## 2022-11-24 ENCOUNTER — Ambulatory Visit (INDEPENDENT_AMBULATORY_CARE_PROVIDER_SITE_OTHER): Payer: Self-pay | Admitting: Family Medicine

## 2022-11-24 ENCOUNTER — Encounter: Payer: Self-pay | Admitting: Family Medicine

## 2022-11-24 DIAGNOSIS — R569 Unspecified convulsions: Secondary | ICD-10-CM

## 2022-11-24 MED ORDER — LEVETIRACETAM 500 MG PO TABS
500.0000 mg | ORAL_TABLET | Freq: Two times a day (BID) | ORAL | 6 refills | Status: DC
Start: 2022-11-24 — End: 2023-06-01

## 2022-11-24 NOTE — Progress Notes (Signed)
   Subjective:    Patient ID: [name], female    DOB: 1965/08/16, 57 y.o.   MRN: [id number]  HPI  Discuss supplements  She would like to discuss today different supplements She has a vitamin that is supposed to be for bone health She has prebiotic's and probiotics She states she has IBS symptoms and states that these medications tend to help she wanted to make sure that they are safe to use  Review of Systems     Objective:   Physical Exam She has not had a seizure since 2020 She admits that she is taking her seizure medicine 750 mg 1 daily rather than twice daily She felt that the 1500 mg was too much She was on 500 mg twice a day and she states she did fine with that Because she has been on 750 mg over the past year and has not had a seizure I did discuss with her how taking it once a day and leaves her without coverage on the other part of the day it would be best for her to shift to 500 mg twice daily we made a change in her prescription today follow-up by springtime   Respiratory rate is normal neurologic is grossly normal    Assessment & Plan:   I reviewed over these I would recommend that she save her money in regards to prebiotic's If she does decide to take a probiotic I think that would be fine In addition to this as for the vitamin D for bone health I advised her not to take that because it has vitamin K in it which could increase risk of blood clots  We did discuss the importance of doing a mammogram she defers Patient encouraged to quit smoking.  Follow-up here as needed and at least yearly

## 2023-01-23 ENCOUNTER — Ambulatory Visit: Payer: Self-pay | Admitting: Family Medicine

## 2023-01-23 ENCOUNTER — Encounter: Payer: Self-pay | Admitting: Gastroenterology

## 2023-01-26 ENCOUNTER — Ambulatory Visit: Payer: Self-pay | Admitting: Family Medicine

## 2023-02-05 NOTE — Progress Notes (Unsigned)
Referring Provider: Babs Sciara, MD Primary Care Physician:  Babs Sciara, MD Primary GI Physician: Dr. Marletta Lor  No chief complaint on file.   HPI:   [name] is a 57 y.o. female presenting today for follow-up of rectal bleeding s/p colonoscopy.  Last seen in our office 09/07/2022 at the request of Dr. Gerda Diss for Hemoccult positive stool.  Patient reported daily bowel movements.  Sometimes, if hemorrhoid was flaring, she would see bright red blood per rectum on toilet tissue.  No other significant GI symptoms.  She had never had a colonoscopy.  Recommended updating CBC and proceeding with a colonoscopy.  Hemoglobin on 6/13 was 13.3.  Colonoscopy 11/18/2022 with hemorrhoids, diverticulosis in the sigmoid colon, three 5-7 mm polyps resected and retrieved from the transverse colon, three 4-6 mm polyps resected and retrieved from the sigmoid colon.  Pathology with tubular adenomas and 1 hyperplastic polyp.  Recommended 5-year surveillance.  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   IBS (irritable bowel syndrome)    Seizures (HCC) 10/23/14   x 2   Shingles 11/12/14   left eye    Past Surgical History:  Procedure Laterality Date   ABDOMINAL HYSTERECTOMY     partial 2010-Dr.Ferguson   COLONOSCOPY WITH PROPOFOL N/A 11/18/2022   Procedure: COLONOSCOPY WITH PROPOFOL;  Surgeon: Lanelle Bal, DO;  Location: [facility];  Service: Endoscopy;  Laterality: N/A;  8:00 AM, ASA 2   I & D EXTREMITY Left 07/17/2014   Procedure: IRRIGATION AND DEBRIDEMENT left index finger;  Surgeon: Betha Loa, MD;  Location: [facility] OR;  Service: Orthopedics;  Laterality: Left;   NERVE, TENDON AND ARTERY REPAIR Left 07/17/2014   Procedure: NERVE, TENDON AND ARTERY REPAIR LEFT INDEX FINGER;  Surgeon: Betha Loa, MD;  Location: [facility] OR;  Service: Orthopedics;   Laterality: Left;   PARTIAL HYSTERECTOMY  2006   POLYPECTOMY  11/18/2022   Procedure: POLYPECTOMY;  Surgeon: Lanelle Bal, DO;  Location: [facility];  Service: Endoscopy;;   TUBAL LIGATION  1988    Current Outpatient Medications  Medication Sig Dispense Refill   levETIRAcetam (KEPPRA) 500 MG tablet Take 1 tablet (500 mg total) by mouth 2 (two) times daily. 60 tablet 6   mirtazapine (REMERON) 15 MG tablet Take 1 tablet (15 mg total) by mouth at bedtime. 30 tablet 5   No current facility-administered medications for this visit.    Allergies as of 02/08/2023 - Review Complete 11/24/2022  Allergen Reaction Noted   Cephalexin  10/23/2017   Effexor [venlafaxine hcl] Other (See Comments) 07/31/2012   Ampicillin Rash 01/21/2011   Neomycin Rash 06/26/2012   Penicillins Rash 07/31/2012    Family History  Problem Relation Age of Onset   Hypertension Sister    Depression Sister    Drug abuse Sister    Hypertension Mother    Osteoporosis Mother    Diabetes Mother    Alcohol abuse Mother    Depression Mother    Heart disease Father        MI   Depression Father    Depression Sister    Multiple sclerosis Sister    Heart attack Sister    Cancer - Lung Maternal Aunt    Leukemia Maternal Aunt    Colon cancer Neg Hx  Social History   Socioeconomic History   Marital status: Divorced    Spouse name: [name]   Number of children: 1   Years of education: 16   Highest education level: Not on file  Occupational History   Occupation: home, yard maintenance    Comment: self employed  Tobacco Use   Smoking status: Every Day    Current packs/day: 0.50    Average packs/day: 0.5 packs/day for 30.0 years (15.0 ttl pk-yrs)    Types: Cigarettes   Smokeless tobacco: Never  Vaping Use   Vaping status: Never Used  Substance and Sexual Activity   Alcohol use: Yes    Comment: occasionally   Drug use: Not Currently    Types: Marijuana   Sexual activity: Yes    Birth  control/protection: Surgical    Comment: tubal and hyst  Other Topics Concern   Not on file  Social History Narrative   Lives at home with husband   Caffeine use- coffee, sodas all day   Social Determinants of Health   Financial Resource Strain: Patient Declined (07/18/2022)   Overall Financial Resource Strain (CARDIA)    Difficulty of Paying Living Expenses: Patient declined  Food Insecurity: Patient Declined (07/18/2022)   Hunger Vital Sign    Worried About Running Out of Food in the Last Year: Patient declined    Ran Out of Food in the Last Year: Patient declined  Transportation Needs: Patient Declined (07/18/2022)   PRAPARE - Administrator, Civil Service (Medical): Patient declined    Lack of Transportation (Non-Medical): Patient declined  Physical Activity: Unknown (07/18/2022)   Exercise Vital Sign    Days of Exercise per Week: 5 days    Minutes of Exercise per Session: Patient declined  Stress: Patient Declined (07/18/2022)   [employer] of Occupational Health - Occupational Stress Questionnaire    Feeling of Stress : Patient declined  Social Connections: Unknown (07/18/2022)   Social Connection and Isolation Panel [NHANES]    Frequency of Communication with Friends and Family: Patient declined    Frequency of Social Gatherings with Friends and Family: Patient declined    Attends Religious Services: Patient declined    Database administrator or Organizations: Patient declined    Attends Engineer, structural: Not on file    Marital Status: Patient declined    Review of Systems: Gen: Denies fever, chills, anorexia. Denies fatigue, weakness, weight loss.  CV: Denies chest pain, palpitations, syncope, peripheral edema, and claudication. Resp: Denies dyspnea at rest, cough, wheezing, coughing up blood, and pleurisy. GI: Denies vomiting blood, jaundice, and fecal incontinence.   Denies dysphagia or odynophagia. Derm: Denies rash, itching, dry skin Psych:  Denies depression, anxiety, memory loss, confusion. No homicidal or suicidal ideation.  Heme: Denies bruising, bleeding, and enlarged lymph nodes.  Physical Exam: There were no vitals taken for this visit. General:   Alert and oriented. No distress noted. Pleasant and cooperative.  Head:  Normocephalic and atraumatic. Eyes:  Conjuctiva clear without scleral icterus. Heart:  S1, S2 present without murmurs appreciated. Lungs:  Clear to auscultation bilaterally. No wheezes, rales, or rhonchi. No distress.  Abdomen:  +BS, soft, non-tender and non-distended. No rebound or guarding. No HSM or masses noted. Msk:  Symmetrical without gross deformities. Normal posture. Extremities:  Without edema. Neurologic:  Alert and  oriented x4 Psych:  Normal mood and affect.    Assessment:     Plan:  ***   Ermalinda Memos, PA-C [facility] 02/08/2023

## 2023-02-08 ENCOUNTER — Ambulatory Visit (INDEPENDENT_AMBULATORY_CARE_PROVIDER_SITE_OTHER): Payer: Self-pay | Admitting: Gastroenterology

## 2023-02-08 ENCOUNTER — Encounter: Payer: Self-pay | Admitting: Gastroenterology

## 2023-02-08 VITALS — BP 112/72 | HR 73 | Temp 97.6°F | Ht 67.0 in | Wt 123.2 lb

## 2023-02-08 DIAGNOSIS — K644 Residual hemorrhoidal skin tags: Secondary | ICD-10-CM

## 2023-02-08 DIAGNOSIS — K649 Unspecified hemorrhoids: Secondary | ICD-10-CM

## 2023-02-08 DIAGNOSIS — Z860101 Personal history of adenomatous and serrated colon polyps: Secondary | ICD-10-CM

## 2023-02-08 DIAGNOSIS — Z860100 Personal history of colon polyps, unspecified: Secondary | ICD-10-CM

## 2023-02-08 DIAGNOSIS — K648 Other hemorrhoids: Secondary | ICD-10-CM

## 2023-02-08 MED ORDER — HYDROCORTISONE (PERIANAL) 2.5 % EX CREA: 1.0000 | TOPICAL_CREAM | Freq: Two times a day (BID) | CUTANEOUS | 1 refills | Status: DC

## 2023-02-08 NOTE — Patient Instructions (Addendum)
Please start Anusol rectal cream twice daily for the next 7-10 days for hemorrhoids. After 7-10 days, stop. You can use the cream again as needed for recurrent hemorrhoid symptoms.   If you decide you are interested in hemorrhoid banding, please let me know and we can get you set up with Brooke Bonito, NP or Lewie Loron, NP here at our office.   We will see you back as needed.   Ermalinda Memos, PA-C [facility]

## 2023-03-14 ENCOUNTER — Other Ambulatory Visit: Payer: Self-pay | Admitting: Family Medicine

## 2023-03-22 ENCOUNTER — Other Ambulatory Visit: Payer: Self-pay

## 2023-03-22 ENCOUNTER — Encounter: Payer: Self-pay | Admitting: Family Medicine

## 2023-03-22 DIAGNOSIS — L600 Ingrowing nail: Secondary | ICD-10-CM

## 2023-03-22 NOTE — Telephone Encounter (Signed)
Nurses I would recommend podiatry referral If the ingrown toenail is infected we would recommend a short course of antibiotics (Obviously let me know) Please communicate with [name] to help with referral

## 2023-06-01 ENCOUNTER — Encounter: Payer: Self-pay | Admitting: Family Medicine

## 2023-06-01 ENCOUNTER — Ambulatory Visit: Payer: Self-pay | Admitting: Family Medicine

## 2023-06-01 VITALS — BP 98/61 | HR 79 | Temp 97.3°F | Ht 67.0 in | Wt 122.0 lb

## 2023-06-01 DIAGNOSIS — F419 Anxiety disorder, unspecified: Secondary | ICD-10-CM

## 2023-06-01 DIAGNOSIS — G40909 Epilepsy, unspecified, not intractable, without status epilepticus: Secondary | ICD-10-CM

## 2023-06-01 DIAGNOSIS — R569 Unspecified convulsions: Secondary | ICD-10-CM

## 2023-06-01 DIAGNOSIS — F338 Other recurrent depressive disorders: Secondary | ICD-10-CM

## 2023-06-01 MED ORDER — MIRTAZAPINE 15 MG PO TABS: 15.0000 mg | ORAL_TABLET | Freq: Every day | ORAL | 5 refills | Status: DC

## 2023-06-01 MED ORDER — ALPRAZOLAM 0.5 MG PO TABS: ORAL_TABLET | ORAL | 0 refills | Status: DC

## 2023-06-01 MED ORDER — LEVETIRACETAM 500 MG PO TABS
500.0000 mg | ORAL_TABLET | Freq: Two times a day (BID) | ORAL | 6 refills | Status: DC
Start: 2023-06-01 — End: 2023-11-29

## 2023-06-01 NOTE — Progress Notes (Signed)
   Subjective:    Patient ID: [name], female    DOB: 1966-01-18, 58 y.o.   MRN: [id number]  HPI  6 month follow self pay Refills to be sent to [facility]  Would like to get back on xanax for the winter months      06/01/2023    2:55 PM 11/24/2022   11:11 AM 07/21/2022   10:18 AM  PHQ9 SCORE ONLY  PHQ-9 Total Score 3 5 11    Patient here for follow-up She does have depression and anxiety States has been under fairly good control currently Has been on mirtazapine for depression and moods per Dr. Tenny Craw She no longer sees psychiatry and does not want to go back there She does state that she gets a fair amount of seasonal affective disorder but is not suicidal She tries to tolerate things as best she can She also relates she would like to be able to have Xanax when she has panic attacks which occur occasionally She also states occasionally if she feels stressed out needs alprazolam for occasional use We did discuss safe use of it not to be a daily medicine never with heavy machinery or driving Review of Systems     Objective:   Physical Exam  General-in no acute distress Eyes-no discharge Lungs-respiratory rate normal, CTA CV-no murmurs,RRR Extremities skin warm dry no edema Neuro grossly normal Behavior normal, alert  Patient denies any seizures currently     Assessment & Plan:   Patient defers on preventative health I encouraged her to get insurance through the healthcare collective but she does not want to do that Continue current medications Refills sent in Depression stable currently Moods are doing well Xanax for sparing use Follow-up in 6 months sooner problems Patient encouraged to quit smoking

## 2023-11-29 ENCOUNTER — Ambulatory Visit: Payer: Self-pay | Admitting: Family Medicine

## 2023-11-29 VITALS — BP 100/66 | HR 67 | Temp 98.4°F | Ht 67.0 in | Wt 123.6 lb

## 2023-11-29 DIAGNOSIS — Z79899 Other long term (current) drug therapy: Secondary | ICD-10-CM

## 2023-11-29 DIAGNOSIS — F325 Major depressive disorder, single episode, in full remission: Secondary | ICD-10-CM

## 2023-11-29 DIAGNOSIS — G40909 Epilepsy, unspecified, not intractable, without status epilepticus: Secondary | ICD-10-CM

## 2023-11-29 DIAGNOSIS — E785 Hyperlipidemia, unspecified: Secondary | ICD-10-CM

## 2023-11-29 MED ORDER — LEVETIRACETAM 500 MG PO TABS
500.0000 mg | ORAL_TABLET | Freq: Two times a day (BID) | ORAL | 6 refills | Status: AC
Start: 2023-11-29 — End: ?

## 2023-11-29 MED ORDER — MIRTAZAPINE 7.5 MG PO TABS: 7.5000 mg | ORAL_TABLET | Freq: Every day | ORAL | 5 refills | Status: AC

## 2023-11-29 NOTE — Progress Notes (Signed)
   Subjective:    Patient ID: [name], female    DOB: 01/11/1966, 58 y.o.   MRN: [id number]  HPI Here today for follow-up Has not had any seizures She is trying to stay healthy with her eating habits and activity Planning on training for truck driving She relates she has not had any seizures for a long time Tolerating medicine well Compliant with the medicine States her alertness is doing well  Patient denies any health issues currently Moods are doing well she would like to reduce mirtazapine  if possible   Review of Systems     Objective:   Physical Exam General-in no acute distress Eyes-no discharge Lungs-respiratory rate normal, CTA CV-no murmurs,RRR Extremities skin warm dry no edema Neuro grossly normal Behavior normal, alert      11/29/2023   11:02 AM 06/01/2023    2:55 PM 11/24/2022   11:11 AM 07/21/2022   10:18 AM 01/05/2022    3:05 PM  Depression screen PHQ 2/9  Decreased Interest 0 1 1 2    Down, Depressed, Hopeless 0 1 1 2    PHQ - 2 Score 0 2 2 4    Altered sleeping 0 0 1 2   Tired, decreased energy 0 0 1 2   Change in appetite 0 0 1 2   Feeling bad or failure about yourself  0 0 0 1   Trouble concentrating 0 1 0 0   Moving slowly or fidgety/restless 0 0 0 0   Suicidal thoughts 0 0 0 0   PHQ-9 Score 0 3 5 11    Difficult doing work/chores  Somewhat difficult Somewhat difficult Somewhat difficult      Information is confidential and restricted. Go to Review Flowsheets to unlock data.        Assessment & Plan:   1. Hyperlipidemia, unspecified hyperlipidemia type (Primary) Healthy eating regular physical activity check lab work - Lipid Panel - Basic Metabolic Panel  2. 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  Dispense: 60 tablet; Refill: 6 - Levetiracetam  level  3. High risk medication  use Labs ordered await results - Basic Metabolic Panel - Hepatic Function Panel  4. Major depression in remission (HCC) Reduce mirtazapine  to 7.5 mg follow-up if she is doing well we will have a discussion whether or not to discontinue medication

## 2023-12-06 ENCOUNTER — Ambulatory Visit: Payer: Self-pay | Admitting: Family Medicine

## 2023-12-06 LAB — LIPID PANEL
Chol/HDL Ratio: 4.1 ratio (ref 0.0–4.4)
Cholesterol, Total: 214 mg/dL — ABNORMAL HIGH (ref 100–199)
HDL: 52 mg/dL (ref >39)
LDL Chol Calc (NIH): 147 mg/dL — ABNORMAL HIGH (ref 0–99)
Triglycerides: 85 mg/dL (ref 0–149)
VLDL Cholesterol Cal: 15 mg/dL (ref 5–40)

## 2023-12-06 LAB — HEPATIC FUNCTION PANEL
ALT: 11 IU/L (ref 0–32)
AST: 16 IU/L (ref 0–40)
Albumin: 4.4 g/dL (ref 3.8–4.9)
Alkaline Phosphatase: 68 IU/L (ref 44–121)
Bilirubin Total: 0.4 mg/dL (ref 0.0–1.2)
Bilirubin, Direct: 0.12 mg/dL (ref 0.00–0.40)
Total Protein: 6.5 g/dL (ref 6.0–8.5)

## 2023-12-06 LAB — BASIC METABOLIC PANEL WITH GFR
BUN/Creatinine Ratio: 17 (ref 9–23)
BUN: 16 mg/dL (ref 6–24)
CO2: 23 mmol/L (ref 20–29)
Calcium: 10 mg/dL (ref 8.7–10.2)
Chloride: 104 mmol/L (ref 96–106)
Creatinine, Ser: 0.93 mg/dL (ref 0.57–1.00)
Glucose: 93 mg/dL (ref 70–99)
Potassium: 5 mmol/L (ref 3.5–5.2)
Sodium: 140 mmol/L (ref 134–144)
eGFR: 71 mL/min/1.73 (ref >59)

## 2023-12-06 LAB — LEVETIRACETAM LEVEL: Levetiracetam Lvl: 15.4 ug/mL (ref 10.0–40.0)

## 2023-12-07 ENCOUNTER — Other Ambulatory Visit: Payer: Self-pay

## 2023-12-07 DIAGNOSIS — E785 Hyperlipidemia, unspecified: Secondary | ICD-10-CM

## 2024-02-22 ENCOUNTER — Other Ambulatory Visit: Payer: Self-pay | Admitting: Family Medicine

## 2024-02-22 ENCOUNTER — Ambulatory Visit: Payer: Self-pay
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
## 2024-03-19 ENCOUNTER — Ambulatory Visit: Payer: Self-pay | Admitting: Nurse Practitioner

## 2024-03-19 ENCOUNTER — Ambulatory Visit: Payer: Self-pay

## 2024-03-19 ENCOUNTER — Encounter: Payer: Self-pay | Admitting: Nurse Practitioner

## 2024-03-19 ENCOUNTER — Ambulatory Visit (HOSPITAL_COMMUNITY)
Admission: RE | Admit: 2024-03-19 | Discharge: 2024-03-19 | Disposition: A | Payer: Self-pay | Source: Ambulatory Visit | Attending: Nurse Practitioner | Admitting: Nurse Practitioner

## 2024-03-19 VITALS — BP 120/78 | HR 78 | Temp 98.6°F | Ht 67.0 in | Wt 119.0 lb

## 2024-03-19 DIAGNOSIS — K582 Mixed irritable bowel syndrome: Secondary | ICD-10-CM

## 2024-03-19 DIAGNOSIS — R1031 Right lower quadrant pain: Secondary | ICD-10-CM

## 2024-03-19 DIAGNOSIS — R1030 Lower abdominal pain, unspecified: Secondary | ICD-10-CM

## 2024-03-19 DIAGNOSIS — K219 Gastro-esophageal reflux disease without esophagitis: Secondary | ICD-10-CM

## 2024-03-19 DIAGNOSIS — R300 Dysuria: Secondary | ICD-10-CM

## 2024-03-19 LAB — POCT URINALYSIS DIP (CLINITEK)
Bilirubin, UA: NEGATIVE
Blood, UA: NEGATIVE
Glucose, UA: NEGATIVE mg/dL
Ketones, POC UA: NEGATIVE mg/dL
Leukocytes, UA: NEGATIVE
Nitrite, UA: NEGATIVE
POC PROTEIN,UA: NEGATIVE
Spec Grav, UA: 1.01 (ref 1.010–1.025)
Urobilinogen, UA: 0.2 U/dL
pH, UA: 5 (ref 5.0–8.0)

## 2024-03-19 MED ORDER — PANTOPRAZOLE SODIUM 40 MG PO TBEC: 40.0000 mg | DELAYED_RELEASE_TABLET | Freq: Every day | ORAL | 2 refills | Status: DC

## 2024-03-19 NOTE — Telephone Encounter (Signed)
 FYI Only or Action Required?: FYI only for provider: appointment scheduled on 03/19/2024.  Patient was last seen in primary care on 11/29/2023 by Alphonsa Glendia LABOR, MD.  Called Nurse Triage reporting Pain.  Symptoms began a week ago.  Interventions attempted: OTC medications: laxative.  Symptoms are: unchanged.  Triage Disposition: See HCP Within 4 Hours (Or PCP Triage)  Patient/caregiver understands and will follow disposition?: Yes   Copied from CRM #[id number]. Topic: Clinical - Red Word Triage >> Mar 19, 2024  7:56 AM Ivette P wrote: Red Word that prompted transfer to Nurse Triage:  Pain issues right - sharp pain in side. took laxitives. had bowel movements but still have pain.  had pain for a while - started last week. got critical. Reason for Disposition  [1] MILD-MODERATE pain AND [2] constant AND [3] present > 2 hours  Answer Assessment - Initial Assessment Questions 1. LOCATION: Where does it hurt?      Right side near hip area and sometimes  2. RADIATION: Does the pain shoot anywhere else? (e.g., chest, back)     radiates to back 3. ONSET: When did the pain begin? (e.g., minutes, hours or days ago)      X week and worsening Friday 4. SUDDEN: Gradual or sudden onset?     gradual 5. PATTERN Does the pain come and go, or is it constant?     constant 6. SEVERITY: How bad is the pain?  (e.g., Scale 1-10; mild, moderate, or severe)     Was a 10/10 yesterday before bowel movement but today 5/10 7. RECURRENT SYMPTOM: Have you ever had this type of stomach pain before? If Yes, ask: When was the last time? and What happened that time?      no 8. CAUSE: What do you think is causing the stomach pain? (e.g., gallstones, recent abdominal surgery)     na 9. RELIEVING/AGGRAVATING FACTORS: What makes it better or worse? (e.g., antacids, bending or twisting motion, bowel movement)     na 10. OTHER SYMPTOMS: Do you have any other symptoms? (e.g., back pain,  diarrhea, fever, urination pain, vomiting)       Hot and cold flashes, back pain, increased urinary frequency, dark urine, was having stools now constipation 11. PREGNANCY: Is there any chance you are pregnant? When was your last menstrual period?       Na  Pt states stomach seems like its hard  Protocols used: Abdominal Pain - Female-A-AH

## 2024-03-19 NOTE — Patient Instructions (Addendum)
 [email or web address]   GERD in Adults: Diet Changes When you have gastroesophageal reflux disease (GERD), you may need to make changes to your diet. Choosing the right foods can help with your symptoms. Think about working with an expert in healthy eating called a dietitian. They can help you make healthy food choices. What are tips for following this plan? Reading food labels Look for foods that are low in saturated fat. Foods that may help with your symptoms include: Foods with less than 5% of daily value (DV) of fat. Foods with 0 grams of trans fat. Cooking [employer] in ways that don't use a lot of fat. These ways include: Baking. Steaming. Grilling. Broiling. To add flavor, try to use herbs that are low in spice and acidity. Avoid frying your food. Meal planning  Eat small meals often rather than eating 3 large meals each day. Eat your meals slowly in a place where you feel relaxed. If told by your health care provider, avoid: Foods that cause symptoms. Keep a food diary to keep track of foods that cause symptoms. Alcohol. Drinking a lot of liquid with meals. General instructions For 2-3 hours after you eat, avoid: Bending over. Exercise. Lying down. Chew sugar-free gum after meals. What foods should I eat? Eat a healthy diet. Try to include: Foods with high amounts of fiber. These include: Fruits and vegetables. Whole grains and beans. Low-fat dairy products. Lean meats, fish, and poultry. Egg whites. Foods that cause symptoms in someone else may not cause symptoms for you. Work with your provider to find foods that are safe for you. The items listed above may not be all the foods and drinks you can have. Talk with a dietitian to learn more. The items listed above may not be a complete list of foods and beverages you can eat and drink. Contact a dietitian for more information. What foods should I avoid? Limiting some of these foods may help with your symptoms.  Each person is different. Talk with a dietitian or your provider to help you find the exact foods to avoid. Some of the foods to avoid may include: Fruits Fruits with a lot of acid in them. These may include citrus fruits, such as oranges, grapefruit, pineapple, and lemons. Vegetables Deep-fried vegetables, such as French fries. Vegetables, sauces, or toppings made with added fat and vegetables with acid in them. These may include tomatoes and tomato products, chili peppers, onions, garlic, and horseradish. Grains Pastries or quick breads with added fat. Meats and other proteins High-fat meats, such as fatty beef or pork, hot dogs, ribs, ham, sausage, salami, and bacon. Fried meat or protein, such as fried fish and fried chicken. Egg yolks. Fats and oils Butter. Margarine. Shortening. Ghee. Drinks Coffee and other drinks with caffeine in them. Fizzy and sugary drinks, such as soda and energy drinks. Fruit juice made with acidic fruits, such as orange or grapefruit. Tomato juice. Sweets and desserts Chocolate and cocoa. Donuts. Seasonings and condiments Mint, such as peppermint and spearmint. Condiments, herbs, or seasonings that cause symptoms. These may include curry, hot sauce, or vinegar-based salad dressings. The items listed above may not be all the foods and drinks you should avoid. Talk with a dietitian to learn more. Questions to ask your health care provider Changes to your diet and everyday life are often the first steps taken to manage symptoms of GERD. If these changes don't help, talk with your provider about taking medicines. Where to find more information [employer]  [employer]: aboutgerd.org This information is not intended to replace advice given to you by your health care provider. Make sure you discuss any questions you have with your health care provider. Document Revised: 02/07/2023 Document Reviewed: 08/24/2022 Elsevier Patient  Education  2024 [employer].

## 2024-03-20 ENCOUNTER — Encounter (HOSPITAL_COMMUNITY): Payer: Self-pay | Admitting: *Deleted

## 2024-03-20 ENCOUNTER — Encounter: Payer: Self-pay | Admitting: Nurse Practitioner

## 2024-03-20 ENCOUNTER — Other Ambulatory Visit: Payer: Self-pay

## 2024-03-20 ENCOUNTER — Emergency Department (HOSPITAL_COMMUNITY)
Admission: EM | Admit: 2024-03-20 | Discharge: 2024-03-20 | Disposition: A | Discharge status: Home / Self Care | Payer: Self-pay | Attending: Emergency Medicine | Admitting: Emergency Medicine

## 2024-03-20 ENCOUNTER — Emergency Department (HOSPITAL_COMMUNITY): Payer: Self-pay

## 2024-03-20 DIAGNOSIS — R1031 Right lower quadrant pain: Secondary | ICD-10-CM | POA: Insufficient documentation

## 2024-03-20 DIAGNOSIS — K5900 Constipation, unspecified: Secondary | ICD-10-CM | POA: Insufficient documentation

## 2024-03-20 DIAGNOSIS — R1030 Lower abdominal pain, unspecified: Secondary | ICD-10-CM

## 2024-03-20 LAB — CBC WITH DIFFERENTIAL/PLATELET
Abs Immature Granulocytes: 0.02 K/uL (ref 0.00–0.07)
Basophils Absolute: 0 K/uL (ref 0.0–0.1)
Basophils Relative: 0 %
Eosinophils Absolute: 0.1 K/uL (ref 0.0–0.5)
Eosinophils Relative: 1 %
HCT: 40 % (ref 36.0–46.0)
Hemoglobin: 13 g/dL (ref 12.0–15.0)
Immature Granulocytes: 0 %
Lymphocytes Relative: 26 %
Lymphs Abs: 1.8 K/uL (ref 0.7–4.0)
MCH: 31.6 pg (ref 26.0–34.0)
MCHC: 32.5 g/dL (ref 30.0–36.0)
MCV: 97.3 fL (ref 80.0–100.0)
Monocytes Absolute: 0.7 K/uL (ref 0.1–1.0)
Monocytes Relative: 10 %
Neutro Abs: 4.3 K/uL (ref 1.7–7.7)
Neutrophils Relative %: 63 %
Platelets: 233 K/uL (ref 150–400)
RBC: 4.11 MIL/uL (ref 3.87–5.11)
RDW: 11.9 % (ref 11.5–15.5)
WBC: 6.9 K/uL (ref 4.0–10.5)
nRBC: 0 % (ref 0.0–0.2)

## 2024-03-20 LAB — COMPREHENSIVE METABOLIC PANEL WITH GFR
ALT: 10 U/L (ref 0–44)
AST: 20 U/L (ref 15–41)
Albumin: 4.7 g/dL (ref 3.5–5.0)
Alkaline Phosphatase: 61 U/L (ref 38–126)
Anion gap: 12 (ref 5–15)
BUN: 14 mg/dL (ref 6–20)
CO2: 22 mmol/L (ref 22–32)
Calcium: 9.8 mg/dL (ref 8.9–10.3)
Chloride: 103 mmol/L (ref 98–111)
Creatinine, Ser: 0.81 mg/dL (ref 0.44–1.00)
GFR, Estimated: >60 mL/min (ref >60)
Glucose, Bld: 99 mg/dL (ref 70–99)
Potassium: 4.1 mmol/L (ref 3.5–5.1)
Sodium: 137 mmol/L (ref 135–145)
Total Bilirubin: 0.3 mg/dL (ref 0.0–1.2)
Total Protein: 7 g/dL (ref 6.5–8.1)

## 2024-03-20 LAB — URINALYSIS, ROUTINE W REFLEX MICROSCOPIC
Bilirubin Urine: NEGATIVE
Glucose, UA: NEGATIVE mg/dL
Hgb urine dipstick: NEGATIVE
Ketones, ur: NEGATIVE mg/dL
Nitrite: NEGATIVE
Protein, ur: NEGATIVE mg/dL
Specific Gravity, Urine: 1.006 (ref 1.005–1.030)
pH: 5 (ref 5.0–8.0)

## 2024-03-20 LAB — LIPASE, BLOOD: Lipase: 59 U/L — ABNORMAL HIGH (ref 11–51)

## 2024-03-20 MED ORDER — IOHEXOL 300 MG/ML  SOLN
100.0000 mL | Freq: Once | INTRAMUSCULAR | Status: AC | PRN
  Administered 2024-03-20: 100 mL via INTRAVENOUS

## 2024-03-20 MED ORDER — MORPHINE SULFATE (PF) 4 MG/ML IV SOLN
4.0000 mg | INTRAVENOUS | Status: DC | PRN
  Filled 2024-03-20: qty 1

## 2024-03-20 NOTE — ED Notes (Signed)
 Assisted to restroom.
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
## 2024-03-20 NOTE — Discharge Instructions (Signed)
 Please take 1 scoop of MiraLAX daily until you are having regular soft stools.  It appears that you still have a large amount of stool on the right side of your colon which is likely causing your pain.  There is nothing else on your CAT scan or blood work that was abnormal.  Return if you have severe worsening symptoms

## 2024-03-20 NOTE — ED Notes (Signed)
 Pt states she doesn't want any narcotics.

## 2024-03-20 NOTE — ED Triage Notes (Signed)
 Pt c/o right lower abdominal pain that started x 6 days ago  Pt has been taking laxatives with results thinking she may be constipated  Pt denies any n/v

## 2024-03-20 NOTE — ED Provider Notes (Signed)
 [place] EMERGENCY DEPARTMENT AT [facility] Provider Note   CSN: [id number] Arrival date & time: 03/20/24  9288     Patient presents with: Abdominal Pain   [name] is a 58 y.o. female.    Abdominal Pain    The patient is a 58 year old female, she takes Keppra , Remeron , Protonix  and Xanax , she reports about 3 days of having some abdominal pain mostly in the right lower quadrant, felt like she might be constipated so she took a laxative, she had a couple bowel movements but it is not really helped the pain.  She saw her family doctor yesterday and was sent for an outpatient x-ray as well as a urinalysis both of which were normal.  The pain is worse today, she was encouraged to come to the ER for evaluation.  She does have a history of a prior partial hysterectomy as well as a tubal ligation but no other abdominal surgery.  No fevers, no chills, no nausea or vomiting.  Prior to Admission medications   Medication Sig Start Date End Date Taking? Authorizing Provider  ALPRAZolam  (XANAX ) 0.5 MG tablet TAKE ONE-HALF TO ONE TABLET TWICE DAILY FOR ANXIETY. CAUTION DROWSINESS, USE SPARINGLY 02/22/24   Luking, Glendia LABOR, MD  levETIRAcetam  (KEPPRA ) 500 MG tablet Take 1 tablet (500 mg total) by mouth 2 (two) times daily. 11/29/23   Alphonsa Glendia LABOR, MD  mirtazapine  (REMERON ) 7.5 MG tablet Take 1 tablet (7.5 mg total) by mouth at bedtime. 11/29/23   Alphonsa Glendia LABOR, MD  pantoprazole  (PROTONIX ) 40 MG tablet Take 1 tablet (40 mg total) by mouth daily. For 2 weeks for acid reflux then prn 03/19/24   Mauro Elveria BROCKS, NP    Allergies: Cephalexin , Effexor [venlafaxine hcl], Ampicillin, Neomycin, and Penicillins    Review of Systems  Gastrointestinal:  Positive for abdominal pain.  All other systems reviewed and are negative.   Updated Vital Signs BP 112/65   Pulse 62   Temp 98.7 F (37.1 C) (Oral)   Resp 17   Ht 1.702 m (5' 7)   Wt 54 kg   SpO2 98%   BMI 18.64 kg/m    Physical Exam Vitals and nursing note reviewed.  Constitutional:      General: She is not in acute distress.    Appearance: She is well-developed.  HENT:     Head: Normocephalic and atraumatic.     Mouth/Throat:     Pharynx: No oropharyngeal exudate.  Eyes:     General: No scleral icterus.       Right eye: No discharge.        Left eye: No discharge.     Conjunctiva/sclera: Conjunctivae normal.     Pupils: Pupils are equal, round, and reactive to light.  Neck:     Thyroid : No thyromegaly.     Vascular: No JVD.  Cardiovascular:     Rate and Rhythm: Normal rate and regular rhythm.     Heart sounds: Normal heart sounds. No murmur heard.    No friction rub. No gallop.  Pulmonary:     Effort: Pulmonary effort is normal. No respiratory distress.     Breath sounds: Normal breath sounds. No wheezing or rales.  Abdominal:     General: Bowel sounds are normal. There is no distension.     Palpations: Abdomen is soft. There is no mass.     Tenderness: There is abdominal tenderness.     Comments: Bowel sounds are unremarkable, there is focal  tenderness in the lower abdomen especially on the right but also in the right upper quadrant in the left lower quadrant.  No guarding or peritoneal signs  Musculoskeletal:        General: No tenderness. Normal range of motion.     Cervical back: Normal range of motion and neck supple.     Right lower leg: No edema.     Left lower leg: No edema.  Lymphadenopathy:     Cervical: No cervical adenopathy.  Skin:    General: Skin is warm and dry.     Findings: No erythema or rash.  Neurological:     Mental Status: She is alert.     Coordination: Coordination normal.  Psychiatric:        Behavior: Behavior normal.     (all labs ordered are listed, but only abnormal results are displayed) Labs Reviewed  LIPASE, BLOOD - Abnormal; Notable for the following components:      Result Value   Lipase 59 (*)    All other components within normal limits   URINALYSIS, ROUTINE W REFLEX MICROSCOPIC - Abnormal; Notable for the following components:   Leukocytes,Ua TRACE (*)    Bacteria, UA RARE (*)    All other components within normal limits  CBC WITH DIFFERENTIAL/PLATELET  COMPREHENSIVE METABOLIC PANEL WITH GFR    EKG: None  Radiology: CT ABDOMEN PELVIS W CONTRAST Result Date: 03/20/2024 CLINICAL DATA:  Right-sided abdominal pain. EXAM: CT ABDOMEN AND PELVIS WITH CONTRAST TECHNIQUE: Multidetector CT imaging of the abdomen and pelvis was performed using the standard protocol following bolus administration of intravenous contrast. RADIATION DOSE REDUCTION: This exam was performed according to the departmental dose-optimization program which includes automated exposure control, adjustment of the mA and/or kV according to patient size and/or use of iterative reconstruction technique. CONTRAST:  100mL OMNIPAQUE IOHEXOL 300 MG/ML  SOLN COMPARISON:  05/30/2017. FINDINGS: Lower chest: Calcified right lower lobe granuloma. Minimal dependent atelectasis. Heart is at the upper limits of normal in size. No pericardial or pleural effusion. Distal esophagus is grossly unremarkable. Hepatobiliary: Tiny low-attenuation lesion in the left hepatic lobe, likely benign. Liver and gallbladder are otherwise unremarkable. No biliary ductal dilatation. Pancreas: Negative. Spleen: Negative. Adrenals/Urinary Tract: Adrenal glands and right kidney are unremarkable. Tiny low-attenuation lesion in the left kidney, too small to characterize. No specific follow-up necessary. Ureters are decompressed. Bladder is grossly unremarkable. Stomach/Bowel: Stomach, small bowel and appendix are unremarkable. Stool is seen in the cecum and ascending colon with additional scattered stool elsewhere. Colon is otherwise unremarkable. Vascular/Lymphatic: Circumaortic left renal vein. Atherosclerotic calcification of the aorta. No pathologically enlarged lymph nodes. Reproductive: Hysterectomy.  No  adnexal mass. Other: No free fluid.  Mesenteries and peritoneum are unremarkable. Musculoskeletal: Degenerative changes in the spine. Mild levoconvex scoliosis. IMPRESSION: 1. No acute findings to explain the patient's pain. 2. Stool in the cecum and ascending colon may be due to constipation. 3.  Aortic atherosclerosis (ICD10-I70.0). Electronically Signed   By: Newell Eke M.D.   On: 03/20/2024 10:22   DG Abd 1 View Result Date: 03/19/2024 EXAM: 1 VIEW XRAY OF THE ABDOMEN 03/19/2024 11:56:00 AM COMPARISON: None available. CLINICAL HISTORY: abdominal pain FINDINGS: BOWEL: Nonobstructive bowel gas pattern. SOFT TISSUES: No abnormal calcifications. BONES: No acute fracture. IMPRESSION: 1. No acute abdominal abnormality identified. Electronically signed by: Camellia Candle MD 03/19/2024 12:22 PM EST RP Workstation: [id number]     Procedures   Medications Ordered in the ED  morphine  (PF) 4 MG/ML injection 4 mg (4  mg Intravenous Patient Refused/Not Given 03/20/24 0757)  iohexol (OMNIPAQUE) 300 MG/ML solution 100 mL (100 mLs Intravenous Contrast Given 03/20/24 9081)                                    Medical Decision Making Amount and/or Complexity of Data Reviewed Labs: ordered. Radiology: ordered.  Risk Prescription drug management.    This patient presents to the ED for concern of abdominal pain, this involves an extensive number of treatment options, and is a complaint that carries with it a high risk of complications and morbidity.  The differential diagnosis includes appendicitis, gallbladder disease, constipation, seems less likely to be kidney stone given the tenderness on my exam, no pulsating mass felt   Co morbidities / Chronic conditions that complicate the patient evaluation  None   Additional history obtained:  Additional history obtained from EMR External records from outside source obtained and reviewed including medical record including office visit   Lab  Tests:  I Ordered, and personally interpreted labs.  The pertinent results include: Urinalysis without hematuria or infection, CBC without leukocytosis or anemia, metabolic panel without kidney dysfunction, lipase was essentially normal  Imaging Studies ordered:  I ordered imaging studies including CT scan of the abdomen and pelvis I independently visualized and interpreted imaging which showed no explanation for pain but she does have large stool burden on the right I agree with the radiologist interpretation   Cardiac Monitoring: / EKG:  The patient was maintained on a cardiac monitor.  I personally viewed and interpreted the cardiac monitored which showed an underlying rhythm of: Sinus rhythm   Problem List / ED Course / Critical interventions / Medication management  Donnell pain, likely constipation  I have reviewed the patients home medicines and have made adjustments as needed   Consultations Obtained:  Outpatient follow-up with primary care  Social Determinants of Health:  None   Test / Admission - Considered:  Admission but no surgical findings found, patient hemodynamically stable  I have discussed with the patient at the bedside the results, and the meaning of these results.  They have had opportunity to ask questions,  expressed their understanding to the need for follow-up with primary care physician      Final diagnoses:  Constipation, unspecified constipation type  Lower abdominal pain    ED Discharge Orders     None          Cleotilde Rogue, MD 03/20/24 1045

## 2024-03-26 ENCOUNTER — Ambulatory Visit: Payer: Self-pay | Admitting: Family Medicine

## 2024-03-26 VITALS — BP 109/67 | Ht 67.0 in | Wt 121.6 lb

## 2024-03-26 DIAGNOSIS — I700 Atherosclerosis of aorta: Secondary | ICD-10-CM

## 2024-03-26 DIAGNOSIS — E785 Hyperlipidemia, unspecified: Secondary | ICD-10-CM

## 2024-03-26 DIAGNOSIS — G40909 Epilepsy, unspecified, not intractable, without status epilepticus: Secondary | ICD-10-CM

## 2024-03-26 NOTE — Progress Notes (Signed)
° °  Subjective:    Patient ID: [name], female    DOB: [birth year], 58 y.o.   MRN: [id number]  HPI Very nice patient Here today for follow-up from ER visit Not having any setbacks currently She had abdominal pain from constipation Use MiraLAX that helped a lot but then caused her stools to become too loose Then she backed off the MiraLAX Bowel movements are becoming more regular and firm No blood Abdominal discomfort not present Denies any reflux symptoms She requests that Protonix  be removed from her med list   Review of Systems     Objective:   Physical Exam 10 minutes spent with patient discussing findings of the CAT scan and her most recent office visits  Patient does have a wart on the side of her finger She is going to try Compound W I told her if that does not help over the next 4 to 6 weeks we would send her to dermatology-she is to let us  know     Assessment & Plan:  Abdominal pain resolved Constipation under good control Up-to-date on colonoscopy Patient states she will use MiraLAX if necessary otherwise just through diet alone keep her bowels regular along with plenty of water  Patient states she does not have reflux I agree with this Protonix  removed from her list She will let us  know if there is any other chart corrections that need to be taken into account  She does have aortic atherosclerosis on CAT scan I explained to her that puts her at increased risk for heart disease and stroke recommended to get LDL below 70.  Recommend to consider statin.  Patient states she will be working hard on her diet and check her lab somewhere in February or March.  She will do a follow-up later in the year April or May

## 2024-05-31 ENCOUNTER — Ambulatory Visit: Payer: Self-pay | Admitting: Family Medicine

## 2024-07-29 ENCOUNTER — Ambulatory Visit: Payer: Self-pay | Admitting: Family Medicine
